# Patient Record
Sex: Male | Born: 1940 | Race: White | Hispanic: No | Smoking: Never smoker
Health system: Southern US, Community
[De-identification: ages and names within clinical notes are randomized; demographics above are authoritative.]

## PROBLEM LIST (undated history)

## (undated) DIAGNOSIS — A419 Sepsis, unspecified organism: Secondary | ICD-10-CM

## (undated) DIAGNOSIS — G309 Alzheimer's disease, unspecified: Secondary | ICD-10-CM

## (undated) DIAGNOSIS — Z86718 Personal history of other venous thrombosis and embolism: Secondary | ICD-10-CM

## (undated) DIAGNOSIS — F0280 Dementia in other diseases classified elsewhere without behavioral disturbance: Secondary | ICD-10-CM

## (undated) DIAGNOSIS — I700 Atherosclerosis of aorta: Secondary | ICD-10-CM

## (undated) DIAGNOSIS — K810 Acute cholecystitis: Secondary | ICD-10-CM

## (undated) DIAGNOSIS — M1990 Unspecified osteoarthritis, unspecified site: Secondary | ICD-10-CM

## (undated) DIAGNOSIS — G9341 Metabolic encephalopathy: Secondary | ICD-10-CM

## (undated) DIAGNOSIS — I10 Essential (primary) hypertension: Secondary | ICD-10-CM

## (undated) DIAGNOSIS — F419 Anxiety disorder, unspecified: Secondary | ICD-10-CM

## (undated) DIAGNOSIS — N189 Chronic kidney disease, unspecified: Secondary | ICD-10-CM

## (undated) HISTORY — DX: Dementia in other diseases classified elsewhere, unspecified severity, without behavioral disturbance, psychotic disturbance, mood disturbance, and anxiety: F02.80

## (undated) HISTORY — PX: FRACTURE SURGERY: SHX138

## (undated) HISTORY — DX: Alzheimer's disease, unspecified: G30.9

---

## 1983-02-01 HISTORY — PX: NEPHRECTOMY: SHX65

## 2003-02-10 ENCOUNTER — Ambulatory Visit (HOSPITAL_COMMUNITY): Admission: RE | Admit: 2003-02-10 | Discharge: 2003-02-10 | Payer: Self-pay | Admitting: Gastroenterology

## 2003-02-10 ENCOUNTER — Encounter (INDEPENDENT_AMBULATORY_CARE_PROVIDER_SITE_OTHER): Payer: Self-pay | Admitting: Specialist

## 2003-11-24 ENCOUNTER — Ambulatory Visit (HOSPITAL_COMMUNITY): Admission: RE | Admit: 2003-11-24 | Discharge: 2003-11-24 | Payer: Self-pay | Admitting: Chiropractic Medicine

## 2005-06-15 ENCOUNTER — Encounter: Admission: RE | Admit: 2005-06-15 | Discharge: 2005-06-15 | Payer: Self-pay | Admitting: Specialist

## 2005-06-29 ENCOUNTER — Encounter: Admission: RE | Admit: 2005-06-29 | Discharge: 2005-06-29 | Payer: Self-pay | Admitting: Specialist

## 2005-07-18 ENCOUNTER — Encounter: Admission: RE | Admit: 2005-07-18 | Discharge: 2005-07-18 | Payer: Self-pay | Admitting: Specialist

## 2005-09-02 ENCOUNTER — Encounter: Admission: RE | Admit: 2005-09-02 | Discharge: 2005-09-02 | Payer: Self-pay | Admitting: Specialist

## 2008-09-01 ENCOUNTER — Encounter: Admission: RE | Admit: 2008-09-01 | Discharge: 2008-09-01 | Payer: Self-pay | Admitting: Chiropractic Medicine

## 2010-06-18 NOTE — Op Note (Signed)
NAME:  Robert Le, Robert Le                       ACCOUNT NO.:  [id number]   MEDICAL RECORD NO.:  [id number]                   PATIENT TYPE:  AMB   LOCATION:  ENDO                                 FACILITY:  [facility]   PHYSICIAN:  Danise Edge, M.D.                DATE OF BIRTH:  07-Sep-1940   DATE OF PROCEDURE:  02/10/2003  DATE OF DISCHARGE:                                 OPERATIVE REPORT   PROCEDURE:  Colonoscopy and polypectomy.   PROCEDURE INDICATION:  [name] is a 70 year old male scheduled  to undergo his first screening colonoscopy with polypectomy to prevent colon  cancer.   ENDOSCOPIST:  Danise Edge, M.D.   PREMEDICATION:  Versed 5 mg, Demerol 50 mg.   DESCRIPTION OF PROCEDURE:  After obtaining informed consent, Mr. Koral was  placed in the left lateral decubitus position.  I administered intravenous  Demerol and intravenous Versed to achieve conscious sedation for the  procedure.  The patient's oxygen saturation and cardiac rhythm were  monitored throughout the procedure and documented in the medical record.   Anal inspection was normal.  Digital rectal exam revealed a non-nodular  prostate.  The Olympus adjustable pediatric colonoscope was introduced into  the rectum and advanced to the cecum.  Colonic preparation for the exam  today was excellent.   Rectum normal.   Sigmoid colon and descending colon:  At approximately 65 cm from the anal  verge a 2 mm sessile polyp was removed with the electrocautery snare and  submitted for pathologic interpretation.   Splenic flexure normal.   Transverse colon normal.   Hepatic flexure normal.   Ascending colon normal.  Cecum and ileocecal valve normal.   Distal ileum normal.   ASSESSMENT:  A small polyp was removed from the left colon at approximately  65 cm from the anal verge; otherwise normal screening proctocolonoscopy to  the cecum.   RECOMMENDATIONS:  Repeat colonoscopy in five years if polyp  returns  neoplastic pathologically.                                               Danise Edge, M.D.    MJ/MEDQ  D:  02/10/2003  T:  02/10/2003  Job:  [id number]

## 2011-06-08 DIAGNOSIS — J309 Allergic rhinitis, unspecified: Secondary | ICD-10-CM | POA: Diagnosis not present

## 2011-07-25 DIAGNOSIS — Z1331 Encounter for screening for depression: Secondary | ICD-10-CM | POA: Diagnosis not present

## 2011-07-25 DIAGNOSIS — I10 Essential (primary) hypertension: Secondary | ICD-10-CM | POA: Diagnosis not present

## 2011-07-25 DIAGNOSIS — Z0000 Encounter for general adult medical examination without abnormal findings: Secondary | ICD-10-CM | POA: Diagnosis not present

## 2011-09-16 DIAGNOSIS — M62838 Other muscle spasm: Secondary | ICD-10-CM | POA: Diagnosis not present

## 2011-09-16 DIAGNOSIS — M999 Biomechanical lesion, unspecified: Secondary | ICD-10-CM | POA: Diagnosis not present

## 2011-09-16 DIAGNOSIS — M461 Sacroiliitis, not elsewhere classified: Secondary | ICD-10-CM | POA: Diagnosis not present

## 2011-09-16 DIAGNOSIS — M5380 Other specified dorsopathies, site unspecified: Secondary | ICD-10-CM | POA: Diagnosis not present

## 2011-09-19 DIAGNOSIS — M5380 Other specified dorsopathies, site unspecified: Secondary | ICD-10-CM | POA: Diagnosis not present

## 2011-09-19 DIAGNOSIS — M461 Sacroiliitis, not elsewhere classified: Secondary | ICD-10-CM | POA: Diagnosis not present

## 2011-09-19 DIAGNOSIS — M62838 Other muscle spasm: Secondary | ICD-10-CM | POA: Diagnosis not present

## 2011-09-19 DIAGNOSIS — M999 Biomechanical lesion, unspecified: Secondary | ICD-10-CM | POA: Diagnosis not present

## 2011-10-24 DIAGNOSIS — Z23 Encounter for immunization: Secondary | ICD-10-CM | POA: Diagnosis not present

## 2011-11-15 DIAGNOSIS — H5200 Hypermetropia, unspecified eye: Secondary | ICD-10-CM | POA: Diagnosis not present

## 2011-11-15 DIAGNOSIS — D3130 Benign neoplasm of unspecified choroid: Secondary | ICD-10-CM | POA: Diagnosis not present

## 2011-11-15 DIAGNOSIS — H524 Presbyopia: Secondary | ICD-10-CM | POA: Diagnosis not present

## 2011-11-15 DIAGNOSIS — H2510 Age-related nuclear cataract, unspecified eye: Secondary | ICD-10-CM | POA: Diagnosis not present

## 2012-01-02 DIAGNOSIS — M25559 Pain in unspecified hip: Secondary | ICD-10-CM | POA: Diagnosis not present

## 2012-01-02 DIAGNOSIS — R011 Cardiac murmur, unspecified: Secondary | ICD-10-CM | POA: Diagnosis not present

## 2012-01-02 DIAGNOSIS — I10 Essential (primary) hypertension: Secondary | ICD-10-CM | POA: Diagnosis not present

## 2012-01-04 DIAGNOSIS — R011 Cardiac murmur, unspecified: Secondary | ICD-10-CM | POA: Diagnosis not present

## 2012-01-04 DIAGNOSIS — I10 Essential (primary) hypertension: Secondary | ICD-10-CM | POA: Diagnosis not present

## 2012-01-04 DIAGNOSIS — M25559 Pain in unspecified hip: Secondary | ICD-10-CM | POA: Diagnosis not present

## 2012-03-26 DIAGNOSIS — H8110 Benign paroxysmal vertigo, unspecified ear: Secondary | ICD-10-CM | POA: Diagnosis not present

## 2012-07-02 DIAGNOSIS — M25559 Pain in unspecified hip: Secondary | ICD-10-CM | POA: Diagnosis not present

## 2012-07-02 DIAGNOSIS — I10 Essential (primary) hypertension: Secondary | ICD-10-CM | POA: Diagnosis not present

## 2012-07-09 DIAGNOSIS — M549 Dorsalgia, unspecified: Secondary | ICD-10-CM | POA: Diagnosis not present

## 2012-07-09 DIAGNOSIS — M169 Osteoarthritis of hip, unspecified: Secondary | ICD-10-CM | POA: Diagnosis not present

## 2012-07-09 DIAGNOSIS — M5137 Other intervertebral disc degeneration, lumbosacral region: Secondary | ICD-10-CM | POA: Diagnosis not present

## 2012-07-17 DIAGNOSIS — M25559 Pain in unspecified hip: Secondary | ICD-10-CM | POA: Diagnosis not present

## 2012-07-17 DIAGNOSIS — M545 Low back pain: Secondary | ICD-10-CM | POA: Diagnosis not present

## 2012-07-19 DIAGNOSIS — H52229 Regular astigmatism, unspecified eye: Secondary | ICD-10-CM | POA: Diagnosis not present

## 2012-07-19 DIAGNOSIS — H2510 Age-related nuclear cataract, unspecified eye: Secondary | ICD-10-CM | POA: Diagnosis not present

## 2012-07-19 DIAGNOSIS — H5200 Hypermetropia, unspecified eye: Secondary | ICD-10-CM | POA: Diagnosis not present

## 2012-07-19 DIAGNOSIS — D3130 Benign neoplasm of unspecified choroid: Secondary | ICD-10-CM | POA: Diagnosis not present

## 2012-07-25 DIAGNOSIS — M545 Low back pain: Secondary | ICD-10-CM | POA: Diagnosis not present

## 2012-07-25 DIAGNOSIS — M25559 Pain in unspecified hip: Secondary | ICD-10-CM | POA: Diagnosis not present

## 2012-08-01 DIAGNOSIS — M25559 Pain in unspecified hip: Secondary | ICD-10-CM | POA: Diagnosis not present

## 2012-08-01 DIAGNOSIS — M545 Low back pain: Secondary | ICD-10-CM | POA: Diagnosis not present

## 2012-08-08 DIAGNOSIS — M545 Low back pain: Secondary | ICD-10-CM | POA: Diagnosis not present

## 2012-08-08 DIAGNOSIS — M549 Dorsalgia, unspecified: Secondary | ICD-10-CM | POA: Diagnosis not present

## 2012-08-21 DIAGNOSIS — M25559 Pain in unspecified hip: Secondary | ICD-10-CM | POA: Diagnosis not present

## 2012-08-21 DIAGNOSIS — M545 Low back pain: Secondary | ICD-10-CM | POA: Diagnosis not present

## 2012-09-24 DIAGNOSIS — Z23 Encounter for immunization: Secondary | ICD-10-CM | POA: Diagnosis not present

## 2012-11-02 DIAGNOSIS — R109 Unspecified abdominal pain: Secondary | ICD-10-CM | POA: Diagnosis not present

## 2012-12-20 DIAGNOSIS — M169 Osteoarthritis of hip, unspecified: Secondary | ICD-10-CM | POA: Diagnosis not present

## 2013-01-03 DIAGNOSIS — I10 Essential (primary) hypertension: Secondary | ICD-10-CM | POA: Diagnosis not present

## 2013-01-03 DIAGNOSIS — M25559 Pain in unspecified hip: Secondary | ICD-10-CM | POA: Diagnosis not present

## 2013-01-03 DIAGNOSIS — Z1331 Encounter for screening for depression: Secondary | ICD-10-CM | POA: Diagnosis not present

## 2013-01-09 DIAGNOSIS — M76899 Other specified enthesopathies of unspecified lower limb, excluding foot: Secondary | ICD-10-CM | POA: Diagnosis not present

## 2013-01-09 DIAGNOSIS — IMO0002 Reserved for concepts with insufficient information to code with codable children: Secondary | ICD-10-CM | POA: Diagnosis not present

## 2013-01-17 DIAGNOSIS — IMO0002 Reserved for concepts with insufficient information to code with codable children: Secondary | ICD-10-CM | POA: Diagnosis not present

## 2013-01-17 DIAGNOSIS — M76899 Other specified enthesopathies of unspecified lower limb, excluding foot: Secondary | ICD-10-CM | POA: Diagnosis not present

## 2013-01-21 DIAGNOSIS — IMO0002 Reserved for concepts with insufficient information to code with codable children: Secondary | ICD-10-CM | POA: Diagnosis not present

## 2013-01-21 DIAGNOSIS — M76899 Other specified enthesopathies of unspecified lower limb, excluding foot: Secondary | ICD-10-CM | POA: Diagnosis not present

## 2013-02-06 DIAGNOSIS — M76899 Other specified enthesopathies of unspecified lower limb, excluding foot: Secondary | ICD-10-CM | POA: Diagnosis not present

## 2013-02-06 DIAGNOSIS — IMO0002 Reserved for concepts with insufficient information to code with codable children: Secondary | ICD-10-CM | POA: Diagnosis not present

## 2013-02-13 DIAGNOSIS — M76899 Other specified enthesopathies of unspecified lower limb, excluding foot: Secondary | ICD-10-CM | POA: Diagnosis not present

## 2013-02-13 DIAGNOSIS — IMO0002 Reserved for concepts with insufficient information to code with codable children: Secondary | ICD-10-CM | POA: Diagnosis not present

## 2013-02-18 DIAGNOSIS — IMO0002 Reserved for concepts with insufficient information to code with codable children: Secondary | ICD-10-CM | POA: Diagnosis not present

## 2013-02-18 DIAGNOSIS — M76899 Other specified enthesopathies of unspecified lower limb, excluding foot: Secondary | ICD-10-CM | POA: Diagnosis not present

## 2013-02-25 DIAGNOSIS — IMO0002 Reserved for concepts with insufficient information to code with codable children: Secondary | ICD-10-CM | POA: Diagnosis not present

## 2013-02-25 DIAGNOSIS — M76899 Other specified enthesopathies of unspecified lower limb, excluding foot: Secondary | ICD-10-CM | POA: Diagnosis not present

## 2013-05-08 DIAGNOSIS — H2510 Age-related nuclear cataract, unspecified eye: Secondary | ICD-10-CM | POA: Diagnosis not present

## 2013-05-08 DIAGNOSIS — H11159 Pinguecula, unspecified eye: Secondary | ICD-10-CM | POA: Diagnosis not present

## 2013-05-08 DIAGNOSIS — H18519 Endothelial corneal dystrophy, unspecified eye: Secondary | ICD-10-CM | POA: Diagnosis not present

## 2013-05-08 DIAGNOSIS — D3130 Benign neoplasm of unspecified choroid: Secondary | ICD-10-CM | POA: Diagnosis not present

## 2013-07-04 DIAGNOSIS — Z125 Encounter for screening for malignant neoplasm of prostate: Secondary | ICD-10-CM | POA: Diagnosis not present

## 2013-07-04 DIAGNOSIS — Z0000 Encounter for general adult medical examination without abnormal findings: Secondary | ICD-10-CM | POA: Diagnosis not present

## 2013-07-04 DIAGNOSIS — Z1331 Encounter for screening for depression: Secondary | ICD-10-CM | POA: Diagnosis not present

## 2013-07-04 DIAGNOSIS — I10 Essential (primary) hypertension: Secondary | ICD-10-CM | POA: Diagnosis not present

## 2013-07-04 DIAGNOSIS — M1610 Unilateral primary osteoarthritis, unspecified hip: Secondary | ICD-10-CM | POA: Diagnosis not present

## 2013-07-04 DIAGNOSIS — M169 Osteoarthritis of hip, unspecified: Secondary | ICD-10-CM | POA: Diagnosis not present

## 2013-07-04 DIAGNOSIS — D126 Benign neoplasm of colon, unspecified: Secondary | ICD-10-CM | POA: Diagnosis not present

## 2013-07-24 DIAGNOSIS — M1610 Unilateral primary osteoarthritis, unspecified hip: Secondary | ICD-10-CM | POA: Diagnosis not present

## 2013-07-24 DIAGNOSIS — M25559 Pain in unspecified hip: Secondary | ICD-10-CM | POA: Diagnosis not present

## 2013-08-05 ENCOUNTER — Encounter (HOSPITAL_COMMUNITY): Payer: Self-pay | Admitting: Pharmacy Technician

## 2013-08-12 ENCOUNTER — Other Ambulatory Visit (HOSPITAL_COMMUNITY): Payer: Self-pay | Admitting: *Deleted

## 2013-08-12 NOTE — Patient Instructions (Addendum)
Robert Le  08/12/2013                           YOUR PROCEDURE IS SCHEDULED ON: 08/20/13 AT 8:40 AM               ENTER [facility] ENTRANCE AND                           FOLLOW  SIGNS TO SHORT STAY CENTER                 ARRIVE AT SHORT STAY AT:  5:45 AM               CALL THIS NUMBER IF ANY PROBLEMS THE DAY OF SURGERY :               [phone]                                REMEMBER:   Do not eat food or drink liquids AFTER MIDNIGHT                 Take these medicines the morning of surgery with               A SIPS OF WATER : AMLODIPINE / CLONAZEPAM        Do not wear jewelry, make-up   Do not wear lotions, powders, or perfumes.   Do not shave legs or underarms 12 hrs. before surgery (men may shave face)  Do not bring valuables to the hospital.  Contacts, dentures or bridgework may not be worn into surgery.  Leave suitcase in the car. After surgery it may be brought to your room.  For patients admitted to the hospital more than one night, checkout time is            11:00 AM                                                       The day of discharge.   Patients discharged the day of surgery will not be allowed to drive home.            If going home same day of surgery, must have someone stay with you              FIRST 24 hrs at home and arrange for some one to drive you              home from hospital.   ________________________________________________________________________                                                     ________________________________________________________________________  Robert Le  Before surgery, you can play an important role.  Because skin is not sterile, your skin needs to be as free of germs as possible.  You can reduce the number of germs on your skin by washing with CHG (chlorahexidine  gluconate) soap before surgery.  CHG is an antiseptic cleaner which kills germs and bonds with the skin to continue killing germs even after washing. Please DO NOT use if you have an allergy to CHG or antibacterial soaps.  If your skin becomes reddened/irritated stop using the CHG and inform your nurse when you arrive at Short Stay. Do not shave (including legs and underarms) for at least 48 hours prior to the first CHG shower.  You may shave your face. Please follow these instructions carefully:   1.  Shower with CHG Soap the night before surgery and the  morning of Surgery.   2.  If you choose to wash your hair, wash your hair first as usual with your  normal  Shampoo.   3.  After you shampoo, rinse your hair and body thoroughly to remove the  shampoo.                                         4.  Use CHG as you would any other liquid soap.  You can apply chg directly  to the skin and wash . Gently wash with scrungie or clean wascloth    5.  Apply the CHG Soap to your body ONLY FROM THE NECK DOWN.   Do not use on open                           Wound or open sores. Avoid contact with eyes, ears mouth and genitals (private parts).                        Genitals (private parts) with your normal soap.              6.  Wash thoroughly, paying special attention to the area where your surgery  will be performed.   7.  Thoroughly rinse your body with warm water from the neck down.   8.  DO NOT shower/wash with your normal soap after using and rinsing off  the CHG Soap .                9.  Pat yourself dry with a clean towel.             10.  Wear clean pajamas.             11.  Place clean sheets on your bed the night of your first shower and do not  sleep with pets.  Day of Surgery : Do not apply any lotions/deodorants the morning of surgery.  Please wear clean clothes to the hospital/surgery center.  FAILURE TO FOLLOW THESE INSTRUCTIONS MAY RESULT IN THE CANCELLATION OF YOUR  SURGERY    PATIENT SIGNATURE_________________________________  ______________________________________________________________________     Robert Le  An incentive spirometer is a tool that can help keep your lungs clear and active. This tool measures how well you are filling your lungs with each breath. Taking long deep breaths may help reverse or decrease the chance of developing breathing (pulmonary) problems (especially infection) following:  A long period of time when you are unable to move or be active. BEFORE THE PROCEDURE   If the spirometer includes an indicator to show your best effort, your nurse or respiratory therapist will set it to a desired goal.  If possible, sit up straight or lean slightly forward. Try not to slouch.  Hold the incentive spirometer in an upright position. INSTRUCTIONS FOR USE  1. Sit on the edge of your bed if possible, or sit up as far as you can in bed or on a chair. 2. Hold the incentive spirometer in an upright position. 3. Breathe out normally. 4. Place the mouthpiece in your mouth and seal your lips tightly around it. 5. Breathe in slowly and as deeply as possible, raising the piston or the ball toward the top of the column. 6. Hold your breath for 3-5 seconds or for as long as possible. Allow the piston or ball to fall to the bottom of the column. 7. Remove the mouthpiece from your mouth and breathe out normally. 8. Rest for a few seconds and repeat Steps 1 through 7 at least 10 times every 1-2 hours when you are awake. Take your time and take a few normal breaths between deep breaths. 9. The spirometer may include an indicator to show your best effort. Use the indicator as a goal to work toward during each repetition. 10. After each set of 10 deep breaths, practice coughing to be sure your lungs are clear. If you have an incision (the cut made at the time of surgery), support your incision when coughing by placing a pillow or rolled  up towels firmly against it. Once you are able to get out of bed, walk around indoors and cough well. You may stop using the incentive spirometer when instructed by your caregiver.  RISKS AND COMPLICATIONS  Take your time so you do not get dizzy or light-headed.  If you are in pain, you may need to take or ask for pain medication before doing incentive spirometry. It is harder to take a deep breath if you are having pain. AFTER USE  Rest and breathe slowly and easily.  It can be helpful to keep track of a log of your progress. Your caregiver can provide you with a simple table to help with this. If you are using the spirometer at home, follow these instructions: [place] IF:   You are having difficultly using the spirometer.  You have trouble using the spirometer as often as instructed.  Your pain medication is not giving enough relief while using the spirometer.  You develop fever of 100.5 F (38.1 C) or higher. SEEK IMMEDIATE MEDICAL CARE IF:   You cough up bloody sputum that had not been present before.  You develop fever of 102 F (38.9 C) or greater.  You develop worsening pain at or near the incision site. MAKE SURE YOU:   Understand these instructions.  Will watch your condition.  Will get help right away if you are not doing well or get worse. Document Released: 05/30/2006 Document Revised: 04/11/2011 Document Reviewed: 07/31/2006 [employer] Patient Information 2014 [employer], [place].   ________________________________________________________________________  WHAT IS A BLOOD TRANSFUSION? Blood Transfusion Information  A transfusion is the replacement of blood or some of its parts. Blood is made up of multiple cells which provide different functions.  Red blood cells carry oxygen and are used for blood loss replacement.  White blood cells fight against infection.  Platelets control bleeding.  Plasma  helps clot blood.  Other blood products are  available for specialized needs, such as hemophilia or other clotting disorders. BEFORE THE TRANSFUSION  Who gives blood for transfusions?   Healthy volunteers who are fully evaluated to make sure their blood is safe. This is blood bank blood. Transfusion therapy is the safest it has ever been in the practice of medicine. Before blood is taken from a donor, a complete history is taken to make sure that person has no history of diseases nor engages in risky social behavior (examples are intravenous drug use or sexual activity with multiple partners). The donor's travel history is screened to minimize risk of transmitting infections, such as malaria. The donated blood is tested for signs of infectious diseases, such as HIV and hepatitis. The blood is then tested to be sure it is compatible with you in order to minimize the chance of a transfusion reaction. If you or a relative donates blood, this is often done in anticipation of surgery and is not appropriate for emergency situations. It takes many days to process the donated blood. RISKS AND COMPLICATIONS Although transfusion therapy is very safe and saves many lives, the main dangers of transfusion include:   Getting an infectious disease.  Developing a transfusion reaction. This is an allergic reaction to something in the blood you were given. Every precaution is taken to prevent this. The decision to have a blood transfusion has been considered carefully by your caregiver before blood is given. Blood is not given unless the benefits outweigh the risks. AFTER THE TRANSFUSION  Right after receiving a blood transfusion, you will usually feel much better and more energetic. This is especially true if your red blood cells have gotten low (anemic). The transfusion raises the level of the red blood cells which carry oxygen, and this usually causes an energy increase.  The nurse administering the transfusion will monitor you carefully for  complications. HOME CARE INSTRUCTIONS  No special instructions are needed after a transfusion. You may find your energy is better. Speak with your caregiver about any limitations on activity for underlying diseases you may have. SEEK MEDICAL CARE IF:   Your condition is not improving after your transfusion.  You develop redness or irritation at the intravenous (IV) site. SEEK IMMEDIATE MEDICAL CARE IF:  Any of the following symptoms occur over the next 12 hours:  Shaking chills.  You have a temperature by mouth above 102 F (38.9 C), not controlled by medicine.  Chest, back, or muscle pain.  People around you feel you are not acting correctly or are confused.  Shortness of breath or difficulty breathing.  Dizziness and fainting.  You get a rash or develop hives.  You have a decrease in urine output.  Your urine turns a dark color or changes to pink, red, or brown. Any of the following symptoms occur over the next 10 days:  You have a temperature by mouth above 102 F (38.9 C), not controlled by medicine.  Shortness of breath.  Weakness after normal activity.  The white part of the eye turns yellow (jaundice).  You have a decrease in the amount of urine or are urinating less often.  Your urine turns a dark color or changes to pink, red, or brown. Document Released: 01/15/2000 Document Revised: 04/11/2011 Document Reviewed: 09/03/2007 [facility] Patient Information 2014 [place], [place].  _______________________________________________________________________

## 2013-08-13 ENCOUNTER — Other Ambulatory Visit: Payer: Self-pay

## 2013-08-13 ENCOUNTER — Ambulatory Visit (HOSPITAL_COMMUNITY)
Admission: RE | Admit: 2013-08-13 | Discharge: 2013-08-13 | Disposition: A | Discharge status: Home / Self Care | Payer: Medicare Other | Source: Ambulatory Visit | Attending: Anesthesiology | Admitting: Anesthesiology

## 2013-08-13 ENCOUNTER — Encounter (HOSPITAL_COMMUNITY)
Admission: RE | Admit: 2013-08-13 | Discharge: 2013-08-13 | Disposition: A | Discharge status: Home / Self Care | Payer: Medicare Other | Source: Ambulatory Visit | Attending: Orthopedic Surgery | Admitting: Orthopedic Surgery

## 2013-08-13 ENCOUNTER — Encounter (HOSPITAL_COMMUNITY): Payer: Self-pay

## 2013-08-13 DIAGNOSIS — J40 Bronchitis, not specified as acute or chronic: Secondary | ICD-10-CM | POA: Diagnosis not present

## 2013-08-13 DIAGNOSIS — Z01812 Encounter for preprocedural laboratory examination: Secondary | ICD-10-CM | POA: Diagnosis not present

## 2013-08-13 DIAGNOSIS — Q676 Pectus excavatum: Secondary | ICD-10-CM | POA: Insufficient documentation

## 2013-08-13 DIAGNOSIS — Z01818 Encounter for other preprocedural examination: Secondary | ICD-10-CM | POA: Diagnosis not present

## 2013-08-13 DIAGNOSIS — I10 Essential (primary) hypertension: Secondary | ICD-10-CM | POA: Insufficient documentation

## 2013-08-13 DIAGNOSIS — Z01810 Encounter for preprocedural cardiovascular examination: Secondary | ICD-10-CM | POA: Insufficient documentation

## 2013-08-13 HISTORY — DX: Unspecified osteoarthritis, unspecified site: M19.90

## 2013-08-13 HISTORY — DX: Essential (primary) hypertension: I10

## 2013-08-13 HISTORY — DX: Anxiety disorder, unspecified: F41.9

## 2013-08-13 HISTORY — DX: Chronic kidney disease, unspecified: N18.9

## 2013-08-13 LAB — BASIC METABOLIC PANEL
Anion gap: 11 (ref 5–15)
BUN: 15 mg/dL (ref 6–23)
CO2: 29 meq/L (ref 19–32)
Calcium: 9.8 mg/dL (ref 8.4–10.5)
Chloride: 99 mEq/L (ref 96–112)
Creatinine, Ser: 1.06 mg/dL (ref 0.50–1.35)
GFR calc Af Amer: 77 mL/min — ABNORMAL LOW (ref >90)
GFR, EST NON AFRICAN AMERICAN: 67 mL/min — AB (ref >90)
GLUCOSE: 93 mg/dL (ref 70–99)
Potassium: 4.9 mEq/L (ref 3.7–5.3)
SODIUM: 139 meq/L (ref 137–147)

## 2013-08-13 LAB — CBC
HEMATOCRIT: 49 % (ref 39.0–52.0)
Hemoglobin: 16.8 g/dL (ref 13.0–17.0)
MCH: 31 pg (ref 26.0–34.0)
MCHC: 34.3 g/dL (ref 30.0–36.0)
MCV: 90.4 fL (ref 78.0–100.0)
Platelets: 239 10*3/uL (ref 150–400)
RBC: 5.42 MIL/uL (ref 4.22–5.81)
RDW: 13.1 % (ref 11.5–15.5)
WBC: 6.6 10*3/uL (ref 4.0–10.5)

## 2013-08-13 LAB — URINALYSIS, ROUTINE W REFLEX MICROSCOPIC
Bilirubin Urine: NEGATIVE
GLUCOSE, UA: NEGATIVE mg/dL
Hgb urine dipstick: NEGATIVE
Ketones, ur: NEGATIVE mg/dL
LEUKOCYTES UA: NEGATIVE
NITRITE: NEGATIVE
PROTEIN: NEGATIVE mg/dL
Specific Gravity, Urine: 1.018 (ref 1.005–1.030)
UROBILINOGEN UA: 1 mg/dL (ref 0.0–1.0)
pH: 6.5 (ref 5.0–8.0)

## 2013-08-13 LAB — SURGICAL PCR SCREEN
MRSA, PCR: NEGATIVE
Staphylococcus aureus: NEGATIVE

## 2013-08-13 LAB — PROTIME-INR
INR: 1.03 (ref 0.00–1.49)
Prothrombin Time: 13.5 seconds (ref 11.6–15.2)

## 2013-08-13 LAB — APTT: aPTT: 32 seconds (ref 24–37)

## 2013-08-14 NOTE — H&P (Signed)
TOTAL HIP ADMISSION H&P  Patient is admitted for left total hip arthroplasty, anterior approach.  Subjective:  Chief Complaint:    Left hip OA / pain  HPI: RASHED EDLER, 73 y.o. male, has a history of pain and functional disability in the left hip(s) due to arthritis and patient has failed non-surgical conservative treatments for greater than 12 weeks to include NSAID's and/or analgesics, corticosteriod injections and activity modification.  Onset of symptoms was gradual starting 1+ years ago with gradually worsening course since that time.The patient noted no past surgery on the left hip(s).  Patient currently rates pain in the left hip at 7 out of 10 with activity. Patient has worsening of pain with activity and weight bearing, trendelenberg gait, pain that interfers with activities of daily living and pain with passive range of motion. Patient has evidence of periarticular osteophytes and joint space narrowing by imaging studies. This condition presents safety issues increasing the risk of falls.  There is no current active infection.  Risks, benefits and expectations were discussed with the patient.  Risks including but not limited to the risk of anesthesia, blood clots, nerve damage, blood vessel damage, failure of the prosthesis, infection and up to and including death.  Patient understand the risks, benefits and expectations and wishes to proceed with surgery.   PCP: No primary provider on file.  D/C Plans:       SNF - [facility]  Post-op Meds:       No Rx given   Tranexamic Acid:      To be given - IV    Decadron:      To be given  FYI:     ASA post-op   Norco post-op    Past Medical History  Diagnosis Date  . Hypertension   . Arthritis   . Chronic kidney disease     L TOTAL NEPHRECTOMY  . Anxiety     Past Surgical History  Procedure Laterality Date  . 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 Fracture surgery      FRACTURED HIP SURGERY 1960     No prescriptions prior to admission   No Known Allergies   History  Substance Use Topics  . Smoking status: Never Smoker   . Smokeless tobacco: Not on file  . Alcohol Use: No       Review of Systems  Constitutional: Negative.   HENT: Negative.   Eyes: Negative.   Respiratory: Negative.   Cardiovascular: Negative.   Gastrointestinal: Negative.   Genitourinary: Negative.   Musculoskeletal: Positive for back pain and joint pain.  Skin: Negative.   Neurological: Negative.   Endo/Heme/Allergies: Negative.   Psychiatric/Behavioral: Positive for memory loss. The patient is nervous/anxious.     Objective:  Physical Exam  Constitutional: He is oriented to person, place, and time. He appears well-developed and well-nourished.  HENT:  Head: Normocephalic and atraumatic.  Mouth/Throat: Oropharynx is clear and moist.  Eyes: Pupils are equal, round, and reactive to light.  Neck: Neck supple. No JVD present. No tracheal deviation present. No thyromegaly present.  Cardiovascular: Normal rate, regular rhythm, normal heart sounds and intact distal pulses.   Respiratory: Effort normal and breath sounds normal. No respiratory distress. He has no wheezes.  GI: Soft. There is no tenderness. There is no guarding.  Musculoskeletal:       Left hip: He exhibits decreased range of motion, decreased strength, tenderness and bony tenderness. He exhibits  no swelling, no deformity and no laceration.  Lymphadenopathy:    He has no cervical adenopathy.  Neurological: He is alert and oriented to person, place, and time.  Skin: Skin is warm and dry.  Psychiatric: He has a normal mood and affect.      Imaging Review Plain radiographs demonstrate severe degenerative joint disease of the left hip(s). The bone quality appears to be good for age and reported activity level.  Assessment/Plan:  End stage arthritis, left hip(s)  The patient history, physical examination, clinical judgement of the  provider and imaging studies are consistent with end stage degenerative joint disease of the left hip(s) and total hip arthroplasty is deemed medically necessary. The treatment options including medical management, injection therapy, arthroscopy and arthroplasty were discussed at length. The risks and benefits of total hip arthroplasty were presented and reviewed. The risks due to aseptic loosening, infection, stiffness, dislocation/subluxation,  thromboembolic complications and other imponderables were discussed.  The patient acknowledged the explanation, agreed to proceed with the plan and consent was signed. Patient is being admitted for inpatient treatment for surgery, pain control, PT, OT, prophylactic antibiotics, VTE prophylaxis, progressive ambulation and ADL's and discharge planning.The patient is planning to be discharged to skilled nursing facility.     [place] P[place] President   PA-C  08/14/2013, 12:49 PM

## 2013-08-20 ENCOUNTER — Inpatient Hospital Stay (HOSPITAL_COMMUNITY)
Admission: RE | Admit: 2013-08-20 | Discharge: 2013-08-22 | DRG: 470 | Disposition: A | Discharge status: Skilled Nursing Facility | Payer: Medicare Other | Source: Ambulatory Visit | Attending: Orthopedic Surgery | Admitting: Orthopedic Surgery

## 2013-08-20 ENCOUNTER — Inpatient Hospital Stay (HOSPITAL_COMMUNITY): Payer: Medicare Other

## 2013-08-20 ENCOUNTER — Encounter (HOSPITAL_COMMUNITY): Payer: Medicare Other | Admitting: *Deleted

## 2013-08-20 ENCOUNTER — Encounter (HOSPITAL_COMMUNITY): Payer: Self-pay | Admitting: *Deleted

## 2013-08-20 ENCOUNTER — Inpatient Hospital Stay (HOSPITAL_COMMUNITY): Payer: Medicare Other | Admitting: *Deleted

## 2013-08-20 ENCOUNTER — Encounter (HOSPITAL_COMMUNITY)
Admission: RE | Disposition: A | Discharge status: Skilled Nursing Facility | Payer: Self-pay | Source: Ambulatory Visit | Attending: Orthopedic Surgery

## 2013-08-20 DIAGNOSIS — M25559 Pain in unspecified hip: Secondary | ICD-10-CM | POA: Diagnosis not present

## 2013-08-20 DIAGNOSIS — M129 Arthropathy, unspecified: Secondary | ICD-10-CM | POA: Diagnosis not present

## 2013-08-20 DIAGNOSIS — M169 Osteoarthritis of hip, unspecified: Secondary | ICD-10-CM | POA: Diagnosis present

## 2013-08-20 DIAGNOSIS — M1610 Unilateral primary osteoarthritis, unspecified hip: Secondary | ICD-10-CM | POA: Diagnosis not present

## 2013-08-20 DIAGNOSIS — I129 Hypertensive chronic kidney disease with stage 1 through stage 4 chronic kidney disease, or unspecified chronic kidney disease: Secondary | ICD-10-CM | POA: Diagnosis present

## 2013-08-20 DIAGNOSIS — Z905 Acquired absence of kidney: Secondary | ICD-10-CM | POA: Diagnosis not present

## 2013-08-20 DIAGNOSIS — D62 Acute posthemorrhagic anemia: Secondary | ICD-10-CM | POA: Diagnosis not present

## 2013-08-20 DIAGNOSIS — M6281 Muscle weakness (generalized): Secondary | ICD-10-CM | POA: Diagnosis not present

## 2013-08-20 DIAGNOSIS — R262 Difficulty in walking, not elsewhere classified: Secondary | ICD-10-CM | POA: Diagnosis not present

## 2013-08-20 DIAGNOSIS — N189 Chronic kidney disease, unspecified: Secondary | ICD-10-CM | POA: Diagnosis present

## 2013-08-20 DIAGNOSIS — F411 Generalized anxiety disorder: Secondary | ICD-10-CM | POA: Diagnosis not present

## 2013-08-20 DIAGNOSIS — Z96642 Presence of left artificial hip joint: Secondary | ICD-10-CM

## 2013-08-20 DIAGNOSIS — Z96649 Presence of unspecified artificial hip joint: Secondary | ICD-10-CM | POA: Diagnosis not present

## 2013-08-20 DIAGNOSIS — M1990 Unspecified osteoarthritis, unspecified site: Secondary | ICD-10-CM | POA: Diagnosis not present

## 2013-08-20 DIAGNOSIS — Z471 Aftercare following joint replacement surgery: Secondary | ICD-10-CM | POA: Diagnosis not present

## 2013-08-20 HISTORY — PX: TOTAL HIP ARTHROPLASTY: SHX124

## 2013-08-20 LAB — TYPE AND SCREEN
ABO/RH(D): O POS
Antibody Screen: NEGATIVE

## 2013-08-20 LAB — ABO/RH: ABO/RH(D): O POS

## 2013-08-20 SURGERY — ARTHROPLASTY, HIP, TOTAL, ANTERIOR APPROACH
Anesthesia: Spinal | Site: Hip | Laterality: Left

## 2013-08-20 MED ORDER — CLONAZEPAM 0.5 MG PO TABS: 0.5000 mg | ORAL_TABLET | Freq: Two times a day (BID) | ORAL | Status: DC | PRN

## 2013-08-20 MED ORDER — MIDAZOLAM HCL 5 MG/5ML IJ SOLN
INTRAMUSCULAR | Status: DC | PRN
  Administered 2013-08-20: 2 mg via INTRAVENOUS

## 2013-08-20 MED ORDER — LIDOCAINE HCL (CARDIAC) 20 MG/ML IV SOLN
INTRAVENOUS | Status: AC
  Filled 2013-08-20: qty 5

## 2013-08-20 MED ORDER — HYDROMORPHONE HCL PF 1 MG/ML IJ SOLN: 0.2500 mg | INTRAMUSCULAR | Status: DC | PRN

## 2013-08-20 MED ORDER — DEXAMETHASONE SODIUM PHOSPHATE 10 MG/ML IJ SOLN
10.0000 mg | Freq: Once | INTRAMUSCULAR | Status: AC
  Administered 2013-08-20: 10 mg via INTRAVENOUS

## 2013-08-20 MED ORDER — DOCUSATE SODIUM 100 MG PO CAPS
100.0000 mg | ORAL_CAPSULE | Freq: Two times a day (BID) | ORAL | Status: DC
  Administered 2013-08-21 – 2013-08-22 (×4): 100 mg via ORAL

## 2013-08-20 MED ORDER — MENTHOL 3 MG MT LOZG
1.0000 | LOZENGE | OROMUCOSAL | Status: DC | PRN
  Filled 2013-08-20: qty 9

## 2013-08-20 MED ORDER — METOCLOPRAMIDE HCL 5 MG/ML IJ SOLN: 5.0000 mg | Freq: Three times a day (TID) | INTRAMUSCULAR | Status: DC | PRN

## 2013-08-20 MED ORDER — MAGNESIUM CITRATE PO SOLN: 1.0000 | Freq: Once | ORAL | Status: AC | PRN

## 2013-08-20 MED ORDER — PROMETHAZINE HCL 25 MG/ML IJ SOLN: 6.2500 mg | INTRAMUSCULAR | Status: DC | PRN

## 2013-08-20 MED ORDER — AMLODIPINE BESYLATE 5 MG PO TABS
5.0000 mg | ORAL_TABLET | Freq: Every morning | ORAL | Status: DC
  Administered 2013-08-21 – 2013-08-22 (×2): 5 mg via ORAL
  Filled 2013-08-20 (×2): qty 1

## 2013-08-20 MED ORDER — PHENOL 1.4 % MT LIQD
1.0000 | OROMUCOSAL | Status: DC | PRN
  Filled 2013-08-20: qty 177

## 2013-08-20 MED ORDER — EPHEDRINE SULFATE 50 MG/ML IJ SOLN
INTRAMUSCULAR | Status: DC | PRN
  Administered 2013-08-20: 10 mg via INTRAVENOUS

## 2013-08-20 MED ORDER — FENTANYL CITRATE 0.05 MG/ML IJ SOLN
INTRAMUSCULAR | Status: AC
  Filled 2013-08-20: qty 2

## 2013-08-20 MED ORDER — HYDROMORPHONE HCL PF 1 MG/ML IJ SOLN
0.5000 mg | INTRAMUSCULAR | Status: DC | PRN
  Administered 2013-08-20 – 2013-08-21 (×2): 1 mg via INTRAVENOUS
  Filled 2013-08-20 (×3): qty 1

## 2013-08-20 MED ORDER — MIDAZOLAM HCL 2 MG/2ML IJ SOLN
INTRAMUSCULAR | Status: AC
  Filled 2013-08-20: qty 2

## 2013-08-20 MED ORDER — METOCLOPRAMIDE HCL 10 MG PO TABS: 5.0000 mg | ORAL_TABLET | Freq: Three times a day (TID) | ORAL | Status: DC | PRN

## 2013-08-20 MED ORDER — PROPOFOL 10 MG/ML IV BOLUS
INTRAVENOUS | Status: AC
  Filled 2013-08-20: qty 20

## 2013-08-20 MED ORDER — CEFAZOLIN SODIUM-DEXTROSE 2-3 GM-% IV SOLR
2.0000 g | Freq: Four times a day (QID) | INTRAVENOUS | Status: AC
  Administered 2013-08-20 (×2): 2 g via INTRAVENOUS
  Filled 2013-08-20 (×2): qty 50

## 2013-08-20 MED ORDER — FERROUS SULFATE 325 (65 FE) MG PO TABS
325.0000 mg | ORAL_TABLET | Freq: Three times a day (TID) | ORAL | Status: DC
  Administered 2013-08-21 – 2013-08-22 (×4): 325 mg via ORAL
  Filled 2013-08-20 (×8): qty 1

## 2013-08-20 MED ORDER — CEFAZOLIN SODIUM-DEXTROSE 2-3 GM-% IV SOLR
2.0000 g | INTRAVENOUS | Status: AC
  Administered 2013-08-20: 2 g via INTRAVENOUS

## 2013-08-20 MED ORDER — ONDANSETRON HCL 4 MG/2ML IJ SOLN: 4.0000 mg | Freq: Four times a day (QID) | INTRAMUSCULAR | Status: DC | PRN

## 2013-08-20 MED ORDER — ACETAMINOPHEN 500 MG PO TABS
1000.0000 mg | ORAL_TABLET | Freq: Four times a day (QID) | ORAL | Status: AC
  Administered 2013-08-20 – 2013-08-21 (×4): 1000 mg via ORAL
  Filled 2013-08-20 (×4): qty 2

## 2013-08-20 MED ORDER — CEFAZOLIN SODIUM-DEXTROSE 2-3 GM-% IV SOLR
INTRAVENOUS | Status: AC
  Filled 2013-08-20: qty 50

## 2013-08-20 MED ORDER — FENTANYL CITRATE 0.05 MG/ML IJ SOLN
INTRAMUSCULAR | Status: DC | PRN
  Administered 2013-08-20: 75 ug via INTRAVENOUS
  Administered 2013-08-20: 25 ug via INTRAVENOUS

## 2013-08-20 MED ORDER — BISACODYL 10 MG RE SUPP: 10.0000 mg | Freq: Every day | RECTAL | Status: DC | PRN

## 2013-08-20 MED ORDER — METHOCARBAMOL 1000 MG/10ML IJ SOLN
500.0000 mg | Freq: Four times a day (QID) | INTRAVENOUS | Status: DC | PRN
  Administered 2013-08-20: 500 mg via INTRAVENOUS
  Filled 2013-08-20: qty 5

## 2013-08-20 MED ORDER — ACETAMINOPHEN 325 MG PO TABS
650.0000 mg | ORAL_TABLET | Freq: Four times a day (QID) | ORAL | Status: DC | PRN
  Administered 2013-08-21 – 2013-08-22 (×3): 650 mg via ORAL
  Filled 2013-08-20 (×2): qty 2

## 2013-08-20 MED ORDER — LIDOCAINE HCL (CARDIAC) 20 MG/ML IV SOLN
INTRAVENOUS | Status: DC | PRN
  Administered 2013-08-20: 50 mg via INTRAVENOUS

## 2013-08-20 MED ORDER — TRANEXAMIC ACID 100 MG/ML IV SOLN
1000.0000 mg | Freq: Once | INTRAVENOUS | Status: AC
  Administered 2013-08-20: 1000 mg via INTRAVENOUS
  Filled 2013-08-20: qty 10

## 2013-08-20 MED ORDER — PHENYLEPHRINE 40 MCG/ML (10ML) SYRINGE FOR IV PUSH (FOR BLOOD PRESSURE SUPPORT)
PREFILLED_SYRINGE | INTRAVENOUS | Status: AC
  Filled 2013-08-20: qty 10

## 2013-08-20 MED ORDER — ACETAMINOPHEN 650 MG RE SUPP: 650.0000 mg | Freq: Four times a day (QID) | RECTAL | Status: DC | PRN

## 2013-08-20 MED ORDER — POLYETHYLENE GLYCOL 3350 17 G PO PACK
17.0000 g | PACK | Freq: Every day | ORAL | Status: DC | PRN
  Administered 2013-08-21: 17 g via ORAL

## 2013-08-20 MED ORDER — SODIUM CHLORIDE 0.9 % IR SOLN
Status: DC | PRN
  Administered 2013-08-20: 1000 mL

## 2013-08-20 MED ORDER — HYDROCODONE-ACETAMINOPHEN 7.5-325 MG PO TABS
1.0000 | ORAL_TABLET | Freq: Four times a day (QID) | ORAL | Status: DC
  Administered 2013-08-20 – 2013-08-21 (×3): 1 via ORAL
  Filled 2013-08-20 (×2): qty 1
  Filled 2013-08-20: qty 2
  Filled 2013-08-20: qty 1

## 2013-08-20 MED ORDER — ONDANSETRON HCL 4 MG PO TABS: 4.0000 mg | ORAL_TABLET | Freq: Four times a day (QID) | ORAL | Status: DC | PRN

## 2013-08-20 MED ORDER — PROPOFOL INFUSION 10 MG/ML OPTIME
INTRAVENOUS | Status: DC | PRN
  Administered 2013-08-20: 75 ug/kg/min via INTRAVENOUS

## 2013-08-20 MED ORDER — LACTATED RINGERS IV SOLN: INTRAVENOUS | Status: DC

## 2013-08-20 MED ORDER — DEXAMETHASONE SODIUM PHOSPHATE 10 MG/ML IJ SOLN
INTRAMUSCULAR | Status: AC
  Filled 2013-08-20: qty 1

## 2013-08-20 MED ORDER — CHLORHEXIDINE GLUCONATE 4 % EX LIQD: 60.0000 mL | Freq: Once | CUTANEOUS | Status: DC

## 2013-08-20 MED ORDER — LACTATED RINGERS IV SOLN
INTRAVENOUS | Status: DC | PRN
  Administered 2013-08-20 (×2): via INTRAVENOUS

## 2013-08-20 MED ORDER — EPHEDRINE SULFATE 50 MG/ML IJ SOLN
INTRAMUSCULAR | Status: AC
  Filled 2013-08-20: qty 1

## 2013-08-20 MED ORDER — ASPIRIN EC 325 MG PO TBEC
325.0000 mg | DELAYED_RELEASE_TABLET | Freq: Every day | ORAL | Status: DC
  Administered 2013-08-21 – 2013-08-22 (×2): 325 mg via ORAL
  Filled 2013-08-20 (×3): qty 1

## 2013-08-20 MED ORDER — ALUM & MAG HYDROXIDE-SIMETH 200-200-20 MG/5ML PO SUSP
30.0000 mL | ORAL | Status: DC | PRN
Start: 2013-08-20 — End: 2013-08-22

## 2013-08-20 MED ORDER — BUPIVACAINE HCL (PF) 0.75 % IJ SOLN
INTRAMUSCULAR | Status: DC | PRN
  Administered 2013-08-20: 15 mg

## 2013-08-20 MED ORDER — DIPHENHYDRAMINE HCL 12.5 MG/5ML PO ELIX: 12.5000 mg | ORAL_SOLUTION | ORAL | Status: DC | PRN

## 2013-08-20 MED ORDER — POTASSIUM CHLORIDE 2 MEQ/ML IV SOLN
INTRAVENOUS | Status: DC
  Administered 2013-08-20 – 2013-08-21 (×2): via INTRAVENOUS
  Filled 2013-08-20 (×8): qty 1000

## 2013-08-20 MED ORDER — DEXAMETHASONE SODIUM PHOSPHATE 10 MG/ML IJ SOLN
10.0000 mg | Freq: Once | INTRAMUSCULAR | Status: DC
  Filled 2013-08-20: qty 1

## 2013-08-20 MED ORDER — DIPHENHYDRAMINE HCL 25 MG PO CAPS: 25.0000 mg | ORAL_CAPSULE | Freq: Four times a day (QID) | ORAL | Status: DC | PRN

## 2013-08-20 MED ORDER — METHOCARBAMOL 500 MG PO TABS: 500.0000 mg | ORAL_TABLET | Freq: Four times a day (QID) | ORAL | Status: DC | PRN

## 2013-08-20 MED ORDER — PHENYLEPHRINE HCL 10 MG/ML IJ SOLN
INTRAMUSCULAR | Status: DC | PRN
  Administered 2013-08-20: 60 ug via INTRAVENOUS
  Administered 2013-08-20 (×3): 40 ug via INTRAVENOUS
  Administered 2013-08-20: 80 ug via INTRAVENOUS
  Administered 2013-08-20: 60 ug via INTRAVENOUS

## 2013-08-20 SURGICAL SUPPLY — 39 items
BAG ZIPLOCK 12X15 (MISCELLANEOUS) IMPLANT
CAPT HIP PF MOP ×3 IMPLANT
COVER PERINEAL POST (MISCELLANEOUS) ×3 IMPLANT
DERMABOND ADVANCED (GAUZE/BANDAGES/DRESSINGS) ×2
DERMABOND ADVANCED .7 DNX12 (GAUZE/BANDAGES/DRESSINGS) ×1 IMPLANT
DRAPE C-ARM 42X120 X-RAY (DRAPES) ×3 IMPLANT
DRAPE STERI IOBAN 125X83 (DRAPES) ×3 IMPLANT
DRAPE U-SHAPE 47X51 STRL (DRAPES) ×9 IMPLANT
DRSG AQUACEL AG ADV 3.5X10 (GAUZE/BANDAGES/DRESSINGS) ×3 IMPLANT
DURAPREP 26ML APPLICATOR (WOUND CARE) ×3 IMPLANT
ELECT BLADE TIP CTD 4 INCH (ELECTRODE) ×3 IMPLANT
ELECT PENCIL ROCKER SW 15FT (MISCELLANEOUS) IMPLANT
ELECT REM PT RETURN 15FT ADLT (MISCELLANEOUS) IMPLANT
ELECT REM PT RETURN 9FT ADLT (ELECTROSURGICAL) ×3
ELECTRODE REM PT RTRN 9FT ADLT (ELECTROSURGICAL) ×1 IMPLANT
FACESHIELD WRAPAROUND (MASK) ×12 IMPLANT
GLOVE BIOGEL M 7.0 STRL (GLOVE) ×3 IMPLANT
GLOVE BIOGEL PI IND STRL 7.5 (GLOVE) ×2 IMPLANT
GLOVE BIOGEL PI IND STRL 8 (GLOVE) ×1 IMPLANT
GLOVE BIOGEL PI INDICATOR 7.5 (GLOVE) ×4
GLOVE BIOGEL PI INDICATOR 8 (GLOVE) ×2
GLOVE ECLIPSE 8.0 STRL XLNG CF (GLOVE) ×3 IMPLANT
GLOVE ORTHO TXT STRL SZ7.5 (GLOVE) ×6 IMPLANT
GLOVE SURG SS PI 6.5 STRL IVOR (GLOVE) ×6 IMPLANT
GOWN SPEC L3 XXLG W/TWL (GOWN DISPOSABLE) ×3 IMPLANT
GOWN STRL REUS W/TWL LRG LVL3 (GOWN DISPOSABLE) ×3 IMPLANT
HOLDER FOLEY CATH W/STRAP (MISCELLANEOUS) ×3 IMPLANT
KIT BASIN OR (CUSTOM PROCEDURE TRAY) ×3 IMPLANT
PACK TOTAL JOINT (CUSTOM PROCEDURE TRAY) ×3 IMPLANT
SAW OSC TIP CART 19.5X105X1.3 (SAW) ×3 IMPLANT
SUT MNCRL AB 4-0 PS2 18 (SUTURE) ×3 IMPLANT
SUT VIC AB 1 CT1 36 (SUTURE) ×9 IMPLANT
SUT VIC AB 2-0 CT1 27 (SUTURE) ×4
SUT VIC AB 2-0 CT1 TAPERPNT 27 (SUTURE) ×2 IMPLANT
SUT VLOC 180 0 24IN GS25 (SUTURE) ×3 IMPLANT
TOWEL OR 17X26 10 PK STRL BLUE (TOWEL DISPOSABLE) ×3 IMPLANT
TOWEL OR NON WOVEN STRL DISP B (DISPOSABLE) IMPLANT
TRAY FOLEY CATH 14FRSI W/METER (CATHETERS) ×3 IMPLANT
WATER STERILE IRR 1500ML POUR (IV SOLUTION) ×3 IMPLANT

## 2013-08-20 NOTE — Op Note (Signed)
NAME:  Robert Le                ACCOUNT NO.: [id number]      MEDICAL RECORD NO.: [id number]      FACILITY:  [facility]      PHYSICIAN:  Paralee Cancel D  DATE OF BIRTH:  04-29-1940     DATE OF PROCEDURE:  08/20/2013                                 OPERATIVE REPORT         PREOPERATIVE DIAGNOSIS: Left  hip osteoarthritis.      POSTOPERATIVE DIAGNOSIS:  Left hip osteoarthritis.      PROCEDURE:  Left total hip replacement through an anterior approach   utilizing DePuy THR system, component size 75mm pinnacle cup, a size 36+4 neutral   Altrex liner, a size 4 Standard Tri Lock stem with a 36+5 Articuleze metal head ball.      SURGEON:  Pietro Cassis. Alvan Dame, M.D.      ASSISTANT:  Nehemiah Massed, PA-C     ANESTHESIA:  Spinal.      SPECIMENS:  None.      COMPLICATIONS:  None.      BLOOD LOSS:  500 cc     DRAINS:  None.      INDICATION OF THE PROCEDURE:  Robert Le is a 73 y.o. male who had   presented to office for evaluation of left hip pain.  Radiographs revealed   progressive degenerative changes with bone-on-bone   articulation to the  hip joint.  The patient had painful limited range of   motion significantly affecting their overall quality of life.  The patient was failing to    respond to conservative measures, and at this point was ready   to proceed with more definitive measures.  The patient has noted progressive   degenerative changes in his hip, progressive problems and dysfunction   with regarding the hip prior to surgery.  Consent was obtained for   benefit of pain relief.  Specific risk of infection, DVT, component   failure, dislocation, need for revision surgery, as well discussion of   the anterior versus posterior approach were reviewed.  Consent was   obtained for benefit of anterior pain relief through an anterior   approach.      PROCEDURE IN DETAIL:  The patient was brought to operative theater.   Once adequate anesthesia,  preoperative antibiotics, 2gm of Ancef administered.   The patient was positioned supine on the OSI Hanna table.  Once adequate   padding of boney process was carried out, we had predraped out the hip, and  used fluoroscopy to confirm orientation of the pelvis and position.      The left hip was then prepped and draped from proximal iliac crest to   mid thigh with shower curtain technique.      Time-out was performed identifying the patient, planned procedure, and   extremity.     An incision was then made 2 cm distal and lateral to the   anterior superior iliac spine extending over the orientation of the   tensor fascia lata muscle and sharp dissection was carried down to the   fascia of the muscle and protractor placed in the soft tissues.      The fascia was then incised.  The muscle belly was identified and swept  laterally and retractor placed along the superior neck.  Following   cauterization of the circumflex vessels and removing some pericapsular   fat, a second cobra retractor was placed on the inferior neck.  A third   retractor was placed on the anterior acetabulum after elevating the   anterior rectus.  A L-capsulotomy was along the line of the   superior neck to the trochanteric fossa, then extended proximally and   distally.  Tag sutures were placed and the retractors were then placed   intracapsular.  We then identified the trochanteric fossa and   orientation of my neck cut, confirmed this radiographically   and then made a neck osteotomy with the femur on traction.  The femoral   head was removed without difficulty or complication.  Traction was let   off and retractors were placed posterior and anterior around the   acetabulum.      The labrum and foveal tissue were debrided.  I began reaming with a 38mm   reamer and reamed up to 55 reamer with good bony bed preparation and a 74mm   cup was chosen.  The final 66mm Pinnacle cup was then impacted under fluoroscopy   to confirm the depth of penetration and orientation with respect to   abduction.  A screw was placed followed by the hole eliminator.  The final   36+4 neutral Altrex liner was impacted with good visualized rim fit.  The cup was positioned anatomically within the acetabular portion of the pelvis.      At this point, the femur was rolled at 80 degrees.  Further capsule was   released off the inferior aspect of the femoral neck.  I then   released the superior capsule proximally.  The hook was placed laterally   along the femur and elevated manually and held in position with the bed   hook.  The leg was then extended and adducted with the leg rolled to 100   degrees of external rotation.  Once the proximal femur was fully   exposed, I used a box osteotome to set orientation.  I then began   broaching with the starting chili pepper broach and passed this by hand and then broached up to 4.  With the 4 broach in place I chose a standard offset neck and did a trial reduction first with the +1.5 then the +5 head ball.  The offset was appropriate, leg lengths   appeared to be equal, confirmed radiographically.   Given these findings, I went ahead and dislocated the hip, repositioned all   retractors and positioned the right hip in the extended and abducted position.  The final 4 Standard Tri Lock stem was   chosen and it was impacted down to the level of neck cut.  Based on this   and the trial reduction, a 36+5 Articuleze metal head ball was chosen and   impacted onto a clean and dry trunnion, and the hip was reduced.  The   hip had been irrigated throughout the case again at this point.  I did   reapproximate the superior capsular leaflet to the anterior leaflet   using #1 Vicryl.  The fascia of the   tensor fascia lata muscle was then reapproximated using #1 Vicryl and #0 V-lock sutures.  The   remaining wound was closed with 2-0 Vicryl and running 4-0 Monocryl.   The hip was cleaned, dried, and  dressed sterilely using Dermabond and   Aquacel dressing.  He was then brought   to recovery room in stable condition tolerating the procedure well.    Nehemiah Massed, PA-C was present for the entirety of the case involved from   preoperative positioning, perioperative retractor management, general   facilitation of the case, as well as primary wound closure as assistant.            Pietro Cassis Alvan Dame, M.D.        08/20/2013 10:14 AM

## 2013-08-20 NOTE — Anesthesia Postprocedure Evaluation (Signed)
  Anesthesia Post-op Note  Patient: Robert Le  Procedure(s) Performed: Procedure(s) (LRB): LEFT TOTAL HIP ARTHROPLASTY ANTERIOR APPROACH (Left)  Patient Location: PACU  Anesthesia Type: Spinal  Level of Consciousness: awake and alert   Airway and Oxygen Therapy: Patient Spontanous Breathing  Post-op Pain: mild  Post-op Assessment: Post-op Vital signs reviewed, Patient's Cardiovascular Status Stable, Respiratory Function Stable, Patent Airway and No signs of Nausea or vomiting  Last Vitals:  Filed Vitals:   08/20/13 1138  BP: 106/61  Pulse: 63  Temp:   Resp: 14    Post-op Vital Signs: stable   Complications: No apparent anesthesia complications

## 2013-08-20 NOTE — Anesthesia Preprocedure Evaluation (Addendum)
Anesthesia Evaluation  Patient identified by MRN, date of birth, ID band Patient awake    Reviewed: Allergy & Precautions, H&P , NPO status , Patient's Chart, lab work & pertinent test results  Airway Mallampati: II TM Distance: >3 FB Neck ROM: Full    Dental no notable dental hx.    Pulmonary neg pulmonary ROS,  breath sounds clear to auscultation  Pulmonary exam normal       Cardiovascular hypertension, Pt. on medications Rhythm:Regular Rate:Normal     Neuro/Psych negative neurological ROS  negative psych ROS   GI/Hepatic negative GI ROS, Neg liver ROS,   Endo/Other  negative endocrine ROS  Renal/GU L TOTAL NEPHRECTOMY  negative genitourinary   Musculoskeletal negative musculoskeletal ROS (+)   Abdominal   Peds negative pediatric ROS (+)  Hematology negative hematology ROS (+)   Anesthesia Other Findings   Reproductive/Obstetrics negative OB ROS                          Anesthesia Physical Anesthesia Plan  ASA: II  Anesthesia Plan: Spinal   Post-op Pain Management:    Induction: Intravenous  Airway Management Planned: Simple Face Mask  Additional Equipment:   Intra-op Plan:   Post-operative Plan:   Informed Consent: I have reviewed the patients History and Physical, chart, labs and discussed the procedure including the risks, benefits and alternatives for the proposed anesthesia with the patient or authorized representative who has indicated his/her understanding and acceptance.   Dental advisory given  Plan Discussed with: CRNA and Surgeon  Anesthesia Plan Comments:         Anesthesia Quick Evaluation

## 2013-08-20 NOTE — Interval H&P Note (Signed)
History and Physical Interval Note:  08/20/2013 6:52 AM  Robert Le  has presented today for surgery, with the diagnosis of left hip osteoarthritis  The various methods of treatment have been discussed with the patient and family. After consideration of risks, benefits and other options for treatment, the patient has consented to  Procedure(s): LEFT TOTAL HIP ARTHROPLASTY ANTERIOR APPROACH (Left) as a surgical intervention .  The patient's history has been reviewed, patient examined, no change in status, stable for surgery.  I have reviewed the patient's chart and labs.  Questions were answered to the patient's satisfaction.     Mauri Pole

## 2013-08-20 NOTE — Evaluation (Signed)
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 [facility] from [facility]  Clinical Impression  On eval, pt required Min assist for mobility-able to ambulate ~100 feet with rolling walker. Pt is from [place] is for ST rehab at [facility].     Follow Up Recommendations SNF    Equipment Recommendations  Rolling walker with 5" wheels    Recommendations for Other Services OT consult     Precautions / Restrictions Precautions Precautions: Fall Restrictions Weight Bearing Restrictions: No LLE Weight Bearing: Weight bearing as tolerated      Mobility  Bed Mobility Overal bed mobility: Needs Assistance Bed Mobility: Supine to Sit     Supine to sit: Min assist     General bed mobility comments: Assist for L LE off bed. VCs safety  Transfers Overall transfer level: Needs assistance Equipment used: Rolling walker (2 wheeled) Transfers: Sit to/from Stand Sit to Stand: Min assist         General transfer comment: assist to rise, stabilize, control descent. VC safety, technique, hand placement  Ambulation/Gait Ambulation/Gait assistance: Min assist Ambulation Distance (Feet): 100 Feet Assistive device: Rolling walker (2 wheeled) Gait Pattern/deviations: Antalgic;Decreased stride length     General Gait Details: assist to stabilize throughout ambulation. VCs safety, sequence. Pt transitioned to step through gait pattern as distance progressed.   Stairs            Wheelchair Mobility    Modified Rankin (Stroke Patients Only)       Balance                                             Pertinent Vitals/Pain 7/10 L hip with activity "burning". Ice applied end of session    Home Living Family/patient expects to be discharged to:: Skilled nursing facility                      Prior Function Level  of Independence: Independent               Hand Dominance        Extremity/Trunk Assessment   Upper Extremity Assessment: Overall WFL for tasks assessed           Lower Extremity Assessment: Generalized weakness;LLE deficits/detail   LLE Deficits / Details: hip flex 2/5, hip abd/add 2/5, moves ankle well  Cervical / Trunk Assessment: Normal  Communication   Communication: No difficulties  Cognition Arousal/Alertness: Awake/alert Behavior During Therapy: WFL for tasks assessed/performed Overall Cognitive Status: Within Functional Limits for tasks assessed                      General Comments      Exercises        Assessment/Plan    PT Assessment Patient needs continued PT services  PT Diagnosis Difficulty walking;Generalized weakness;Acute pain   PT Problem List Decreased strength;Decreased range of motion;Decreased activity tolerance;Decreased balance;Decreased mobility;Pain;Decreased knowledge of use of DME  PT Treatment Interventions DME instruction;Gait training;Functional mobility training;Therapeutic activities;Therapeutic exercise;Patient/family education;Balance training   PT Goals (Current goals can be found in the Care Plan section) Acute Rehab PT Goals Patient Stated Goal: to resume OP PT  PT Goal Formulation: With patient Time For Goal Achievement:  08/27/13 Potential to Achieve Goals: Good    Frequency 7X/week   Barriers to discharge        Co-evaluation               End of Session Equipment Utilized During Treatment: Gait belt Activity Tolerance: Patient tolerated treatment well Patient left: in chair;with call bell/phone within reach           Time: 1533-1550 PT Time Calculation (min): 17 min   Charges:   PT Evaluation $Initial PT Evaluation Tier I: 1 Procedure PT Treatments $Gait Training: 8-22 mins   PT G Codes:           Weston Anna, MPT Pager: [phone]

## 2013-08-20 NOTE — Progress Notes (Signed)
[id number] Rosana Hoes, RN,BSN,CCM: [phone] Chart review for needs and updates.  Bed at [facility]. No discharge needs present at time of review. Next review due [id number].

## 2013-08-20 NOTE — Transfer of Care (Signed)
Immediate Anesthesia Transfer of Care Note  Patient: Robert Le  Procedure(s) Performed: Procedure(s) (LRB): LEFT TOTAL HIP ARTHROPLASTY ANTERIOR APPROACH (Left)  Patient Location: PACU  Anesthesia Type: Spinal  Level of Consciousness: sedated, patient cooperative and responds to stimulation  Airway & Oxygen Therapy: Patient Spontanous Breathing and Patient connected to face mask oxgen  Post-op Assessment: Report given to PACU RN and Post -op Vital signs reviewed and stable  Post vital signs: Reviewed and stable S1 level on release moved lower ext, denied pain on assessment.   Complications: No apparent anesthesia complications

## 2013-08-20 NOTE — Anesthesia Procedure Notes (Signed)
Spinal  Patient location during procedure: OR Start time: 08/20/2013 8:51 AM End time: 08/20/2013 8:57 AM Staffing CRNA/Resident: Sherian Maroon A Performed by: resident/CRNA  Preanesthetic Checklist Completed: patient identified, site marked, surgical consent, pre-op evaluation, timeout performed, IV checked, risks and benefits discussed and monitors and equipment checked Spinal Block Patient position: sitting Prep: Betadine Patient monitoring: heart rate, continuous pulse ox, blood pressure and cardiac monitor Approach: midline Location: L4-5 Injection technique: single-shot Needle Needle type: Sprotte  Needle gauge: 24 G Needle length: 9 cm Assessment Sensory level: T12

## 2013-08-20 NOTE — Progress Notes (Signed)
Clinical Social Work Department CLINICAL SOCIAL WORK PLACEMENT NOTE 08/20/2013  Patient:  Robert Le, Robert Le  Account Number:  [id number] Admit date:  08/20/2013  Clinical Social Worker:  Renold Genta  Date/time:  08/20/2013 04:14 PM  Clinical Social Work is seeking post-discharge placement for this patient at the following level of care:   SKILLED NURSING   (*CSW will update this form in Epic as items are completed)   08/20/2013  Patient/family provided with [facility] Department of Clinical Social Work's list of facilities offering this level of care within the geographic area requested by the patient (or if unable, by the patient's family).  08/20/2013  Patient/family informed of their freedom to choose among providers that offer the needed level of care, that participate in Medicare, Medicaid or managed care program needed by the patient, have an available bed and are willing to accept the patient.  08/20/2013  Patient/family informed of [facility]' ownership interest in [facility], as well as of the fact that they are under no obligation to receive care at this facility.  PASARR submitted to EDS on 08/20/2013 PASARR number received on 08/20/2013  FL2 transmitted to all facilities in geographic area requested by pt/family on  08/20/2013 FL2 transmitted to all facilities within larger geographic area on   Patient informed that his/her managed care company has contracts with or will negotiate with  certain facilities, including the following:     Patient/family informed of bed offers received:   Patient chooses bed at  Physician recommends and patient chooses bed at    Patient to be transferred to  on   Patient to be transferred to facility by  Patient and family notified of transfer on  Name of family member notified:    The following physician request were entered in Epic:   Additional Comments:   Raynaldo Opitz, [place] Social Worker cell #: [phone]

## 2013-08-20 NOTE — Progress Notes (Signed)
Clinical Social Work Department BRIEF PSYCHOSOCIAL ASSESSMENT 08/20/2013  Patient:  Robert Le, Robert Le     Account Number:  [id number]     Admit date:  08/20/2013  Clinical Social Worker:  Renold Genta  Date/Time:  08/20/2013 04:10 PM  Referred by:  Physician  Date Referred:  08/20/2013 Referred for  SNF Placement   Other Referral:   Interview type:  Patient Other interview type:    PSYCHOSOCIAL DATA Living Status:  FACILITY Admitted from facility:  [facility] Level of care:  Independent Living Primary support name:  [name] (EX-wife) ph#: [phone] Primary support relationship to patient:  SPOUSE Degree of support available:   poor - recently divorced, patient is trying to think of another person to be named emergency contact    CURRENT CONCERNS Current Concerns  Post-Acute Placement   Other Concerns:    SOCIAL WORK ASSESSMENT / PLAN CSW received consult for SNF placement.   Assessment/plan status:  Information/Referral to [employer] Other assessment/ plan:   Information/referral to community resources:   CSW completed FL2 and faxed information out to [facility] - provided list of facilities and will follow-up with bed offers when available.    PATIENT'S/FAMILY'S RESPONSE TO PLAN OF CARE: Patient states that he recently moved into the independent living section of [name] about 5 months ago but has never been to SNF before. CSW spoke with patient about going to the SNF/"Healthcare" section at [facility] but patient states that he would like to "explore all options." CSW provided list and faxed information out, contacted Seth Bake (admissions coordinator @ [facility]) to make her aware. CSW will follow-up with bed offers tomorrow.       Raynaldo Opitz, [facility] Clinical Social Worker cell #: [phone]

## 2013-08-21 DIAGNOSIS — D62 Acute posthemorrhagic anemia: Secondary | ICD-10-CM | POA: Diagnosis not present

## 2013-08-21 LAB — BASIC METABOLIC PANEL
ANION GAP: 11 (ref 5–15)
BUN: 17 mg/dL (ref 6–23)
CALCIUM: 8.6 mg/dL (ref 8.4–10.5)
CO2: 23 mEq/L (ref 19–32)
Chloride: 104 mEq/L (ref 96–112)
Creatinine, Ser: 0.88 mg/dL (ref 0.50–1.35)
GFR, EST NON AFRICAN AMERICAN: 82 mL/min — AB (ref >90)
Glucose, Bld: 156 mg/dL — ABNORMAL HIGH (ref 70–99)
Potassium: 4.5 mEq/L (ref 3.7–5.3)
Sodium: 138 mEq/L (ref 137–147)

## 2013-08-21 LAB — CBC
HCT: 35.3 % — ABNORMAL LOW (ref 39.0–52.0)
Hemoglobin: 12.1 g/dL — ABNORMAL LOW (ref 13.0–17.0)
MCH: 30.9 pg (ref 26.0–34.0)
MCHC: 34.3 g/dL (ref 30.0–36.0)
MCV: 90.3 fL (ref 78.0–100.0)
PLATELETS: 168 10*3/uL (ref 150–400)
RBC: 3.91 MIL/uL — ABNORMAL LOW (ref 4.22–5.81)
RDW: 12.9 % (ref 11.5–15.5)
WBC: 13 10*3/uL — ABNORMAL HIGH (ref 4.0–10.5)

## 2013-08-21 NOTE — Progress Notes (Signed)
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 [facility] is from [facility]   OT comments  Pt with increased confusion this afternoon  Follow Up Recommendations  SNF    Equipment Recommendations  None recommended by OT       Precautions / Restrictions Precautions Precautions: Fall Restrictions Weight Bearing Restrictions: No LLE Weight Bearing: Weight bearing as tolerated       Mobility Bed Mobility Overal bed mobility: Needs Assistance Bed Mobility: Sit to Supine     Supine to sit: Min assist     General bed mobility comments: pt in chair  Transfers Overall transfer level: Needs assistance Equipment used: Rolling walker (2 wheeled) Transfers: Sit to/from Stand Sit to Stand: Min assist         General transfer comment: VCs safety, technique, hand placement        ADL Overall ADL's : Needs assistance/impaired     Grooming: Sitting;Set up   Upper Body Bathing: Set up;Sitting   Lower Body Bathing: Moderate assistance;Sit to/from stand   Upper Body Dressing : Set up;Sitting   Lower Body Dressing: Cueing for safety;Sit to/from stand;Moderate assistance   Toilet Transfer: Minimal assistance   Toileting- Clothing Manipulation and Hygiene: Minimal assistance         General ADL Comments: Pt requested OT to see pt again.  Pt shared with OT he felt he was confused.  Friend present seeing pt also stopped OT to share his concern regarding pts confusion.  OT shared with PT, RN and SW.  Pt told his friend his hip replacement was nuxt day and told CNA  he was in his [facility] at [facility].                Cognition   Behavior During Therapy: Anxious (pt admitted he was confused) Overall Cognitive Status: Pt upset regarding he now realizes he is confused.  Reinterated importance of calling for A.  Shared with RN, CNA  and Charge nurse                   Extremity/Trunk Assessment  Upper Extremity Assessment Upper Extremity Assessment: Overall WFL for tasks assessed                  General Comments  Pt with increased confusion this afternoon      Home Living Family/patient expects to be discharged to:: Skilled nursing facility                                        Prior Functioning/Environment       I living       Frequency Min 2X/week     Progress Toward Goals  OT Goals(current goals can now be found in the care plan section)  Progress towards OT goals: Not progressing toward goals - comment (due to confusion this afternooon)  Acute Rehab OT Goals Patient Stated Goal: to go back to [facility] living when ready OT Goal Formulation: With patient Time For Goal Achievement: 09/04/13 Potential to Achieve Goals: Good  Plan Discharge plan remains appropriate       End of Session     Activity Tolerance Patient tolerated treatment well   Patient Left in bed;with family/visitor present;with bed alarm set   Nurse  Communication Mobility status;Other (comment) (confusion)        Time: 1740-8144 OT Time Calculation (min): 24 min  Charges: OT General Charges $OT Visit: 1 Procedure OT Evaluation $Initial OT Evaluation Tier I: 1 Procedure OT Treatments $Self Care/Home Management : 23-37 mins  Mahogani Holohan, Thereasa Parkin 08/21/2013, 2:41 PM

## 2013-08-21 NOTE — Progress Notes (Signed)
   Subjective: 1 Day Post-Op Procedure(s) (LRB): LEFT TOTAL HIP ARTHROPLASTY ANTERIOR APPROACH (Left)   Patient reports pain as mild, pain controlled. No events throughout the night. Ready to work with PT.   Objective:   VITALS:   Filed Vitals:   08/21/13  BP: 112/62  Pulse: 79  Temp: 97.5 F (36.4 C)   Resp: 18    Dorsiflexion/Plantar flexion intact Incision: dressing C/D/I No cellulitis present Compartment soft  LABS  Recent Labs  08/21/13 0511  HGB 12.1*  HCT 35.3*  WBC 13.0*  PLT 168     Recent Labs  08/21/13 0511  NA 138  K 4.5  BUN 17  CREATININE 0.88  GLUCOSE 156*     Assessment/Plan: 1 Day Post-Op Procedure(s) (LRB): LEFT TOTAL HIP ARTHROPLASTY ANTERIOR APPROACH (Left) Foley cath d/c'ed Advance diet Up with therapy D/C IV fluids Discharge to SNF eventually, when ready  Expected ABLA  Treated with iron and will observe        West Pugh. Temari Schooler   PAC  08/21/2013, 9:32 AM

## 2013-08-21 NOTE — Progress Notes (Signed)
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 [facility] from [facility]    PT Comments    Pt reports some increased soreness this session. Slight increase in assistance needed as well. Continue to recommend SNF for ST rehab prior to returning to [facility].   Follow Up Recommendations  SNF     Equipment Recommendations  Rolling walker with 5" wheels    Recommendations for Other Services OT consult     Precautions / Restrictions Precautions Precautions: Fall Restrictions Weight Bearing Restrictions: No LLE Weight Bearing: Weight bearing as tolerated    Mobility  Bed Mobility Overal bed mobility: Needs Assistance Bed Mobility: Supine to Sit;Sit to Supine     Supine to sit: Min assist Sit to supine: Min assist   General bed mobility comments: assist for L LE  Transfers Overall transfer level: Needs assistance Equipment used: Rolling walker (2 wheeled) Transfers: Sit to/from Stand Sit to Stand: Min assist         General transfer comment: VCs safety, technique, hand placement. Assist to rise, stabilize.   Ambulation/Gait Ambulation/Gait assistance: Min guard Ambulation distance: 200 feet   Assistive device: Rolling walker (2 wheeled) Gait Pattern/deviations: Step-through pattern;Decreased stride length;Antalgic     General Gait Details: close guard for safety. VCs safety.    Stairs            Wheelchair Mobility    Modified Rankin (Stroke Patients Only)       Balance                                    Cognition Arousal/Alertness: Awake/alert Behavior During Therapy: WFL for tasks assessed/performed Overall Cognitive Status: Impaired/Different from baseline Area of Impairment: Awareness;Safety/judgement         Safety/Judgement: Decreased awareness of safety          Exercises       General Comments        Pertinent Vitals/Pain 5/10 L hip with activity. Ice applied end of session    Home Living Family/patient expects to be discharged to:: Skilled nursing facility                    Prior Function            PT Goals (current goals can now be found in the care plan section) Acute Rehab PT Goals Patient Stated Goal: to go back to [facility] when ready Progress towards PT goals: Progressing toward goals    Frequency  7X/week    PT Plan Current plan remains appropriate    Co-evaluation             End of Session   Activity Tolerance: Patient tolerated treatment well Patient left: with call bell/phone within reach;with bed alarm set;with family/visitor present     Time: 9326-7124 PT Time Calculation (min): 8 min  Charges:  $Gait Training: 8-22 mins                    G Codes:      Weston Anna, MPT Pager: [phone]

## 2013-08-21 NOTE — Progress Notes (Signed)
Discussed with Rosario Adie, PA, via phone re iv dose decadron for pt.He said to cancel med. Robert Le, [employer]

## 2013-08-21 NOTE — Progress Notes (Signed)
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 [facility]    PT Comments    Progressing well with mobility. Pt reports 4/10 pain L LE with activity. Some safety issues noted during session-pt up mobilizing unassisted in room without use of walker-unsteady-despite cueing from therapist. Safest plan would be for pt to have a short stay in rehab prior to returning home alone to allow pt to regain independence. Will continue to follow  Follow Up Recommendations  SNF (Feel pt would benefit from going to rehab section prior to returning home alone to [facility])     Equipment Recommendations  Rolling walker with 5" wheels    Recommendations for Other Services OT consult     Precautions / Restrictions Precautions Precautions: Fall Restrictions Weight Bearing Restrictions: No LLE Weight Bearing: Weight bearing as tolerated    Mobility  Bed Mobility                  Transfers Overall transfer level: Needs assistance Equipment used: Rolling walker (2 wheeled) Transfers: Sit to/from Stand Sit to Stand: Supervision         General transfer comment: VCs safety, technique, hand placement  Ambulation/Gait Ambulation/Gait assistance: Min guard Ambulation Distance (Feet): 200 Feet Assistive device: Rolling walker (2 wheeled) Gait Pattern/deviations: Decreased stride length;Step-through pattern;Antalgic     General Gait Details: close guard for safety. VCs safety. Mod cues for pt to use walker when ambulating-pt repeatedly attempts to walk without walker.    Stairs            Wheelchair Mobility    Modified Rankin (Stroke Patients Only)       Balance                                    Cognition Arousal/Alertness: Awake/alert Behavior During Therapy: WFL for tasks assessed/performed Overall  Cognitive Status: Within Functional Limits for tasks assessed                      Exercises Total Joint Exercises Hip ABduction/ADduction: AROM;Left;10 reps;Standing Long Arc Quad: AROM;Left;10 reps;Standing Knee Flexion: AROM;Left;10 reps;Standing Marching in Standing: AROM;Both;10 reps;Standing General Exercises - Lower Extremity Heel Raises: AROM;10 reps;Standing;Both    General Comments        Pertinent Vitals/Pain 4/10 L hip area. Ice applied end of session    Home Living                      Prior Function            PT Goals (current goals can now be found in the care plan section) Progress towards PT goals: Progressing toward goals    Frequency  7X/week    PT Plan Current plan remains appropriate    Co-evaluation             End of Session   Activity Tolerance: Patient tolerated treatment well Patient left: in chair;with call bell/phone within reach     Time: 1033-1056 PT Time Calculation (min): 23 min  Charges:  $Gait Training: 8-22 mins $Therapeutic Exercise: 8-22 mins                    G Codes:      Weston Anna,  MPT Pager: [phone]

## 2013-08-22 DIAGNOSIS — M6281 Muscle weakness (generalized): Secondary | ICD-10-CM | POA: Diagnosis not present

## 2013-08-22 DIAGNOSIS — I10 Essential (primary) hypertension: Secondary | ICD-10-CM | POA: Diagnosis not present

## 2013-08-22 DIAGNOSIS — M129 Arthropathy, unspecified: Secondary | ICD-10-CM | POA: Diagnosis not present

## 2013-08-22 DIAGNOSIS — R262 Difficulty in walking, not elsewhere classified: Secondary | ICD-10-CM | POA: Diagnosis not present

## 2013-08-22 DIAGNOSIS — G3184 Mild cognitive impairment, so stated: Secondary | ICD-10-CM | POA: Diagnosis not present

## 2013-08-22 DIAGNOSIS — M1610 Unilateral primary osteoarthritis, unspecified hip: Secondary | ICD-10-CM | POA: Diagnosis not present

## 2013-08-22 DIAGNOSIS — I129 Hypertensive chronic kidney disease with stage 1 through stage 4 chronic kidney disease, or unspecified chronic kidney disease: Secondary | ICD-10-CM | POA: Diagnosis not present

## 2013-08-22 DIAGNOSIS — Z96649 Presence of unspecified artificial hip joint: Secondary | ICD-10-CM | POA: Diagnosis not present

## 2013-08-22 DIAGNOSIS — Z471 Aftercare following joint replacement surgery: Secondary | ICD-10-CM | POA: Diagnosis not present

## 2013-08-22 DIAGNOSIS — F411 Generalized anxiety disorder: Secondary | ICD-10-CM | POA: Diagnosis not present

## 2013-08-22 DIAGNOSIS — N189 Chronic kidney disease, unspecified: Secondary | ICD-10-CM | POA: Diagnosis not present

## 2013-08-22 LAB — CBC
HCT: 36 % — ABNORMAL LOW (ref 39.0–52.0)
Hemoglobin: 12.1 g/dL — ABNORMAL LOW (ref 13.0–17.0)
MCH: 30.6 pg (ref 26.0–34.0)
MCHC: 33.6 g/dL (ref 30.0–36.0)
MCV: 91.1 fL (ref 78.0–100.0)
Platelets: 150 10*3/uL (ref 150–400)
RBC: 3.95 MIL/uL — ABNORMAL LOW (ref 4.22–5.81)
RDW: 13.3 % (ref 11.5–15.5)
WBC: 9.2 10*3/uL (ref 4.0–10.5)

## 2013-08-22 MED ORDER — TRAMADOL HCL 50 MG PO TABS
50.0000 mg | ORAL_TABLET | Freq: Four times a day (QID) | ORAL | Status: DC | PRN
  Administered 2013-08-22: 50 mg via ORAL
  Filled 2013-08-22: qty 1

## 2013-08-22 MED ORDER — TRAMADOL HCL 50 MG PO TABS: 50.0000 mg | ORAL_TABLET | Freq: Four times a day (QID) | ORAL | Status: DC | PRN

## 2013-08-22 MED ORDER — DSS 100 MG PO CAPS: 100.0000 mg | ORAL_CAPSULE | Freq: Two times a day (BID) | ORAL | Status: DC

## 2013-08-22 MED ORDER — TIZANIDINE HCL 4 MG PO TABS: 4.0000 mg | ORAL_TABLET | Freq: Four times a day (QID) | ORAL | Status: DC | PRN

## 2013-08-22 MED ORDER — FERROUS SULFATE 325 (65 FE) MG PO TABS: 325.0000 mg | ORAL_TABLET | Freq: Three times a day (TID) | ORAL | Status: DC

## 2013-08-22 MED ORDER — ASPIRIN 325 MG PO TBEC: 325.0000 mg | DELAYED_RELEASE_TABLET | Freq: Every day | ORAL | Status: AC

## 2013-08-22 MED ORDER — ACETAMINOPHEN 325 MG PO TABS: 650.0000 mg | ORAL_TABLET | Freq: Four times a day (QID) | ORAL | Status: DC | PRN

## 2013-08-22 MED ORDER — POLYETHYLENE GLYCOL 3350 17 G PO PACK: 17.0000 g | PACK | Freq: Every day | ORAL | Status: DC | PRN

## 2013-08-22 NOTE — Discharge Summary (Signed)
Physician Discharge Summary  Patient ID: Robert Le MRN: [id number] DOB/AGE: [birth year] 73 y.o.  Admit date: 08/20/2013 Discharge date:  08/22/2013  Procedures:  Procedure(s) (LRB): LEFT TOTAL HIP ARTHROPLASTY ANTERIOR APPROACH (Left)  Attending Physician:  Dr. Paralee Cancel   Admission Diagnoses:   Left hip OA / pain  Discharge Diagnoses:  Active Problems:   Status post total replacement of left hip   Postoperative anemia due to acute blood loss  Past Medical History  Diagnosis Date  . Hypertension   . Arthritis   . Chronic kidney disease     L TOTAL NEPHRECTOMY  . Anxiety     HPI: Robert Le, 73 y.o. male, has a history of pain and functional disability in the left hip(s) due to arthritis and patient has failed non-surgical conservative treatments for greater than 12 weeks to include NSAID's and/or analgesics, corticosteriod injections and activity modification. Onset of symptoms was gradual starting 1+ years ago with gradually worsening course since that time.The patient noted no past surgery on the left hip(s). Patient currently rates pain in the left hip at 7 out of 10 with activity. Patient has worsening of pain with activity and weight bearing, trendelenberg gait, pain that interfers with activities of daily living and pain with passive range of motion. Patient has evidence of periarticular osteophytes and joint space narrowing by imaging studies. This condition presents safety issues increasing the risk of falls. There is no current active infection. Risks, benefits and expectations were discussed with the patient. Risks including but not limited to the risk of anesthesia, blood clots, nerve damage, blood vessel damage, failure of the prosthesis, infection and up to and including death. Patient understand the risks, benefits and expectations and wishes to proceed with surgery.  PCP: No primary provider on file.   Discharged Condition: good  Hospital Course:   Patient underwent the above stated procedure on 08/20/2013. Patient tolerated the procedure well and brought to the recovery room in good condition and subsequently to the floor.  POD #1 BP: 112/62 ; Pulse: 79 ; Temp: 97.5 F (36.4 C) ; Resp: 18  Patient reports pain as mild, pain controlled. No events throughout the night. Ready to work with PT.  Dorsiflexion/plantar flexion intact, incision: dressing C/D/I, no cellulitis present and compartment soft.  LABS  Basename    HGB  12.1  HCT  35.3   POD #2  BP: 121/84 ; Pulse: 82 ; Temp: 98.2 F (36.8 C) ; Resp: 20  Patient reports pain as mild, pain controlled throughout the night on APAP. Nurse felt the Norco was effecting him mentally. Ready to be discharged to skilled nursing facility.  Dorsiflexion/plantar flexion intact, incision: dressing C/D/I, no cellulitis present and compartment soft.  LABS  Basename    HGB  12.1  HCT  36.0    Discharge Exam: General appearance: alert, cooperative and no distress Extremities: Homans sign is negative, no sign of DVT, no edema, redness or tenderness in the calves or thighs and no ulcers, gangrene or trophic changes  Disposition:     Skilled nursing facility with follow up in 2 weeks   Follow-up Information   Follow up with Mauri Pole, MD. Schedule an appointment as soon as possible for a visit in 2 weeks.   Specialty:  Orthopedic Surgery   Contact information:   [address] [place] [address] [phone]       Discharge Instructions   Call MD / Call 911  Complete by:  As directed   If you experience chest pain or shortness of breath, CALL 911 and be transported to the hospital emergency room.  If you develope a fever above 101 F, pus (white drainage) or increased drainage or redness at the wound, or calf pain, call your surgeon's office.     Change dressing    Complete by:  As directed   Maintain surgical dressing for 10-14 days, or until follow up in the  clinic.     Constipation Prevention    Complete by:  As directed   Drink plenty of fluids.  Prune juice may be helpful.  You may use a stool softener, such as Colace (over the counter) 100 mg twice a day.  Use MiraLax (over the counter) for constipation as needed.     Diet - low sodium heart healthy    Complete by:  As directed      Discharge instructions    Complete by:  As directed   Maintain surgical dressing for 10-14 days, or until follow up in the clinic. Follow up in 2 weeks at [facility]. Call with any questions or concerns.     Increase activity slowly as tolerated    Complete by:  As directed      TED hose    Complete by:  As directed   Use stockings (TED hose) for 2 weeks on both leg(s).  You may remove them at night for sleeping.     Weight bearing as tolerated    Complete by:  As directed   Laterality:  left  Extremity:  Lower             Medication List         acetaminophen 325 MG tablet  Commonly known as:  TYLENOL  Take 2 tablets (650 mg total) by mouth every 6 (six) hours as needed for mild pain (or Fever >/= 101).     amLODipine 5 MG tablet  Commonly known as:  NORVASC  Take 5 mg by mouth every morning.     aspirin 325 MG EC tablet  Take 1 tablet (325 mg total) by mouth daily with breakfast.     cholecalciferol 1000 UNITS tablet  Commonly known as:  VITAMIN D  Take 1,000 Units by mouth daily.     clonazePAM 0.5 MG tablet  Commonly known as:  KLONOPIN  Take 0.5 mg by mouth 2 (two) times daily as needed for anxiety.     DSS 100 MG Caps  Take 100 mg by mouth 2 (two) times daily.     ferrous sulfate 325 (65 FE) MG tablet  Take 1 tablet (325 mg total) by mouth 3 (three) times daily after meals.     multivitamin with minerals Tabs tablet  Take 1 tablet by mouth daily.     polyethylene glycol packet  Commonly known as:  MIRALAX / GLYCOLAX  Take 17 g by mouth daily as needed for mild constipation.     tiZANidine 4 MG tablet  Commonly  known as:  ZANAFLEX  Take 1 tablet (4 mg total) by mouth every 6 (six) hours as needed.     traMADol 50 MG tablet  Commonly known as:  ULTRAM  Take 1-2 tablets (50-100 mg total) by mouth every 6 (six) hours as needed.         Signed: West Pugh. Yeraldy Spike   PA-C  08/22/2013, 7:49 AM

## 2013-08-22 NOTE — Progress Notes (Addendum)
   Subjective: 2 Days Post-Op Procedure(s) (LRB): LEFT TOTAL HIP ARTHROPLASTY ANTERIOR APPROACH (Left)   Seen by Dr. Alvan Dame Patient reports pain as mild, pain controlled throughout the night on APAP.  Nurse felt the Norco was effecting him mentally. Ready to be discharged to skilled nursing facility.  Objective:   VITALS:   Filed Vitals:   08/22/13 0415  BP: 121/84  Pulse: 82  Temp: 98.2 F (36.8 C)  Resp: 20    Dorsiflexion/Plantar flexion intact Incision: dressing C/D/I No cellulitis present Compartment soft  LABS  Recent Labs  08/21/13 0511 08/22/13 0448  HGB 12.1* 12.1*  HCT 35.3* 36.0*  WBC 13.0* 9.2  PLT 168 150     Recent Labs  08/21/13 0511  NA 138  K 4.5  BUN 17  CREATININE 0.88  GLUCOSE 156*     Assessment/Plan: 2 Days Post-Op Procedure(s) (LRB): LEFT TOTAL HIP ARTHROPLASTY ANTERIOR APPROACH (Left) Will change to tramadol and/or APAP for pain management as needed. Up with therapy Discharge to SNF Follow up in 2 weeks at [facility]. Follow up with OLIN,Edris Friedt D in 2 weeks.  Contact information:  [facility] [address], [place] [phone]    Expected ABLA  Treated with iron and will observe       West Pugh. Resha Filippone   PAC  08/22/2013, 7:21 AM

## 2013-08-22 NOTE — Progress Notes (Signed)
Physical Therapy Treatment Patient Details Name: Robert Le MRN: [id number] DOB: [birth year] Today's Date: 08/22/2013    History of Present Illness 73 yo male s/p L THA-DA 08/20/13. [facility]    PT Comments    Progressing with mobility. Still some confusion noted at times. Pt is planning to d/c to SNF later today. Ready to d/c from PT standpoint   Follow Up Recommendations  SNF     Equipment Recommendations  Rolling walker with 5" wheels    Recommendations for Other Services OT consult     Precautions / Restrictions Precautions Precautions: Fall Restrictions Weight Bearing Restrictions: No LLE Weight Bearing: Weight bearing as tolerated    Mobility  Bed Mobility Overal bed mobility: Needs Assistance Bed Mobility: Supine to Sit     Supine to sit: Min assist     General bed mobility comments: assist for L LE  Transfers Overall transfer level: Needs assistance Equipment used: Rolling walker (2 wheeled) Transfers: Sit to/from Stand Sit to Stand: Min guard         General transfer comment: VCs safety, hand placement. close guard for safety  Ambulation/Gait Ambulation/Gait assistance: Min guard Ambulation Distance (Feet): 225 Feet Assistive device: Rolling walker (2 wheeled) Gait Pattern/deviations: Step-through pattern;Decreased stride length;Antalgic     General Gait Details: close guard for safety. VCs safety.    Stairs            Wheelchair Mobility    Modified Rankin (Stroke Patients Only)       Balance                                    Cognition Arousal/Alertness: Awake/alert Behavior During Therapy: WFL for tasks assessed/performed Overall Cognitive Status: Impaired/Different from baseline Area of Impairment: Safety/judgement;Memory     Memory: Decreased short-term memory   Safety/Judgement: Decreased awareness of safety          Exercises Total Joint Exercises Quad Sets: AROM;Both;10  reps;Seated Hip ABduction/ADduction: AROM;Left;10 reps;Standing Long Arc Quad: AROM;Left;10 reps;Seated Knee Flexion: AROM;Left;10 reps;Standing Marching in Standing: AROM;Both;10 reps;Standing General Exercises - Lower Extremity Heel Raises: AROM;10 reps;Standing;Both    General Comments        Pertinent Vitals/Pain 5/10 L hip with activity/exercises. Ice applied end of session    Home Living                      Prior Function            PT Goals (current goals can now be found in the care plan section) Progress towards PT goals: Progressing toward goals    Frequency  7X/week    PT Plan Current plan remains appropriate    Co-evaluation             End of Session Equipment Utilized During Treatment: Gait belt Activity Tolerance: Patient tolerated treatment well Patient left: in chair;with call bell/phone within reach;with chair alarm set     Time: [phone] PT Time Calculation (min): 24 min  Charges:  $Gait Training: 8-22 mins $Therapeutic Exercise: 8-22 mins                    G Codes:      Weston Anna, MPT Pager: [phone]

## 2013-08-22 NOTE — Progress Notes (Deleted)
Physician Discharge Summary  Patient ID: Robert Le MRN: [id number] DOB/AGE: 75-Dec-1942 75 y.o.  Admit date: 08/20/2013 Discharge date:  08/22/2013  Procedures:  Procedure(s) (LRB): LEFT TOTAL HIP ARTHROPLASTY ANTERIOR APPROACH (Left)  Attending Physician:  Dr. Paralee Cancel   Admission Diagnoses:   Left hip OA / pain  Discharge Diagnoses:  Active Problems:   Status post total replacement of left hip   Postoperative anemia due to acute blood loss  Past Medical History  Diagnosis Date  . Hypertension   . Arthritis   . Chronic kidney disease     L TOTAL NEPHRECTOMY  . Anxiety     HPI: Robert Le, 73 y.o. male, has a history of pain and functional disability in the left hip(s) due to arthritis and patient has failed non-surgical conservative treatments for greater than 12 weeks to include NSAID's and/or analgesics, corticosteriod injections and activity modification. Onset of symptoms was gradual starting 1+ years ago with gradually worsening course since that time.The patient noted no past surgery on the left hip(s). Patient currently rates pain in the left hip at 7 out of 10 with activity. Patient has worsening of pain with activity and weight bearing, trendelenberg gait, pain that interfers with activities of daily living and pain with passive range of motion. Patient has evidence of periarticular osteophytes and joint space narrowing by imaging studies. This condition presents safety issues increasing the risk of falls. There is no current active infection. Risks, benefits and expectations were discussed with the patient. Risks including but not limited to the risk of anesthesia, blood clots, nerve damage, blood vessel damage, failure of the prosthesis, infection and up to and including death. Patient understand the risks, benefits and expectations and wishes to proceed with surgery.   PCP: No primary provider on file.   Discharged Condition: good  Hospital Course:   Patient underwent the above stated procedure on 08/20/2013. Patient tolerated the procedure well and brought to the recovery room in good condition and subsequently to the floor.  POD #1 BP: 112/62 ; Pulse: 79 ; Temp: 97.5 F (36.4 C) ; Resp: 18  Patient reports pain as mild, pain controlled. No events throughout the night. Ready to work with PT.  Dorsiflexion/plantar flexion intact, incision: dressing C/D/I, no cellulitis present and compartment soft.   LABS  Basename    HGB  12.1  HCT  35.3   POD #2  BP: 121/84 ; Pulse: 82 ; Temp: 98.2 F (36.8 C) ; Resp: 20  Patient reports pain as mild, pain controlled throughout the night on APAP. Nurse felt the Norco was effecting him mentally. Ready to be discharged to skilled nursing facility. Dorsiflexion/plantar flexion intact, incision: dressing C/D/I, no cellulitis present and compartment soft.   LABS  Basename    HGB  12.1  HCT  36.0    Discharge Exam: General appearance: alert, cooperative and no distress Extremities: Homans sign is negative, no sign of DVT, no edema, redness or tenderness in the calves or thighs and no ulcers, gangrene or trophic changes  Disposition:       Skilled nursing facility with follow up in 2 weeks   Follow-up Information   Follow up with Mauri Pole, MD. Schedule an appointment as soon as possible for a visit in 2 weeks.   Specialty:  Orthopedic Surgery   Contact information:   [address] [place] [address] [phone]       Discharge Instructions   Call MD / Call  911    Complete by:  As directed   If you experience chest pain or shortness of breath, CALL 911 and be transported to the hospital emergency room.  If you develope a fever above 101 F, pus (white drainage) or increased drainage or redness at the wound, or calf pain, call your surgeon's office.     Change dressing    Complete by:  As directed   Maintain surgical dressing for 10-14 days, or until follow up in  the clinic.     Constipation Prevention    Complete by:  As directed   Drink plenty of fluids.  Prune juice may be helpful.  You may use a stool softener, such as Colace (over the counter) 100 mg twice a day.  Use MiraLax (over the counter) for constipation as needed.     Diet - low sodium heart healthy    Complete by:  As directed      Discharge instructions    Complete by:  As directed   Maintain surgical dressing for 10-14 days, or until follow up in the clinic. Follow up in 2 weeks at [facility]. Call with any questions or concerns.     Increase activity slowly as tolerated    Complete by:  As directed      TED hose    Complete by:  As directed   Use stockings (TED hose) for 2 weeks on both leg(s).  You may remove them at night for sleeping.     Weight bearing as tolerated    Complete by:  As directed   Laterality:  left  Extremity:  Lower             Medication List         acetaminophen 325 MG tablet  Commonly known as:  TYLENOL  Take 2 tablets (650 mg total) by mouth every 6 (six) hours as needed for mild pain (or Fever >/= 101).     amLODipine 5 MG tablet  Commonly known as:  NORVASC  Take 5 mg by mouth every morning.     aspirin 325 MG EC tablet  Take 1 tablet (325 mg total) by mouth daily with breakfast.     cholecalciferol 1000 UNITS tablet  Commonly known as:  VITAMIN D  Take 1,000 Units by mouth daily.     clonazePAM 0.5 MG tablet  Commonly known as:  KLONOPIN  Take 0.5 mg by mouth 2 (two) times daily as needed for anxiety.     DSS 100 MG Caps  Take 100 mg by mouth 2 (two) times daily.     ferrous sulfate 325 (65 FE) MG tablet  Take 1 tablet (325 mg total) by mouth 3 (three) times daily after meals.     multivitamin with minerals Tabs tablet  Take 1 tablet by mouth daily.     polyethylene glycol packet  Commonly known as:  MIRALAX / GLYCOLAX  Take 17 g by mouth daily as needed for mild constipation.     tiZANidine 4 MG tablet    Commonly known as:  ZANAFLEX  Take 1 tablet (4 mg total) by mouth every 6 (six) hours as needed.     traMADol 50 MG tablet  Commonly known as:  ULTRAM  Take 1-2 tablets (50-100 mg total) by mouth every 6 (six) hours as needed.         Signed: [name]   PA-C  08/22/2013, 7:45 AM

## 2013-08-22 NOTE — Progress Notes (Signed)
Clinical Social Work Department CLINICAL SOCIAL WORK PLACEMENT NOTE 08/22/2013  Patient:  Robert Le, Robert Le  Account Number:  [id number] Admit date:  08/20/2013  Clinical Social Worker:  Renold Genta  Date/time:  08/20/2013 04:14 PM  Clinical Social Work is seeking post-discharge placement for this patient at the following level of care:   SKILLED NURSING   (*CSW will update this form in Epic as items are completed)   08/20/2013  Patient/family provided with [place] Department of Clinical Social Work's list of facilities offering this level of care within the geographic area requested by the patient (or if unable, by the patient's family).  08/20/2013  Patient/family informed of their freedom to choose among providers that offer the needed level of care, that participate in Medicare, Medicaid or managed care program needed by the patient, have an available bed and are willing to accept the patient.  08/20/2013  Patient/family informed of [facility]' ownership interest in [facility], as well as of the fact that they are under no obligation to receive care at this facility.  PASARR submitted to EDS on 08/20/2013 PASARR number received on 08/20/2013  FL2 transmitted to all facilities in geographic area requested by pt/family on  08/20/2013 FL2 transmitted to all facilities within larger geographic area on   Patient informed that his/her managed care company has contracts with or will negotiate with  certain facilities, including the following:     Patient/family informed of bed offers received:  08/20/2013 Patient chooses bed at [facility] Physician recommends and patient chooses bed at    Patient to be transferred to [facility] on  08/22/2013 Patient to be transferred to facility by Friend Patient and family notified of transfer on 08/22/2013 Name of family member notified:  Pt declined CSW assistance.  The following physician request were  entered in Epic:   Additional Comments: Pt is in agreement with d/c to SNF today. PT agreed with car transport to SNF. NSG reviewed d/c summary, scripts, avs. Scripts are included in d/c packet.  Werner Lean LCSW [phone]

## 2013-08-22 NOTE — Progress Notes (Signed)
Held scheduled Hydrocodone as pt becomes disoriented and attempts O O B without assist. Will continue to monitor.

## 2013-08-27 DIAGNOSIS — F411 Generalized anxiety disorder: Secondary | ICD-10-CM | POA: Diagnosis not present

## 2013-08-27 DIAGNOSIS — G3184 Mild cognitive impairment, so stated: Secondary | ICD-10-CM

## 2013-08-27 DIAGNOSIS — M1610 Unilateral primary osteoarthritis, unspecified hip: Secondary | ICD-10-CM

## 2013-08-27 DIAGNOSIS — I10 Essential (primary) hypertension: Secondary | ICD-10-CM | POA: Diagnosis not present

## 2013-08-31 ENCOUNTER — Emergency Department (HOSPITAL_COMMUNITY): Payer: Medicare Other

## 2013-08-31 ENCOUNTER — Encounter (HOSPITAL_COMMUNITY): Payer: Self-pay | Admitting: Emergency Medicine

## 2013-08-31 ENCOUNTER — Emergency Department (HOSPITAL_COMMUNITY)
Admission: EM | Admit: 2013-08-31 | Discharge: 2013-08-31 | Disposition: A | Discharge status: Home / Self Care | Payer: Medicare Other | Attending: Emergency Medicine | Admitting: Emergency Medicine

## 2013-08-31 DIAGNOSIS — I129 Hypertensive chronic kidney disease with stage 1 through stage 4 chronic kidney disease, or unspecified chronic kidney disease: Secondary | ICD-10-CM | POA: Insufficient documentation

## 2013-08-31 DIAGNOSIS — F411 Generalized anxiety disorder: Secondary | ICD-10-CM | POA: Insufficient documentation

## 2013-08-31 DIAGNOSIS — Z7982 Long term (current) use of aspirin: Secondary | ICD-10-CM | POA: Diagnosis not present

## 2013-08-31 DIAGNOSIS — N189 Chronic kidney disease, unspecified: Secondary | ICD-10-CM | POA: Insufficient documentation

## 2013-08-31 DIAGNOSIS — R404 Transient alteration of awareness: Secondary | ICD-10-CM | POA: Diagnosis not present

## 2013-08-31 DIAGNOSIS — Z79899 Other long term (current) drug therapy: Secondary | ICD-10-CM | POA: Diagnosis not present

## 2013-08-31 DIAGNOSIS — I10 Essential (primary) hypertension: Secondary | ICD-10-CM | POA: Diagnosis not present

## 2013-08-31 DIAGNOSIS — M129 Arthropathy, unspecified: Secondary | ICD-10-CM | POA: Diagnosis not present

## 2013-08-31 DIAGNOSIS — R51 Headache: Secondary | ICD-10-CM | POA: Insufficient documentation

## 2013-08-31 DIAGNOSIS — F419 Anxiety disorder, unspecified: Secondary | ICD-10-CM

## 2013-08-31 DIAGNOSIS — R410 Disorientation, unspecified: Secondary | ICD-10-CM

## 2013-08-31 LAB — CBC
HCT: 39 % (ref 39.0–52.0)
Hemoglobin: 13.3 g/dL (ref 13.0–17.0)
MCH: 31.4 pg (ref 26.0–34.0)
MCHC: 34.1 g/dL (ref 30.0–36.0)
MCV: 92.2 fL (ref 78.0–100.0)
Platelets: 307 10*3/uL (ref 150–400)
RBC: 4.23 MIL/uL (ref 4.22–5.81)
RDW: 13.5 % (ref 11.5–15.5)
WBC: 8.5 10*3/uL (ref 4.0–10.5)

## 2013-08-31 LAB — COMPREHENSIVE METABOLIC PANEL
ALBUMIN: 3.4 g/dL — AB (ref 3.5–5.2)
ALT: 35 U/L (ref 0–53)
AST: 31 U/L (ref 0–37)
Alkaline Phosphatase: 76 U/L (ref 39–117)
Anion gap: 11 (ref 5–15)
BUN: 23 mg/dL (ref 6–23)
CO2: 28 mEq/L (ref 19–32)
CREATININE: 1.01 mg/dL (ref 0.50–1.35)
Calcium: 9.6 mg/dL (ref 8.4–10.5)
Chloride: 103 mEq/L (ref 96–112)
GFR calc Af Amer: 82 mL/min — ABNORMAL LOW (ref >90)
GFR calc non Af Amer: 71 mL/min — ABNORMAL LOW (ref >90)
Glucose, Bld: 122 mg/dL — ABNORMAL HIGH (ref 70–99)
POTASSIUM: 4.6 meq/L (ref 3.7–5.3)
Sodium: 142 mEq/L (ref 137–147)
Total Bilirubin: 0.5 mg/dL (ref 0.3–1.2)
Total Protein: 6.3 g/dL (ref 6.0–8.3)

## 2013-08-31 LAB — URINALYSIS, ROUTINE W REFLEX MICROSCOPIC
Glucose, UA: NEGATIVE mg/dL
Hgb urine dipstick: NEGATIVE
KETONES UR: NEGATIVE mg/dL
LEUKOCYTES UA: NEGATIVE
Nitrite: NEGATIVE
PH: 5 (ref 5.0–8.0)
Protein, ur: NEGATIVE mg/dL
Specific Gravity, Urine: 1.031 — ABNORMAL HIGH (ref 1.005–1.030)
Urobilinogen, UA: 0.2 mg/dL (ref 0.0–1.0)

## 2013-08-31 NOTE — ED Notes (Signed)
Pt presents to ed with c/o increased anxiety, following hip replacement surgery on 7/21. Pt describes what sounds like post-op delirium, periods of increased confusion, inability to express his thoughts, forgetfulness. Pt sts this is unusual for him. Sts he was on anxiety meds prior to surgery. Per family friend pt is also still getting over a divorce after 37 years of marriage. Pt sts he is very anxious because he doesn't know what is wrong with him.

## 2013-08-31 NOTE — ED Notes (Signed)
Pt ambulated to BR with walker w/o assistance to provide urine sample. Pt then requested to walk down the hall before returning to room. Pt ambulates w/o difficulty.

## 2013-08-31 NOTE — Discharge Instructions (Signed)
Return to the ED with any concerns including weakness or arm or leg, changes in vision or speech, fainting, fever/chills, difficulty breathing, decreased level of alertness/lethargy, or any other alarming symptoms

## 2013-08-31 NOTE — ED Notes (Signed)
Pt states that he has hx of anxiety and his PCP "puts me on meds then takes me off." Pt is A&O and in NAD. Pt transported to CT

## 2013-08-31 NOTE — ED Provider Notes (Signed)
CSN: [id number]     Arrival date & time 08/31/13  1528 History   First MD Initiated Contact with Patient 08/31/13 1622     Chief Complaint  Patient presents with  . Anxiety     (Consider location/radiation/quality/duration/timing/severity/associated sxs/prior Treatment) HPI Pt presents with c/o episodes of confusion which have been occurring intermittently after having hip replacement surgery on 7/21. He has hx of anxiety and states he has been off his anxiety meds for 2 weeks.  He was started back on the meds 2 days ago after seeing his PMD.  He discussed these symptoms with PMD as well.  Pt has no changes in vision or speech, no focal weakness. He describes that intermittently he feels more anxious than usual and more forgetful.  He also feels that at times it is hard for him to express himself.  No aphasia but just feels as if his thought process is slower than his usual.  He spoke to his brother's wife today and she is a Marine scientist- she advised him to go the ED for evaluation.  Pt is recovering from hip surgery well, no significant pain is ambulating well.  There are no other associated systemic symptoms, there are no other alleviating or modifying factors.   No fever/chills.    Past Medical History  Diagnosis Date  . Hypertension   . Arthritis   . Chronic kidney disease     L TOTAL NEPHRECTOMY  . Anxiety    Past Surgical History  Procedure Laterality Date  . 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 Fracture surgery      [place]  . 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 No pertinent family history. History  Substance Use Topics  . Smoking status: Never Smoker   . Smokeless tobacco: Not on file  . Alcohol Use: No    Review of Systems ROS reviewed and all otherwise negative  except for mentioned in HPI    Allergies  Sulfites  Home Medications   Prior to Admission medications   Medication Sig Start Date End Date Taking? Authorizing Provider  amLODipine (NORVASC) 5 MG tablet Take 5 mg by mouth every morning.   Yes Historical Provider, MD  aspirin EC 325 MG EC tablet Take 1 tablet (325 mg total) by mouth daily with breakfast. 08/22/13 09/18/13 Yes Lucille Passy Babish, PA-C  cholecalciferol (VITAMIN D) 1000 UNITS tablet Take 1,000 Units by mouth daily.   Yes Historical Provider, MD  clonazePAM (KLONOPIN) 0.5 MG tablet Take 0.5 mg by mouth 2 (two) times daily as needed for anxiety.   Yes Historical Provider, MD  docusate sodium 100 MG CAPS Take 100 mg by mouth 2 (two) times daily. 08/22/13  Yes Lucille Passy Babish, PA-C  ferrous sulfate 325 (65 FE) MG tablet Take 1 tablet (325 mg total) by mouth 3 (three) times daily after meals. 08/22/13  Yes Lucille Passy Babish, PA-C  Multiple Vitamin (MULTIVITAMIN WITH MINERALS) TABS tablet Take 1 tablet by mouth daily.   Yes Historical Provider, MD  polyethylene glycol (MIRALAX / GLYCOLAX) packet Take 17 g by mouth daily as needed for mild constipation. 08/22/13  Yes Matthew Scott Babish, PA-C   BP 145/93  Pulse 94  Temp(Src) 98.4 F (36.9 C) (Oral)  Resp 18  SpO2 97%  Vitals reviewed Physical Exam Physical Examination: General appearance - alert, well appearing, and in no distress Mental status - alert, oriented to person, place, and time Eyes - pupils equal and reactive, extraocular eye movements intact Mouth - mucous membranes moist, pharynx normal without lesions Chest - clear to auscultation, no wheezes, rales or rhonchi, symmetric air entry Heart - normal rate, regular rhythm, normal S1, S2, no murmurs, rubs, clicks or gallops Abdomen - soft, nontender, nondistended, no masses or organomegaly Neurological - alert, oriented x 3, cranial nerves 2-12 tested and intact, strength 5/5 in extremities x 4, sensation  intact Extremities - peripheral pulses normal, no pedal edema, no clubbing or cyanosis Skin - normal coloration and turgor, no rashes  ED Course  Procedures (including critical care time) Labs Review Labs Reviewed  COMPREHENSIVE METABOLIC PANEL - Abnormal; Notable for the following:    Glucose, Bld 122 (*)    Albumin 3.4 (*)    GFR calc non Af Amer 71 (*)    GFR calc Af Amer 82 (*)    All other components within normal limits  URINALYSIS, ROUTINE W REFLEX MICROSCOPIC - Abnormal; Notable for the following:    Color, Urine AMBER (*)    APPearance CLOUDY (*)    Specific Gravity, Urine 1.031 (*)    Bilirubin Urine SMALL (*)    All other components within normal limits  CBC    Imaging Review Ct Head Wo Contrast  08/31/2013   CLINICAL DATA:  Anxiety.  EXAM: CT HEAD WITHOUT CONTRAST  TECHNIQUE: Contiguous axial images were obtained from the base of the skull through the vertex without intravenous contrast.  COMPARISON:  None.  FINDINGS: Diffusely enlarged ventricles and subarachnoid spaces. No intracranial hemorrhage, mass lesion or CT evidence of acute infarction. Unremarkable bones and paranasal sinuses.  IMPRESSION: No acute abnormality.  Mild diffuse cerebral and cerebellar atrophy.   Electronically Signed   By: Enrique Sack M.D.   On: 08/31/2013 17:23     EKG Interpretation None      MDM   Final diagnoses:  Anxiety  Confusion with nonfocal neurological examination    Pt presenting with c/o episodes of confusion and anxiety after hip replacement 7/21.  Labs and head CT are reassuring.  Pt recently started back on his anxiety meds 2 days ago.  This may be the cause of his symptoms as he abruptly discontinued these meds prior to surgery.  Doubt acute stroke, pt has normal neuro exam.  avised him to f/u with PMD and may need MRI as an outpatient if symptoms persist.  Discharged with strict return precautions.  Pt agreeable with plan.    Threasa Beards, MD 09/01/13 [phone]

## 2013-09-02 ENCOUNTER — Telehealth: Payer: Self-pay

## 2013-09-02 DIAGNOSIS — M6281 Muscle weakness (generalized): Secondary | ICD-10-CM | POA: Diagnosis not present

## 2013-09-02 DIAGNOSIS — M25569 Pain in unspecified knee: Secondary | ICD-10-CM | POA: Diagnosis not present

## 2013-09-02 DIAGNOSIS — R269 Unspecified abnormalities of gait and mobility: Secondary | ICD-10-CM | POA: Diagnosis not present

## 2013-09-02 NOTE — Telephone Encounter (Signed)
Not urgent, will leave for Dr. Silvio Pate to review. I am not sure what is needed

## 2013-09-02 NOTE — Telephone Encounter (Signed)
Pt request MRI to be scheduled; pt is presently living at [facility]; pt said recently had lt hip replacement; pt was at health care center for 1 week for rehab; pt said he was advised when he went back to independent living to call Dr Silvio Pate to get MRI scheduled. Pt does not know why or name of person who told him to have MRI or MRI of what. Spoke with Sharee Pimple at [facility] and she advised me to contact PT at [phone]. Pt has f/u appt with ortho on 09/05/13; and f/u appt with Dr Silvio Pate 09/09/13. Spoke with Shawn at [facility] PT and knows nothing about MRI.

## 2013-09-03 NOTE — Telephone Encounter (Signed)
Please let him know that I saw the ER report and that we will discuss the MRI at his upcoming appointment. He had not even told me that he was going to be following up with me so I was a bit surprised

## 2013-09-03 NOTE — Telephone Encounter (Signed)
Spoke with patient and advised results, he states it wasn't a MRI that he needed it was a flu shot and he will wait until they are available

## 2013-09-04 ENCOUNTER — Other Ambulatory Visit: Payer: Self-pay | Admitting: Internal Medicine

## 2013-09-04 DIAGNOSIS — R269 Unspecified abnormalities of gait and mobility: Secondary | ICD-10-CM | POA: Diagnosis not present

## 2013-09-04 DIAGNOSIS — M6281 Muscle weakness (generalized): Secondary | ICD-10-CM | POA: Diagnosis not present

## 2013-09-04 DIAGNOSIS — M25569 Pain in unspecified knee: Secondary | ICD-10-CM | POA: Diagnosis not present

## 2013-09-04 NOTE — Telephone Encounter (Signed)
Never filled her before, pt has appt 09/09/13

## 2013-09-05 NOTE — Telephone Encounter (Signed)
rx called into pharmacy

## 2013-09-05 NOTE — Telephone Encounter (Signed)
I know him and brought records from [facility] to fill #[phone] x 0 pending the Monday appt Change it to bid prn

## 2013-09-06 DIAGNOSIS — M6281 Muscle weakness (generalized): Secondary | ICD-10-CM | POA: Diagnosis not present

## 2013-09-06 DIAGNOSIS — M25569 Pain in unspecified knee: Secondary | ICD-10-CM | POA: Diagnosis not present

## 2013-09-06 DIAGNOSIS — R269 Unspecified abnormalities of gait and mobility: Secondary | ICD-10-CM | POA: Diagnosis not present

## 2013-09-09 ENCOUNTER — Ambulatory Visit (INDEPENDENT_AMBULATORY_CARE_PROVIDER_SITE_OTHER): Payer: Medicare Other | Admitting: Internal Medicine

## 2013-09-09 VITALS — Ht 68.5 in | Wt 154.0 lb

## 2013-09-09 DIAGNOSIS — Z96649 Presence of unspecified artificial hip joint: Secondary | ICD-10-CM

## 2013-09-10 DIAGNOSIS — M6281 Muscle weakness (generalized): Secondary | ICD-10-CM | POA: Diagnosis not present

## 2013-09-10 DIAGNOSIS — R269 Unspecified abnormalities of gait and mobility: Secondary | ICD-10-CM | POA: Diagnosis not present

## 2013-09-10 DIAGNOSIS — M25569 Pain in unspecified knee: Secondary | ICD-10-CM | POA: Diagnosis not present

## 2013-09-10 NOTE — Assessment & Plan Note (Signed)
Doing well post-op 

## 2013-09-10 NOTE — Progress Notes (Signed)
   Subjective:    Patient ID: Robert Le, male    DOB: 08-12-1940, 73 y.o.   MRN: [id number]  HPI  Told to come here by mistake Not changing to my care  Review of Systems     Objective:   Physical Exam        Assessment & Plan:

## 2013-09-11 DIAGNOSIS — M6281 Muscle weakness (generalized): Secondary | ICD-10-CM | POA: Diagnosis not present

## 2013-09-11 DIAGNOSIS — M25569 Pain in unspecified knee: Secondary | ICD-10-CM | POA: Diagnosis not present

## 2013-09-11 DIAGNOSIS — R269 Unspecified abnormalities of gait and mobility: Secondary | ICD-10-CM | POA: Diagnosis not present

## 2013-09-13 DIAGNOSIS — R269 Unspecified abnormalities of gait and mobility: Secondary | ICD-10-CM | POA: Diagnosis not present

## 2013-09-13 DIAGNOSIS — M6281 Muscle weakness (generalized): Secondary | ICD-10-CM | POA: Diagnosis not present

## 2013-09-13 DIAGNOSIS — M25569 Pain in unspecified knee: Secondary | ICD-10-CM | POA: Diagnosis not present

## 2013-09-16 DIAGNOSIS — R413 Other amnesia: Secondary | ICD-10-CM | POA: Diagnosis not present

## 2013-09-16 DIAGNOSIS — M25569 Pain in unspecified knee: Secondary | ICD-10-CM | POA: Diagnosis not present

## 2013-09-16 DIAGNOSIS — R269 Unspecified abnormalities of gait and mobility: Secondary | ICD-10-CM | POA: Diagnosis not present

## 2013-09-16 DIAGNOSIS — M6281 Muscle weakness (generalized): Secondary | ICD-10-CM | POA: Diagnosis not present

## 2013-09-18 DIAGNOSIS — M25569 Pain in unspecified knee: Secondary | ICD-10-CM | POA: Diagnosis not present

## 2013-09-18 DIAGNOSIS — M6281 Muscle weakness (generalized): Secondary | ICD-10-CM | POA: Diagnosis not present

## 2013-09-18 DIAGNOSIS — R269 Unspecified abnormalities of gait and mobility: Secondary | ICD-10-CM | POA: Diagnosis not present

## 2013-09-20 DIAGNOSIS — R269 Unspecified abnormalities of gait and mobility: Secondary | ICD-10-CM | POA: Diagnosis not present

## 2013-09-20 DIAGNOSIS — M25569 Pain in unspecified knee: Secondary | ICD-10-CM | POA: Diagnosis not present

## 2013-09-20 DIAGNOSIS — M6281 Muscle weakness (generalized): Secondary | ICD-10-CM | POA: Diagnosis not present

## 2013-09-23 DIAGNOSIS — M6281 Muscle weakness (generalized): Secondary | ICD-10-CM | POA: Diagnosis not present

## 2013-09-23 DIAGNOSIS — M25569 Pain in unspecified knee: Secondary | ICD-10-CM | POA: Diagnosis not present

## 2013-09-23 DIAGNOSIS — R269 Unspecified abnormalities of gait and mobility: Secondary | ICD-10-CM | POA: Diagnosis not present

## 2013-09-25 DIAGNOSIS — M25569 Pain in unspecified knee: Secondary | ICD-10-CM | POA: Diagnosis not present

## 2013-09-25 DIAGNOSIS — R269 Unspecified abnormalities of gait and mobility: Secondary | ICD-10-CM | POA: Diagnosis not present

## 2013-09-25 DIAGNOSIS — M6281 Muscle weakness (generalized): Secondary | ICD-10-CM | POA: Diagnosis not present

## 2013-09-27 DIAGNOSIS — M6281 Muscle weakness (generalized): Secondary | ICD-10-CM | POA: Diagnosis not present

## 2013-09-27 DIAGNOSIS — R269 Unspecified abnormalities of gait and mobility: Secondary | ICD-10-CM | POA: Diagnosis not present

## 2013-09-27 DIAGNOSIS — M25569 Pain in unspecified knee: Secondary | ICD-10-CM | POA: Diagnosis not present

## 2013-09-30 ENCOUNTER — Telehealth: Payer: Self-pay | Admitting: Internal Medicine

## 2013-09-30 NOTE — Telephone Encounter (Signed)
Thank you Linda!

## 2013-09-30 NOTE — Telephone Encounter (Signed)
Pt called and wanted driving clearance post left hip surgery from Dr. Silvio Pate, spoke with D. Tamala Julian, Dr. Silvio Pate is not PCP.  Informed pt he needed to call his PCP Dr. Lavone Orn in [place].  Updated Epic to reflect correct PCP / lt

## 2013-10-09 DIAGNOSIS — Z96649 Presence of unspecified artificial hip joint: Secondary | ICD-10-CM | POA: Diagnosis not present

## 2013-10-09 DIAGNOSIS — M1610 Unilateral primary osteoarthritis, unspecified hip: Secondary | ICD-10-CM | POA: Diagnosis not present

## 2013-10-09 DIAGNOSIS — M25559 Pain in unspecified hip: Secondary | ICD-10-CM | POA: Diagnosis not present

## 2013-10-15 ENCOUNTER — Other Ambulatory Visit: Payer: Self-pay | Admitting: Internal Medicine

## 2013-10-15 NOTE — Telephone Encounter (Deleted)
09/05/13 

## 2013-10-15 NOTE — Telephone Encounter (Signed)
Not Dr. Alla German patient

## 2013-10-30 DIAGNOSIS — Z23 Encounter for immunization: Secondary | ICD-10-CM | POA: Diagnosis not present

## 2013-11-12 ENCOUNTER — Encounter: Payer: Self-pay | Admitting: *Deleted

## 2014-01-02 DIAGNOSIS — Z23 Encounter for immunization: Secondary | ICD-10-CM | POA: Diagnosis not present

## 2014-01-02 DIAGNOSIS — F419 Anxiety disorder, unspecified: Secondary | ICD-10-CM | POA: Diagnosis not present

## 2014-01-02 DIAGNOSIS — I10 Essential (primary) hypertension: Secondary | ICD-10-CM | POA: Diagnosis not present

## 2014-04-15 DIAGNOSIS — R413 Other amnesia: Secondary | ICD-10-CM | POA: Diagnosis not present

## 2014-04-15 DIAGNOSIS — F419 Anxiety disorder, unspecified: Secondary | ICD-10-CM | POA: Diagnosis not present

## 2014-05-13 DIAGNOSIS — Z96642 Presence of left artificial hip joint: Secondary | ICD-10-CM | POA: Diagnosis not present

## 2014-05-13 DIAGNOSIS — Z471 Aftercare following joint replacement surgery: Secondary | ICD-10-CM | POA: Diagnosis not present

## 2014-05-20 DIAGNOSIS — F419 Anxiety disorder, unspecified: Secondary | ICD-10-CM | POA: Diagnosis not present

## 2014-05-20 DIAGNOSIS — M545 Low back pain: Secondary | ICD-10-CM | POA: Diagnosis not present

## 2014-05-22 DIAGNOSIS — M25552 Pain in left hip: Secondary | ICD-10-CM | POA: Diagnosis not present

## 2014-05-22 DIAGNOSIS — R262 Difficulty in walking, not elsewhere classified: Secondary | ICD-10-CM | POA: Diagnosis not present

## 2014-05-22 DIAGNOSIS — M545 Low back pain: Secondary | ICD-10-CM | POA: Diagnosis not present

## 2014-05-22 DIAGNOSIS — M25551 Pain in right hip: Secondary | ICD-10-CM | POA: Diagnosis not present

## 2014-05-23 DIAGNOSIS — R262 Difficulty in walking, not elsewhere classified: Secondary | ICD-10-CM | POA: Diagnosis not present

## 2014-05-23 DIAGNOSIS — M25551 Pain in right hip: Secondary | ICD-10-CM | POA: Diagnosis not present

## 2014-05-23 DIAGNOSIS — M545 Low back pain: Secondary | ICD-10-CM | POA: Diagnosis not present

## 2014-05-23 DIAGNOSIS — M25552 Pain in left hip: Secondary | ICD-10-CM | POA: Diagnosis not present

## 2014-05-26 DIAGNOSIS — M25552 Pain in left hip: Secondary | ICD-10-CM | POA: Diagnosis not present

## 2014-05-26 DIAGNOSIS — M545 Low back pain: Secondary | ICD-10-CM | POA: Diagnosis not present

## 2014-05-26 DIAGNOSIS — R262 Difficulty in walking, not elsewhere classified: Secondary | ICD-10-CM | POA: Diagnosis not present

## 2014-05-26 DIAGNOSIS — M25551 Pain in right hip: Secondary | ICD-10-CM | POA: Diagnosis not present

## 2014-05-27 DIAGNOSIS — M25551 Pain in right hip: Secondary | ICD-10-CM | POA: Diagnosis not present

## 2014-05-27 DIAGNOSIS — M545 Low back pain: Secondary | ICD-10-CM | POA: Diagnosis not present

## 2014-05-27 DIAGNOSIS — M25552 Pain in left hip: Secondary | ICD-10-CM | POA: Diagnosis not present

## 2014-05-27 DIAGNOSIS — R262 Difficulty in walking, not elsewhere classified: Secondary | ICD-10-CM | POA: Diagnosis not present

## 2014-05-29 DIAGNOSIS — R262 Difficulty in walking, not elsewhere classified: Secondary | ICD-10-CM | POA: Diagnosis not present

## 2014-05-29 DIAGNOSIS — M25552 Pain in left hip: Secondary | ICD-10-CM | POA: Diagnosis not present

## 2014-05-29 DIAGNOSIS — M25551 Pain in right hip: Secondary | ICD-10-CM | POA: Diagnosis not present

## 2014-05-29 DIAGNOSIS — M545 Low back pain: Secondary | ICD-10-CM | POA: Diagnosis not present

## 2014-06-02 DIAGNOSIS — R262 Difficulty in walking, not elsewhere classified: Secondary | ICD-10-CM | POA: Diagnosis not present

## 2014-06-02 DIAGNOSIS — M25552 Pain in left hip: Secondary | ICD-10-CM | POA: Diagnosis not present

## 2014-06-02 DIAGNOSIS — M25551 Pain in right hip: Secondary | ICD-10-CM | POA: Diagnosis not present

## 2014-06-02 DIAGNOSIS — M545 Low back pain: Secondary | ICD-10-CM | POA: Diagnosis not present

## 2014-06-03 DIAGNOSIS — R262 Difficulty in walking, not elsewhere classified: Secondary | ICD-10-CM | POA: Diagnosis not present

## 2014-06-03 DIAGNOSIS — M25552 Pain in left hip: Secondary | ICD-10-CM | POA: Diagnosis not present

## 2014-06-03 DIAGNOSIS — M545 Low back pain: Secondary | ICD-10-CM | POA: Diagnosis not present

## 2014-06-03 DIAGNOSIS — M25551 Pain in right hip: Secondary | ICD-10-CM | POA: Diagnosis not present

## 2014-06-05 DIAGNOSIS — R262 Difficulty in walking, not elsewhere classified: Secondary | ICD-10-CM | POA: Diagnosis not present

## 2014-06-05 DIAGNOSIS — M545 Low back pain: Secondary | ICD-10-CM | POA: Diagnosis not present

## 2014-06-05 DIAGNOSIS — M25551 Pain in right hip: Secondary | ICD-10-CM | POA: Diagnosis not present

## 2014-06-05 DIAGNOSIS — M25552 Pain in left hip: Secondary | ICD-10-CM | POA: Diagnosis not present

## 2014-06-09 DIAGNOSIS — M25551 Pain in right hip: Secondary | ICD-10-CM | POA: Diagnosis not present

## 2014-06-09 DIAGNOSIS — R262 Difficulty in walking, not elsewhere classified: Secondary | ICD-10-CM | POA: Diagnosis not present

## 2014-06-09 DIAGNOSIS — M25552 Pain in left hip: Secondary | ICD-10-CM | POA: Diagnosis not present

## 2014-06-09 DIAGNOSIS — M545 Low back pain: Secondary | ICD-10-CM | POA: Diagnosis not present

## 2014-06-10 DIAGNOSIS — M25551 Pain in right hip: Secondary | ICD-10-CM | POA: Diagnosis not present

## 2014-06-10 DIAGNOSIS — M25552 Pain in left hip: Secondary | ICD-10-CM | POA: Diagnosis not present

## 2014-06-10 DIAGNOSIS — R262 Difficulty in walking, not elsewhere classified: Secondary | ICD-10-CM | POA: Diagnosis not present

## 2014-06-10 DIAGNOSIS — M545 Low back pain: Secondary | ICD-10-CM | POA: Diagnosis not present

## 2014-06-12 DIAGNOSIS — M25552 Pain in left hip: Secondary | ICD-10-CM | POA: Diagnosis not present

## 2014-06-12 DIAGNOSIS — M25551 Pain in right hip: Secondary | ICD-10-CM | POA: Diagnosis not present

## 2014-06-12 DIAGNOSIS — R262 Difficulty in walking, not elsewhere classified: Secondary | ICD-10-CM | POA: Diagnosis not present

## 2014-06-12 DIAGNOSIS — M545 Low back pain: Secondary | ICD-10-CM | POA: Diagnosis not present

## 2014-06-13 DIAGNOSIS — R413 Other amnesia: Secondary | ICD-10-CM | POA: Diagnosis not present

## 2014-06-13 DIAGNOSIS — I10 Essential (primary) hypertension: Secondary | ICD-10-CM | POA: Diagnosis not present

## 2014-06-13 DIAGNOSIS — F419 Anxiety disorder, unspecified: Secondary | ICD-10-CM | POA: Diagnosis not present

## 2014-06-16 DIAGNOSIS — M25552 Pain in left hip: Secondary | ICD-10-CM | POA: Diagnosis not present

## 2014-06-16 DIAGNOSIS — M545 Low back pain: Secondary | ICD-10-CM | POA: Diagnosis not present

## 2014-06-16 DIAGNOSIS — R262 Difficulty in walking, not elsewhere classified: Secondary | ICD-10-CM | POA: Diagnosis not present

## 2014-06-16 DIAGNOSIS — M25551 Pain in right hip: Secondary | ICD-10-CM | POA: Diagnosis not present

## 2014-06-17 DIAGNOSIS — M545 Low back pain: Secondary | ICD-10-CM | POA: Diagnosis not present

## 2014-06-17 DIAGNOSIS — M25551 Pain in right hip: Secondary | ICD-10-CM | POA: Diagnosis not present

## 2014-06-17 DIAGNOSIS — M25552 Pain in left hip: Secondary | ICD-10-CM | POA: Diagnosis not present

## 2014-06-17 DIAGNOSIS — R262 Difficulty in walking, not elsewhere classified: Secondary | ICD-10-CM | POA: Diagnosis not present

## 2014-06-19 DIAGNOSIS — M545 Low back pain: Secondary | ICD-10-CM | POA: Diagnosis not present

## 2014-06-19 DIAGNOSIS — R262 Difficulty in walking, not elsewhere classified: Secondary | ICD-10-CM | POA: Diagnosis not present

## 2014-06-19 DIAGNOSIS — M25551 Pain in right hip: Secondary | ICD-10-CM | POA: Diagnosis not present

## 2014-06-19 DIAGNOSIS — M25552 Pain in left hip: Secondary | ICD-10-CM | POA: Diagnosis not present

## 2014-07-07 DIAGNOSIS — R262 Difficulty in walking, not elsewhere classified: Secondary | ICD-10-CM | POA: Diagnosis not present

## 2014-07-07 DIAGNOSIS — M25551 Pain in right hip: Secondary | ICD-10-CM | POA: Diagnosis not present

## 2014-07-07 DIAGNOSIS — M25552 Pain in left hip: Secondary | ICD-10-CM | POA: Diagnosis not present

## 2014-07-07 DIAGNOSIS — M545 Low back pain: Secondary | ICD-10-CM | POA: Diagnosis not present

## 2014-07-09 DIAGNOSIS — M25551 Pain in right hip: Secondary | ICD-10-CM | POA: Diagnosis not present

## 2014-07-09 DIAGNOSIS — R262 Difficulty in walking, not elsewhere classified: Secondary | ICD-10-CM | POA: Diagnosis not present

## 2014-07-09 DIAGNOSIS — M545 Low back pain: Secondary | ICD-10-CM | POA: Diagnosis not present

## 2014-07-09 DIAGNOSIS — M25552 Pain in left hip: Secondary | ICD-10-CM | POA: Diagnosis not present

## 2014-09-24 DIAGNOSIS — Z23 Encounter for immunization: Secondary | ICD-10-CM | POA: Diagnosis not present

## 2014-11-17 DIAGNOSIS — M25551 Pain in right hip: Secondary | ICD-10-CM | POA: Diagnosis not present

## 2014-11-17 DIAGNOSIS — R262 Difficulty in walking, not elsewhere classified: Secondary | ICD-10-CM | POA: Diagnosis not present

## 2014-11-17 DIAGNOSIS — M6281 Muscle weakness (generalized): Secondary | ICD-10-CM | POA: Diagnosis not present

## 2014-11-18 DIAGNOSIS — R262 Difficulty in walking, not elsewhere classified: Secondary | ICD-10-CM | POA: Diagnosis not present

## 2014-11-18 DIAGNOSIS — M6281 Muscle weakness (generalized): Secondary | ICD-10-CM | POA: Diagnosis not present

## 2014-11-18 DIAGNOSIS — M25551 Pain in right hip: Secondary | ICD-10-CM | POA: Diagnosis not present

## 2014-11-21 DIAGNOSIS — R262 Difficulty in walking, not elsewhere classified: Secondary | ICD-10-CM | POA: Diagnosis not present

## 2014-11-21 DIAGNOSIS — M25551 Pain in right hip: Secondary | ICD-10-CM | POA: Diagnosis not present

## 2014-11-21 DIAGNOSIS — M6281 Muscle weakness (generalized): Secondary | ICD-10-CM | POA: Diagnosis not present

## 2014-11-24 DIAGNOSIS — R262 Difficulty in walking, not elsewhere classified: Secondary | ICD-10-CM | POA: Diagnosis not present

## 2014-11-24 DIAGNOSIS — M6281 Muscle weakness (generalized): Secondary | ICD-10-CM | POA: Diagnosis not present

## 2014-11-24 DIAGNOSIS — M25551 Pain in right hip: Secondary | ICD-10-CM | POA: Diagnosis not present

## 2014-11-25 DIAGNOSIS — M25551 Pain in right hip: Secondary | ICD-10-CM | POA: Diagnosis not present

## 2014-11-25 DIAGNOSIS — M6281 Muscle weakness (generalized): Secondary | ICD-10-CM | POA: Diagnosis not present

## 2014-11-25 DIAGNOSIS — R262 Difficulty in walking, not elsewhere classified: Secondary | ICD-10-CM | POA: Diagnosis not present

## 2014-11-28 DIAGNOSIS — M25551 Pain in right hip: Secondary | ICD-10-CM | POA: Diagnosis not present

## 2014-11-28 DIAGNOSIS — M6281 Muscle weakness (generalized): Secondary | ICD-10-CM | POA: Diagnosis not present

## 2014-11-28 DIAGNOSIS — R262 Difficulty in walking, not elsewhere classified: Secondary | ICD-10-CM | POA: Diagnosis not present

## 2014-12-01 DIAGNOSIS — M25551 Pain in right hip: Secondary | ICD-10-CM | POA: Diagnosis not present

## 2014-12-01 DIAGNOSIS — R262 Difficulty in walking, not elsewhere classified: Secondary | ICD-10-CM | POA: Diagnosis not present

## 2014-12-01 DIAGNOSIS — M6281 Muscle weakness (generalized): Secondary | ICD-10-CM | POA: Diagnosis not present

## 2014-12-02 DIAGNOSIS — M25551 Pain in right hip: Secondary | ICD-10-CM | POA: Diagnosis not present

## 2014-12-02 DIAGNOSIS — M6281 Muscle weakness (generalized): Secondary | ICD-10-CM | POA: Diagnosis not present

## 2014-12-02 DIAGNOSIS — R262 Difficulty in walking, not elsewhere classified: Secondary | ICD-10-CM | POA: Diagnosis not present

## 2014-12-04 DIAGNOSIS — M25551 Pain in right hip: Secondary | ICD-10-CM | POA: Diagnosis not present

## 2014-12-04 DIAGNOSIS — R262 Difficulty in walking, not elsewhere classified: Secondary | ICD-10-CM | POA: Diagnosis not present

## 2014-12-04 DIAGNOSIS — M6281 Muscle weakness (generalized): Secondary | ICD-10-CM | POA: Diagnosis not present

## 2014-12-08 DIAGNOSIS — M25551 Pain in right hip: Secondary | ICD-10-CM | POA: Diagnosis not present

## 2014-12-08 DIAGNOSIS — R262 Difficulty in walking, not elsewhere classified: Secondary | ICD-10-CM | POA: Diagnosis not present

## 2014-12-08 DIAGNOSIS — M6281 Muscle weakness (generalized): Secondary | ICD-10-CM | POA: Diagnosis not present

## 2014-12-11 DIAGNOSIS — M25551 Pain in right hip: Secondary | ICD-10-CM | POA: Diagnosis not present

## 2014-12-11 DIAGNOSIS — M6281 Muscle weakness (generalized): Secondary | ICD-10-CM | POA: Diagnosis not present

## 2014-12-11 DIAGNOSIS — R262 Difficulty in walking, not elsewhere classified: Secondary | ICD-10-CM | POA: Diagnosis not present

## 2014-12-12 DIAGNOSIS — M25551 Pain in right hip: Secondary | ICD-10-CM | POA: Diagnosis not present

## 2014-12-12 DIAGNOSIS — R262 Difficulty in walking, not elsewhere classified: Secondary | ICD-10-CM | POA: Diagnosis not present

## 2014-12-12 DIAGNOSIS — M6281 Muscle weakness (generalized): Secondary | ICD-10-CM | POA: Diagnosis not present

## 2014-12-15 DIAGNOSIS — M25551 Pain in right hip: Secondary | ICD-10-CM | POA: Diagnosis not present

## 2014-12-15 DIAGNOSIS — M6281 Muscle weakness (generalized): Secondary | ICD-10-CM | POA: Diagnosis not present

## 2014-12-15 DIAGNOSIS — R262 Difficulty in walking, not elsewhere classified: Secondary | ICD-10-CM | POA: Diagnosis not present

## 2014-12-16 DIAGNOSIS — R413 Other amnesia: Secondary | ICD-10-CM | POA: Diagnosis not present

## 2014-12-16 DIAGNOSIS — I10 Essential (primary) hypertension: Secondary | ICD-10-CM | POA: Diagnosis not present

## 2014-12-16 DIAGNOSIS — Z1389 Encounter for screening for other disorder: Secondary | ICD-10-CM | POA: Diagnosis not present

## 2014-12-17 DIAGNOSIS — M6281 Muscle weakness (generalized): Secondary | ICD-10-CM | POA: Diagnosis not present

## 2014-12-17 DIAGNOSIS — R262 Difficulty in walking, not elsewhere classified: Secondary | ICD-10-CM | POA: Diagnosis not present

## 2014-12-17 DIAGNOSIS — M25551 Pain in right hip: Secondary | ICD-10-CM | POA: Diagnosis not present

## 2014-12-19 DIAGNOSIS — R262 Difficulty in walking, not elsewhere classified: Secondary | ICD-10-CM | POA: Diagnosis not present

## 2014-12-19 DIAGNOSIS — M25551 Pain in right hip: Secondary | ICD-10-CM | POA: Diagnosis not present

## 2014-12-19 DIAGNOSIS — M6281 Muscle weakness (generalized): Secondary | ICD-10-CM | POA: Diagnosis not present

## 2015-01-08 DIAGNOSIS — M6281 Muscle weakness (generalized): Secondary | ICD-10-CM | POA: Diagnosis not present

## 2015-01-08 DIAGNOSIS — M25551 Pain in right hip: Secondary | ICD-10-CM | POA: Diagnosis not present

## 2015-01-08 DIAGNOSIS — R262 Difficulty in walking, not elsewhere classified: Secondary | ICD-10-CM | POA: Diagnosis not present

## 2015-02-18 DIAGNOSIS — M5442 Lumbago with sciatica, left side: Secondary | ICD-10-CM | POA: Diagnosis not present

## 2015-02-18 DIAGNOSIS — M5441 Lumbago with sciatica, right side: Secondary | ICD-10-CM | POA: Diagnosis not present

## 2015-03-18 DIAGNOSIS — F0390 Unspecified dementia without behavioral disturbance: Secondary | ICD-10-CM | POA: Diagnosis not present

## 2015-04-21 ENCOUNTER — Ambulatory Visit (INDEPENDENT_AMBULATORY_CARE_PROVIDER_SITE_OTHER): Payer: Medicare Other | Admitting: Internal Medicine

## 2015-04-21 ENCOUNTER — Encounter: Payer: Self-pay | Admitting: Internal Medicine

## 2015-04-21 VITALS — BP 116/84 | HR 73 | Temp 98.2°F | Ht 68.0 in | Wt 149.8 lb

## 2015-04-21 DIAGNOSIS — I10 Essential (primary) hypertension: Secondary | ICD-10-CM | POA: Diagnosis not present

## 2015-04-21 DIAGNOSIS — Z7189 Other specified counseling: Secondary | ICD-10-CM | POA: Diagnosis not present

## 2015-04-21 DIAGNOSIS — M1611 Unilateral primary osteoarthritis, right hip: Secondary | ICD-10-CM | POA: Diagnosis not present

## 2015-04-21 DIAGNOSIS — Z8679 Personal history of other diseases of the circulatory system: Secondary | ICD-10-CM | POA: Insufficient documentation

## 2015-04-21 DIAGNOSIS — F39 Unspecified mood [affective] disorder: Secondary | ICD-10-CM

## 2015-04-21 DIAGNOSIS — G4700 Insomnia, unspecified: Secondary | ICD-10-CM | POA: Insufficient documentation

## 2015-04-21 NOTE — Progress Notes (Signed)
Subjective:    Patient ID: Robert Le, male    DOB: 1940/06/30, 75 y.o.   MRN: [id number]  HPI Here to establish  I had seen him at [facility] after left THR Only there a few days Had been seeing Dr Laurann Montana in [place]  HTN goes back some time No chest pain No SOB Keeps up with regular exercise---usually 4 days per week. Some are formal classes  Has had anxiety in the past Stress with fairly recent divorce---but doing okay No depression or anhedonia  Current Outpatient Prescriptions on File Prior to Visit  Medication Sig Dispense Refill  . amLODipine (NORVASC) 5 MG tablet Take 5 mg by mouth every morning.    . cholecalciferol (VITAMIN D) 1000 UNITS tablet Take 1,000 Units by mouth daily.    . Multiple Vitamin (MULTIVITAMIN WITH MINERALS) TABS tablet Take 1 tablet by mouth daily.     No current facility-administered medications on file prior to visit.    Allergies  Allergen Reactions  . Sulfites     Unable to recall reaction or specific sulfa drug    Past Medical History  Diagnosis Date  . Hypertension   . Arthritis   . Chronic kidney disease     L TOTAL NEPHRECTOMY  . Anxiety     Past Surgical History  Procedure Laterality Date  . 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 Fracture surgery      [place]  . 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 Heart disease Father   . Arthritis Brother   . Cancer Neg Hx   . Diabetes Neg Hx   . Arthritis Brother     Social History   Social History  . Marital Status: Divorced    Spouse Name: N/A  . Number of Children: 3  . Years of Education: N/A   Occupational History  . 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 Smoking status: Never Smoker   .  Smokeless tobacco: Never Used  . Alcohol Use: No  . Drug Use: No  . Sexual Activity: Not Currently   Other Topics Concern  . Not on file   Social History Narrative   Not sure about advanced directives (may have lost papers in divorce 2015)   Would want brother [name] to make decisions for now   Would accept resuscitation attempts   Not sure about tube feeds      Review of Systems  Constitutional: Negative for fatigue and unexpected weight change.  HENT: Negative for dental problem and hearing loss.   Eyes: Negative for visual disturbance.  Respiratory: Negative for cough, chest tightness and shortness of breath.   Cardiovascular: Negative for chest pain, palpitations and leg swelling.  Gastrointestinal: Negative for abdominal pain, diarrhea and constipation.  Genitourinary: Negative for urgency and difficulty urinating.  Musculoskeletal: Positive for arthralgias. Negative for back pain.       Now with right hip pain---planning THR on right by Dr Alvan Dame  Skin: Negative for rash.  Neurological: Negative for dizziness, tremors, syncope, weakness, light-headedness and headaches.  Psychiatric/Behavioral: Negative for sleep disturbance and dysphoric mood. The patient is not nervous/anxious.  Did have brief spell awakening confused (like in a dream)       Objective:   Physical Exam  Constitutional: He appears well-developed and well-nourished. No distress.  HENT:  Mouth/Throat: Oropharynx is clear and moist. No oropharyngeal exudate.  Neck: Normal range of motion. Neck supple. No thyromegaly present.  Cardiovascular: Normal rate, regular rhythm, normal heart sounds and intact distal pulses.  Exam reveals no gallop.   No murmur heard. Pulmonary/Chest: Effort normal and breath sounds normal. No respiratory distress. He has no wheezes. He has no rales.  Abdominal: Soft. There is no tenderness.  Musculoskeletal: He exhibits no edema or tenderness.  Lymphadenopathy:    He has no  cervical adenopathy.  Skin: No rash noted.  Psychiatric: He has a normal mood and affect. His behavior is normal.          Assessment & Plan:

## 2015-04-21 NOTE — Assessment & Plan Note (Signed)
Planning for right THR by Dr Alvan Dame soon No meds for this

## 2015-04-21 NOTE — Assessment & Plan Note (Signed)
See social history Blank forms given 

## 2015-04-21 NOTE — Assessment & Plan Note (Signed)
Chronic mild anxiety but doing well now without meds

## 2015-04-21 NOTE — Progress Notes (Signed)
Pre visit review using our clinic review tool, if applicable. No additional management support is needed unless otherwise documented below in the visit note. 

## 2015-04-21 NOTE — Assessment & Plan Note (Signed)
BP Readings from Last 3 Encounters:  04/21/15 116/84  08/31/13 145/93  08/22/13 127/75   Doing fine on the amlodipine

## 2015-05-27 DIAGNOSIS — S76011A Strain of muscle, fascia and tendon of right hip, initial encounter: Secondary | ICD-10-CM | POA: Diagnosis not present

## 2015-06-05 ENCOUNTER — Telehealth: Payer: Self-pay | Admitting: Internal Medicine

## 2015-06-05 NOTE — Telephone Encounter (Signed)
Merry Proud called wanting to talk to  dr Silvio Pate Regarding [facility] overall health

## 2015-06-05 NOTE — Telephone Encounter (Signed)
Called him  Son now has health care POA He is concerned about cognitive decline---in afternoon he is having trouble Worried about possible early Alzheimers  Needs DPR Recommended that he come in for his next visit here--then we can review these concerns

## 2015-06-26 DIAGNOSIS — Z1211 Encounter for screening for malignant neoplasm of colon: Secondary | ICD-10-CM | POA: Diagnosis not present

## 2015-06-26 DIAGNOSIS — Z1389 Encounter for screening for other disorder: Secondary | ICD-10-CM | POA: Diagnosis not present

## 2015-06-26 DIAGNOSIS — Z0000 Encounter for general adult medical examination without abnormal findings: Secondary | ICD-10-CM | POA: Diagnosis not present

## 2015-06-26 DIAGNOSIS — R413 Other amnesia: Secondary | ICD-10-CM | POA: Diagnosis not present

## 2015-06-26 DIAGNOSIS — I10 Essential (primary) hypertension: Secondary | ICD-10-CM | POA: Diagnosis not present

## 2015-07-06 ENCOUNTER — Ambulatory Visit (INDEPENDENT_AMBULATORY_CARE_PROVIDER_SITE_OTHER): Payer: Medicare Other | Admitting: Internal Medicine

## 2015-07-06 ENCOUNTER — Encounter: Payer: Self-pay | Admitting: Internal Medicine

## 2015-07-06 VITALS — BP 112/88 | HR 73 | Temp 97.4°F | Wt 153.0 lb

## 2015-07-06 DIAGNOSIS — M24541 Contracture, right hand: Secondary | ICD-10-CM | POA: Diagnosis not present

## 2015-07-06 NOTE — Assessment & Plan Note (Signed)
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

## 2015-07-06 NOTE — Progress Notes (Signed)
   Subjective:    Patient ID: Robert Le, male    DOB: 1940/10/10, 75 y.o.   MRN: [id number]  HPI Here due to right hand pain  He thinks he may have "dislocated hand" About 4 weeks ago he onticed 3rd finger is "jammed in there" No recent injury or trauma (remembers an old baseball injury) Has had to massage that finger in the past and it would improve--not working this time Not much pain--unless directly hit Still can make a fist--just has trouble opening completely  Current Outpatient Prescriptions on File Prior to Visit  Medication Sig Dispense Refill  . amLODipine (NORVASC) 5 MG tablet Take 5 mg by mouth every morning.    . cholecalciferol (VITAMIN D) 1000 UNITS tablet Take 1,000 Units by mouth daily.    . Multiple Vitamin (MULTIVITAMIN WITH MINERALS) TABS tablet Take 1 tablet by mouth daily.     No current facility-administered medications on file prior to visit.    Allergies  Allergen Reactions  . Sulfites     Unable to recall reaction or specific sulfa drug    Past Medical History  Diagnosis Date  . Hypertension   . Arthritis   . Chronic kidney disease     L TOTAL NEPHRECTOMY  . Anxiety     Past Surgical History  Procedure Laterality Date  . 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 Fracture surgery      [place]  . 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 Heart disease Father   . Arthritis Brother   . Cancer Neg Hx   . Diabetes Neg Hx   . Arthritis Brother     Social History   Social History  . Marital Status: Divorced    Spouse Name: N/A  . Number of Children: 3  . Years of Education: N/A   Occupational History  . 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 Smoking status: Never Smoker    . Smokeless tobacco: Never Used  . Alcohol Use: No  . Drug Use: No  . Sexual Activity: Not Currently   Other Topics Concern  . Not on file   Social History Narrative   Not sure about advanced directives (may have lost papers in divorce 2015)   Would want brother [name] to make decisions for now   Would accept resuscitation attempts   Not sure about tube feeds      Review of Systems Was due for right THR---but it seems that the pain isn't that bad Just has lift in other shoe and holding off on surgery    Objective:   Physical Exam  Musculoskeletal:  No joint swelling in right hand All right fingers appear to be slight flexed at MCP Passive extension is normal in all fingers except 3rd finger Pain and restriction with passive extension of 3rd finger          Assessment & Plan:

## 2015-07-06 NOTE — Progress Notes (Signed)
Pre visit review using our clinic review tool, if applicable. No additional management support is needed unless otherwise documented below in the visit note. 

## 2015-07-07 ENCOUNTER — Inpatient Hospital Stay: Admit: 2015-07-07 | Payer: Self-pay | Admitting: Orthopedic Surgery

## 2015-07-07 SURGERY — ARTHROPLASTY, HIP, TOTAL, ANTERIOR APPROACH
Anesthesia: Spinal | Site: Hip | Laterality: Right

## 2015-07-08 ENCOUNTER — Ambulatory Visit: Payer: PRIVATE HEALTH INSURANCE | Admitting: Family Medicine

## 2015-07-17 DIAGNOSIS — M19041 Primary osteoarthritis, right hand: Secondary | ICD-10-CM | POA: Insufficient documentation

## 2015-07-17 DIAGNOSIS — M79641 Pain in right hand: Secondary | ICD-10-CM | POA: Diagnosis not present

## 2015-07-17 DIAGNOSIS — M1811 Unilateral primary osteoarthritis of first carpometacarpal joint, right hand: Secondary | ICD-10-CM | POA: Diagnosis not present

## 2015-07-24 DIAGNOSIS — M19041 Primary osteoarthritis, right hand: Secondary | ICD-10-CM | POA: Diagnosis not present

## 2015-07-24 DIAGNOSIS — M1811 Unilateral primary osteoarthritis of first carpometacarpal joint, right hand: Secondary | ICD-10-CM | POA: Diagnosis not present

## 2015-08-14 DIAGNOSIS — Z1211 Encounter for screening for malignant neoplasm of colon: Secondary | ICD-10-CM | POA: Diagnosis not present

## 2015-08-14 DIAGNOSIS — Z86018 Personal history of other benign neoplasm: Secondary | ICD-10-CM | POA: Diagnosis not present

## 2015-09-28 DIAGNOSIS — Z23 Encounter for immunization: Secondary | ICD-10-CM | POA: Diagnosis not present

## 2015-10-29 DIAGNOSIS — M545 Low back pain: Secondary | ICD-10-CM | POA: Diagnosis not present

## 2015-11-09 DIAGNOSIS — M545 Low back pain: Secondary | ICD-10-CM | POA: Diagnosis not present

## 2015-11-17 DIAGNOSIS — M545 Low back pain: Secondary | ICD-10-CM | POA: Diagnosis not present

## 2015-12-09 ENCOUNTER — Telehealth: Payer: Self-pay

## 2015-12-09 NOTE — Telephone Encounter (Signed)
Merry Proud pts son (DPR signed) left v/m requesting Dr Silvio Pate cb prior to pt being seen on 12/10/15 at 2 PM. I tried to contact [place] for more info but was unable to reach by phone.

## 2015-12-09 NOTE — Telephone Encounter (Signed)
He and brother have some concerns about his memory They are coming tomorrow with him and he is aware of why they are bringing him in [place] conversations, etc Dr Laurann Montana a year ago felt he needed to have financial support and consider stopping driving

## 2015-12-10 ENCOUNTER — Ambulatory Visit (INDEPENDENT_AMBULATORY_CARE_PROVIDER_SITE_OTHER): Payer: Medicare Other | Admitting: Internal Medicine

## 2015-12-10 ENCOUNTER — Encounter: Payer: Self-pay | Admitting: Internal Medicine

## 2015-12-10 DIAGNOSIS — R413 Other amnesia: Secondary | ICD-10-CM | POA: Diagnosis not present

## 2015-12-10 LAB — CBC WITH DIFFERENTIAL/PLATELET
BASOS ABS: 0 10*3/uL (ref 0.0–0.1)
Basophils Relative: 0.4 % (ref 0.0–3.0)
EOS PCT: 2.2 % (ref 0.0–5.0)
Eosinophils Absolute: 0.1 10*3/uL (ref 0.0–0.7)
HCT: 46.8 % (ref 39.0–52.0)
Hemoglobin: 15.8 g/dL (ref 13.0–17.0)
Lymphocytes Relative: 22.3 % (ref 12.0–46.0)
Lymphs Abs: 1.4 10*3/uL (ref 0.7–4.0)
MCHC: 33.9 g/dL (ref 30.0–36.0)
MCV: 91.8 fl (ref 78.0–100.0)
MONOS PCT: 9.8 % (ref 3.0–12.0)
Monocytes Absolute: 0.6 10*3/uL (ref 0.1–1.0)
NEUTROS ABS: 4.2 10*3/uL (ref 1.4–7.7)
Neutrophils Relative %: 65.3 % (ref 43.0–77.0)
Platelets: 226 10*3/uL (ref 150.0–400.0)
RBC: 5.1 Mil/uL (ref 4.22–5.81)
RDW: 14 % (ref 11.5–15.5)
WBC: 6.4 10*3/uL (ref 4.0–10.5)

## 2015-12-10 LAB — COMPREHENSIVE METABOLIC PANEL
ALT: 19 U/L (ref 0–53)
AST: 22 U/L (ref 0–37)
Albumin: 4.1 g/dL (ref 3.5–5.2)
Alkaline Phosphatase: 68 U/L (ref 39–117)
BILIRUBIN TOTAL: 0.5 mg/dL (ref 0.2–1.2)
BUN: 24 mg/dL — ABNORMAL HIGH (ref 6–23)
CALCIUM: 9.7 mg/dL (ref 8.4–10.5)
CO2: 32 meq/L (ref 19–32)
Chloride: 104 mEq/L (ref 96–112)
Creatinine, Ser: 1.15 mg/dL (ref 0.40–1.50)
GFR: 65.46 mL/min (ref >60.00)
Glucose, Bld: 104 mg/dL — ABNORMAL HIGH (ref 70–99)
POTASSIUM: 5.3 meq/L — AB (ref 3.5–5.1)
Sodium: 139 mEq/L (ref 135–145)
Total Protein: 6.5 g/dL (ref 6.0–8.3)

## 2015-12-10 LAB — T4, FREE: Free T4: 0.92 ng/dL (ref 0.60–1.60)

## 2015-12-10 LAB — VITAMIN B12: VITAMIN B 12: 764 pg/mL (ref 211–911)

## 2015-12-10 MED ORDER — LORAZEPAM 0.5 MG PO TABS: 0.5000 mg | ORAL_TABLET | Freq: Two times a day (BID) | ORAL | 0 refills | Status: DC | PRN

## 2015-12-10 NOTE — Progress Notes (Signed)
Pre visit review using our clinic review tool, if applicable. No additional management support is needed unless otherwise documented below in the visit note. 

## 2015-12-10 NOTE — Progress Notes (Signed)
   Subjective:    Patient ID: Robert Le, male    DOB: 1940-02-23, 75 y.o.   MRN: [id number]  HPI Here with son, Robert Le for concern about memory problems See yesterday's phone note  2 sons are concerned about memory issues Patient's father had some degree of dementia--sundowning Father had trouble "completing things" and he now notes that about himself  Patient does notice that he has more troubles at the end of the day Will feel "most exhaused" if he has been exposed to stressful things Son notices that he repeats things ---asks the same questions at times This same concern occurred a year ago with Dr Robert Le  He has a Engineer, maintenance (IT) that handles his bills Buys prepared food or goes out He does instrumental ADLs himself Hasn't gotten lost  Does have some depression and sad at times---episodic Fairly regular anxiety--but also not every day  Current Outpatient Prescriptions on File Prior to Visit  Medication Sig Dispense Refill  . amLODipine (NORVASC) 5 MG tablet Take 5 mg by mouth every morning.    . cholecalciferol (VITAMIN D) 1000 UNITS tablet Take 1,000 Units by mouth daily.    . Multiple Vitamin (MULTIVITAMIN WITH MINERALS) TABS tablet Take 1 tablet by mouth daily.     No current facility-administered medications on file prior to visit.     Allergies  Allergen Reactions  . Sulfites     Unable to recall reaction or specific sulfa drug    Past Medical History:  Diagnosis Date  . Anxiety   . Arthritis   . Chronic kidney disease    L TOTAL NEPHRECTOMY  . Hypertension     Past Surgical History:  Procedure Laterality Date  . [place]  . 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 TOTAL HIP ARTHROPLASTY Left 08/20/2013   Procedure: LEFT TOTAL HIP ARTHROPLASTY ANTERIOR APPROACH;  Surgeon: Mauri Pole, MD;  Location: [facility];  Service: Orthopedics;  Laterality: Left;    Family History  Problem Relation Age of Onset    . Heart disease Father   . Arthritis Brother   . Cancer Neg Hx   . Diabetes Neg Hx   . Arthritis Brother     Social History   Social History  . Marital status: Divorced    Spouse name: N/A  . Number of children: 3  . Years of education: N/A   Occupational History  . 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 Smoking status: Never Smoker  . Smokeless tobacco: Never Used  . Alcohol use No  . Drug use: No  . Sexual activity: Not Currently   Other Topics Concern  . Not on file   Social History Narrative   Not sure about advanced directives (may have lost papers in divorce 2015)   Would want brother Robert Le to make decisions for now   Would accept resuscitation attempts   Not sure about tube feeds      Review of Systems Appetite is okay Weight is stable Sleeping okay     Objective:   Physical Exam  Constitutional: He appears well-developed and well-nourished. No distress.  Neurological:  Repeats himself some MOCA--- 13  Psychiatric:  Mild anxiety          Assessment & Plan:

## 2015-12-10 NOTE — Assessment & Plan Note (Signed)
He is very vague in history but has no insight into any problems Repeats himself frequently MOCA 13!! Suggests sig cognitive impairment---with no insight Suggests Alzheimers Will check labs/MRI Follow up 2 weeks to decide on next step

## 2015-12-15 ENCOUNTER — Other Ambulatory Visit: Payer: Self-pay | Admitting: Internal Medicine

## 2015-12-16 ENCOUNTER — Other Ambulatory Visit: Payer: Self-pay

## 2015-12-16 NOTE — Telephone Encounter (Signed)
This is for his MRI and not to be used at other times

## 2015-12-16 NOTE — Telephone Encounter (Signed)
Duplicate see [id number] request.

## 2015-12-16 NOTE — Telephone Encounter (Signed)
Pt came by to request refill lorazepam to [facility] [address] Pt is out of med.pt last seen and filled # 6 on 12/10/15. Pt has f/u appt on 12/21/15 at 11:15. Pt request f/u when med refilled.

## 2015-12-16 NOTE — Telephone Encounter (Signed)
Left detailed message on vm per DPR that this was only for the MRI and not meant to be taken at any other time

## 2015-12-16 NOTE — Telephone Encounter (Signed)
Last filled 12-11-15 #6  Was this for the MRI? I am thinking the pharmacy put it on automatic refill for some reason.

## 2015-12-21 ENCOUNTER — Encounter: Payer: Self-pay | Admitting: Internal Medicine

## 2015-12-21 ENCOUNTER — Ambulatory Visit (INDEPENDENT_AMBULATORY_CARE_PROVIDER_SITE_OTHER): Payer: Medicare Other | Admitting: Internal Medicine

## 2015-12-21 VITALS — BP 118/82 | HR 84 | Temp 97.6°F | Wt 153.0 lb

## 2015-12-21 DIAGNOSIS — F0280 Dementia in other diseases classified elsewhere without behavioral disturbance: Secondary | ICD-10-CM | POA: Diagnosis not present

## 2015-12-21 DIAGNOSIS — F03C0 Unspecified dementia, severe, without behavioral disturbance, psychotic disturbance, mood disturbance, and anxiety: Secondary | ICD-10-CM | POA: Insufficient documentation

## 2015-12-21 DIAGNOSIS — G301 Alzheimer's disease with late onset: Secondary | ICD-10-CM | POA: Diagnosis not present

## 2015-12-21 DIAGNOSIS — G309 Alzheimer's disease, unspecified: Secondary | ICD-10-CM

## 2015-12-21 MED ORDER — DONEPEZIL HCL 5 MG PO TABS: 5.0000 mg | ORAL_TABLET | Freq: Every day | ORAL | 11 refills | Status: DC

## 2015-12-21 NOTE — Patient Instructions (Signed)
Please pick up the new medication, donepezil, and take it every day. Let me know if you have any problems with it.

## 2015-12-21 NOTE — Progress Notes (Signed)
Pre visit review using our clinic review tool, if applicable. No additional management support is needed unless otherwise documented below in the visit note. 

## 2015-12-21 NOTE — Assessment & Plan Note (Signed)
MRI pending but I don't expect NPH Even if vascular changes--it is worth a trial with donepezil, so will try

## 2015-12-21 NOTE — Progress Notes (Signed)
   Subjective:    Patient ID: Robert Le, male    DOB: Jun 02, 1940, 75 y.o.   MRN: [id number]  HPI Here for follow up about dementia Reviewed situation with [facility] folks by email Drove himself here today He doesn't remember any review from [facility]  Current Outpatient Prescriptions on File Prior to Visit  Medication Sig Dispense Refill  . amLODipine (NORVASC) 5 MG tablet Take 5 mg by mouth every morning.    . Multiple Vitamin (MULTIVITAMIN WITH MINERALS) TABS tablet Take 1 tablet by mouth daily.    . cholecalciferol (VITAMIN D) 1000 UNITS tablet Take 1,000 Units by mouth daily.    Marland Kitchen LORazepam (ATIVAN) 0.5 MG tablet Take 1 tablet (0.5 mg total) by mouth 2 (two) times daily as needed for anxiety. (Patient not taking: Reported on 12/21/2015) 6 tablet 0   No current facility-administered medications on file prior to visit.     Allergies  Allergen Reactions  . Sulfites     Unable to recall reaction or specific sulfa drug    Past Medical History:  Diagnosis Date  . Anxiety   . Arthritis   . Chronic kidney disease    L TOTAL NEPHRECTOMY  . Hypertension     Past Surgical History:  Procedure Laterality Date  . [place]  . 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 Heart disease Father   . Arthritis Brother   . Cancer Neg Hx   . Diabetes Neg Hx   . Arthritis Brother     Social History   Social History  . Marital status: Divorced    Spouse name: N/A  . Number of children: 3  . Years of education: N/A   Occupational History  . 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 Smoking status: Never Smoker  . Smokeless tobacco: Never Used  . Alcohol  use No  . Drug use: No  . Sexual activity: Not Currently   Other Topics Concern  . Not on file   Social History Narrative   Not sure about advanced directives (may have lost papers in divorce 2015)   Would want brother [name] to make decisions for now   Would accept resuscitation attempts   Not sure about tube feeds      Review of Systems  Appetite is okay Sleeping well Gets prepared meals mostly--and some light cooking     Objective:   Physical Exam  Psychiatric: He has a normal mood and affect. His behavior is normal.          Assessment & Plan:

## 2015-12-22 ENCOUNTER — Ambulatory Visit (HOSPITAL_COMMUNITY)
Admission: RE | Admit: 2015-12-22 | Discharge: 2015-12-22 | Disposition: A | Discharge status: Home / Self Care | Payer: Medicare Other | Source: Ambulatory Visit | Attending: Internal Medicine | Admitting: Internal Medicine

## 2015-12-22 DIAGNOSIS — R413 Other amnesia: Secondary | ICD-10-CM | POA: Insufficient documentation

## 2015-12-23 ENCOUNTER — Ambulatory Visit: Payer: Medicare Other | Admitting: Internal Medicine

## 2016-01-22 ENCOUNTER — Ambulatory Visit: Payer: Medicare Other | Admitting: Internal Medicine

## 2016-02-09 ENCOUNTER — Ambulatory Visit (INDEPENDENT_AMBULATORY_CARE_PROVIDER_SITE_OTHER): Payer: Medicare Other | Admitting: Internal Medicine

## 2016-02-09 ENCOUNTER — Encounter: Payer: Self-pay | Admitting: Internal Medicine

## 2016-02-09 VITALS — BP 122/76 | HR 71 | Temp 98.1°F | Wt 150.0 lb

## 2016-02-09 DIAGNOSIS — G301 Alzheimer's disease with late onset: Secondary | ICD-10-CM | POA: Diagnosis not present

## 2016-02-09 DIAGNOSIS — F39 Unspecified mood [affective] disorder: Secondary | ICD-10-CM

## 2016-02-09 DIAGNOSIS — F0280 Dementia in other diseases classified elsewhere without behavioral disturbance: Secondary | ICD-10-CM | POA: Diagnosis not present

## 2016-02-09 NOTE — Assessment & Plan Note (Signed)
Gets anxious at times Will renew the lorazepam for occasional use

## 2016-02-09 NOTE — Patient Instructions (Signed)
Please set up a follow up in about 2 months. You need to have one of your sons with you----or have them available by phone. Please get their phone numbers for our records.

## 2016-02-09 NOTE — Progress Notes (Signed)
Pre visit review using our clinic review tool, if applicable. No additional management support is needed unless otherwise documented below in the visit note. 

## 2016-02-09 NOTE — Assessment & Plan Note (Signed)
May be some better--but hard to tell Neither son is here--and he doesn't know their phone numbers Still drives locally in the day No action at [facility] had problems there apparently Might benefit from increasing the donepezil--will hold off for now (till I can review with his sons)

## 2016-02-09 NOTE — Progress Notes (Signed)
   Subjective:    Patient ID: Robert Le, male    DOB: October 28, 1940, 76 y.o.   MRN: [id number]  HPI Here for follow up of Alzheimers Sons not here  Taking the donepezil No apparent problems He has limited insight--no clear difference to him  Finds the lorazepam very helpful Reduces his nerves Taking twice a week or so  Current Outpatient Prescriptions on File Prior to Visit  Medication Sig Dispense Refill  . amLODipine (NORVASC) 5 MG tablet Take 5 mg by mouth every morning.    . cholecalciferol (VITAMIN D) 1000 UNITS tablet Take 1,000 Units by mouth daily.    Marland Kitchen donepezil (ARICEPT) 5 MG tablet Take 1 tablet (5 mg total) by mouth at bedtime. 30 tablet 11  . Multiple Vitamin (MULTIVITAMIN WITH MINERALS) TABS tablet Take 1 tablet by mouth daily.     No current facility-administered medications on file prior to visit.     Allergies  Allergen Reactions  . Sulfites     Unable to recall reaction or specific sulfa drug    Past Medical History:  Diagnosis Date  . Anxiety   . Arthritis   . Chronic kidney disease    L TOTAL NEPHRECTOMY  . Hypertension     Past Surgical History:  Procedure Laterality Date  . [place]  . 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 Heart disease Father   . Arthritis Brother   . Cancer Neg Hx   . Diabetes Neg Hx   . Arthritis Brother     Social History   Social History  . Marital status: Divorced    Spouse name: N/A  . Number of children: 3  . Years of education: N/A   Occupational History  . 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 Smoking status: Never Smoker  . Smokeless tobacco: Never Used  . Alcohol use No    . Drug use: No  . Sexual activity: Not Currently   Other Topics Concern  . Not on file   Social History Narrative   Not sure about advanced directives (may have lost papers in divorce 2015)   Would want brother [name] to make decisions for now   Would accept resuscitation attempts   Not sure about tube feeds      Review of Systems Appetite is okay Weight down slightly Sleeps okay    Objective:   Physical Exam  Constitutional: He appears well-developed and well-nourished. No distress.  Neurological:  Jan 9th, 2018 President-- ? [id number]  Psychiatric: He has a normal mood and affect. His behavior is normal.          Assessment & Plan:

## 2016-03-03 ENCOUNTER — Telehealth: Payer: Self-pay | Admitting: Internal Medicine

## 2016-03-03 NOTE — Telephone Encounter (Signed)
Phone call from son Merry Proud Patient still calls him with anxiety---but related to him forgetting things. Wonders about restarting clonazepam that he had before. I mentioned my concerns about him taking medication correctly.  Will hold off on more meds now.  Would have less anxiety in [place]--if he would accept this Should take his keys and cars away He will come to [place] in March (or his brother) Eliott Nine him to contact [place] to see how he is doing from their standpoint.

## 2016-03-09 ENCOUNTER — Telehealth: Payer: Self-pay | Admitting: Internal Medicine

## 2016-03-09 NOTE — Telephone Encounter (Signed)
Patient Name: Robert Le  DOB: 29-Sep-1940    Initial Comment Caller States they are having headaches.   Nurse Assessment  Nurse: Jimmey Ralph, RN, Lissa Date/Time (Eastern Time): 03/09/2016 4:01:26 PM  Confirm and document reason for call. If symptomatic, describe symptoms. ---Caller states: Something hit me on the top of head and I didn't black out. The people I am with made me call. No bleeding. I was in the garage and I was raising up and bumped my head. It stung.  Does the patient have any new or worsening symptoms? ---Yes  Will a triage be completed? ---Yes  Related visit to physician within the last 2 weeks? ---No  Does the PT have any chronic conditions? (i.e. diabetes, asthma, etc.) ---No  Is this a behavioral health or substance abuse call? ---No     Guidelines    Guideline Title Affirmed Question Affirmed Notes  Head Injury [1] Last tetanus shot > 5 years ago AND [2] DIRTY cut or scrape    Final Disposition User   See PCP When Office is Open (within 3 days) Hammonds, RN, Lissa    Comments  Caller will contact office in am to follow up with scheduling a tetanus booster. Caller hit head on dirty garage bench and hit something that caused minor scalp injury with bleeding that stopped within 10 min.   Referrals  REFERRED TO PCP OFFICE   Disagree/Comply: Comply

## 2016-03-09 NOTE — Telephone Encounter (Signed)
[facility]  Patient Name: Robert Le  DOB: 1940/07/03    Initial Comment Caller States they are having headaches.   Nurse Assessment  Nurse: Jimmey Ralph, RN, Lissa Date/Time (Eastern Time): 03/09/2016 4:01:26 PM  Confirm and document reason for call. If symptomatic, describe symptoms. ---Caller states: Something hit me on the top of head and I didn't black out. The people I am with made me call. No bleeding. I was in the garage and I was raising up and bumped my head. It stung.  Does the patient have any new or worsening symptoms? ---Yes  Will a triage be completed? ---Yes  Related visit to physician within the last 2 weeks? ---No  Does the PT have any chronic conditions? (i.e. diabetes, asthma, etc.) ---No  Is this a behavioral health or substance abuse call? ---No     Guidelines    Guideline Title Affirmed Question Affirmed Notes  Head Injury [1] Last tetanus shot > 5 years ago AND [2] DIRTY cut or scrape    Final Disposition User   See PCP When Office is Open (within 3 days) Hammonds, RN, Lissa    Comments  Caller will contact office in am to follow up with scheduling a tetanus booster. Caller hit head on dirty garage bench and hit something that caused minor scalp injury with bleeding that stopped within 10 min.   Referrals  REFERRED TO PCP OFFICE   Disagree/Comply: Comply

## 2016-03-11 NOTE — Telephone Encounter (Signed)
Unable to reach pt by phone.Please advise.

## 2016-03-11 NOTE — Telephone Encounter (Signed)
Pt aware to call for Td, no appt needed.

## 2016-03-13 ENCOUNTER — Other Ambulatory Visit: Payer: Self-pay | Admitting: Internal Medicine

## 2016-03-14 NOTE — Telephone Encounter (Signed)
Left refill on voice mail at pharmacy  

## 2016-03-14 NOTE — Telephone Encounter (Signed)
Last filled 02-15-16 #6 He only takes its about twice a week.  Last OV 02-09-16 Next OV 04-26-16  Forward to Dr Diona Browner in Dr Alla German absence

## 2016-03-21 ENCOUNTER — Telehealth: Payer: Self-pay | Admitting: Internal Medicine

## 2016-03-21 MED ORDER — LORAZEPAM 0.5 MG PO TABS: 0.5000 mg | ORAL_TABLET | Freq: Two times a day (BID) | ORAL | 0 refills | Status: DC | PRN

## 2016-03-21 NOTE — Telephone Encounter (Signed)
Pts son Robert Le called about pts Lorazapam rx.  He knows that Dr. Silvio Pate wrote an rx for it last week but is requesting more.  Pt is feeling some anxiety and would like to take some more.  Can you please contact Robert Le to let him know if this request can be fulfilled.

## 2016-03-21 NOTE — Telephone Encounter (Signed)
Okay to send Rx for #30 x 0 Set up appt soon ---at a time that one of his son's can come along (to review everything and the need for the tranquilizer)

## 2016-03-21 NOTE — Telephone Encounter (Signed)
Spoke to pt's son, Merry Proud. He is coming with his Dad on 04-26-16. Left refill on voice mail at pharmacy

## 2016-04-25 ENCOUNTER — Encounter: Payer: Self-pay | Admitting: Internal Medicine

## 2016-04-25 ENCOUNTER — Encounter: Payer: PRIVATE HEALTH INSURANCE | Admitting: Internal Medicine

## 2016-04-25 ENCOUNTER — Ambulatory Visit (INDEPENDENT_AMBULATORY_CARE_PROVIDER_SITE_OTHER): Payer: Medicare Other | Admitting: Internal Medicine

## 2016-04-25 ENCOUNTER — Encounter (INDEPENDENT_AMBULATORY_CARE_PROVIDER_SITE_OTHER): Payer: Self-pay

## 2016-04-25 VITALS — BP 138/90 | HR 88 | Temp 97.9°F | Ht 67.5 in | Wt 151.0 lb

## 2016-04-25 DIAGNOSIS — F39 Unspecified mood [affective] disorder: Secondary | ICD-10-CM | POA: Diagnosis not present

## 2016-04-25 DIAGNOSIS — F0280 Dementia in other diseases classified elsewhere without behavioral disturbance: Secondary | ICD-10-CM

## 2016-04-25 DIAGNOSIS — G301 Alzheimer's disease with late onset: Secondary | ICD-10-CM

## 2016-04-25 DIAGNOSIS — Z0000 Encounter for general adult medical examination without abnormal findings: Secondary | ICD-10-CM | POA: Diagnosis not present

## 2016-04-25 DIAGNOSIS — I10 Essential (primary) hypertension: Secondary | ICD-10-CM

## 2016-04-25 DIAGNOSIS — Z7189 Other specified counseling: Secondary | ICD-10-CM

## 2016-04-25 DIAGNOSIS — Z515 Encounter for palliative care: Secondary | ICD-10-CM | POA: Insufficient documentation

## 2016-04-25 MED ORDER — LORAZEPAM 0.5 MG PO TABS: 0.5000 mg | ORAL_TABLET | Freq: Two times a day (BID) | ORAL | 0 refills | Status: DC | PRN

## 2016-04-25 NOTE — Assessment & Plan Note (Signed)
Still able to live independently at [facility] locally only Asked son to establish communication with Education officer, museum and nurses at [facility]

## 2016-04-25 NOTE — Assessment & Plan Note (Signed)
BP Readings from Last 3 Encounters:  04/25/16 138/90  02/09/16 122/76  12/21/15 118/82   Fair control Would not increase

## 2016-04-25 NOTE — Progress Notes (Signed)
Subjective:    Patient ID: Robert Le, male    DOB: 12-07-1940, 76 y.o.   MRN: [id number]  HPI Here for Medicare wellness and follow up of chronic health conditions Son Robert Le is with him Reviewed form and advanced directives Reviewed advanced directives No alcohol or tobacco Tries to stay fit and exercise Vision and hearing are okay Had 1 fall this year--no injury Still independent with instrumental ADLs Ongoing dementia  Still living alone Has stopped driving outside of [place]---sons Le with him about this Not practical to have someone with him He has been driving son around--and he is comfortable Microwaves food mostly--- gets food from the Robert Le  Son not clear if he has been taking his medication Discussed setting up pill containers--may need [facility] nurse to do this  Still gets anxious at times---feels on edge all the time and worsens at times Lorazepam really helps No depression or anhedonia  No chest pain No SOB No dizziness or syncope No edema  Current Outpatient Prescriptions on File Prior to Visit  Medication Sig Dispense Refill  . amLODipine (NORVASC) 5 MG tablet Take 5 mg by mouth every morning.    . cholecalciferol (VITAMIN D) 1000 UNITS tablet Take 1,000 Units by mouth daily.    Marland Kitchen donepezil (ARICEPT) 5 MG tablet Take 1 tablet (5 mg total) by mouth at bedtime. 30 tablet 11  . LORazepam (ATIVAN) 0.5 MG tablet Take 1 tablet (0.5 mg total) by mouth 2 (two) times daily as needed. for anxiety 30 tablet 0  . Multiple Vitamin (MULTIVITAMIN WITH MINERALS) TABS tablet Take 1 tablet by mouth daily.     No current facility-administered medications on file prior to visit.     Allergies  Allergen Reactions  . Sulfites     Unable to recall reaction or specific sulfa drug    Past Medical History:  Diagnosis Date  . Anxiety   . Arthritis   . Chronic kidney disease    L TOTAL NEPHRECTOMY  . Hypertension     Past Surgical History:    Procedure Laterality Date  . [place]  . 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 Heart disease Father   . Arthritis Brother   . Arthritis Brother   . Cancer Neg Hx   . Diabetes Neg Hx     Social History   Social History  . Marital status: Divorced    Spouse name: N/A  . Number of children: 3  . Years of education: N/A   Occupational History  . 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 Smoking status: Never Smoker  . Smokeless tobacco: Never Used  . Alcohol use No  . Drug use: No  . Sexual activity: Not Currently   Other Topics Concern  . Not on file   Social History Narrative   Not sure about advanced directives   Would want sons to make decisions   Would accept resuscitation attempts   Not sure about tube feeds      Review of Systems He has been working on eating better Weight is up 2# Sleeping okay Wears  seat belt in car Bowels are fine Voids okay. Nocturia is intermittent. Flow is okay No rash or suspicious skin lesions No heartburn or dysphagia Teeth okay---keeps up with dentist (Dr Kathleen Argue)    Objective:   Physical Exam  Constitutional: He appears well-developed and well-nourished. No distress.  HENT:  Mouth/Throat: Oropharynx is clear and moist. No oropharyngeal exudate.  Neck: No thyromegaly present.  Cardiovascular: Normal rate, regular rhythm, normal heart sounds and intact distal pulses.  Exam reveals no gallop.   No murmur heard. Pulmonary/Chest: Effort normal and breath sounds normal. No respiratory distress. He has no wheezes. He has no rales.  Abdominal: Soft. There is no tenderness.  Musculoskeletal: He exhibits no edema  or tenderness.  Lymphadenopathy:    He has no cervical adenopathy.  Skin: No rash noted. No erythema.  Psychiatric: He has a normal mood and affect. His behavior is normal.          Assessment & Plan:

## 2016-04-25 NOTE — Assessment & Plan Note (Signed)
See social history Blank forms given to son

## 2016-04-25 NOTE — Assessment & Plan Note (Signed)
Mostly anxiety Using the lorazepam with good effect Chronic persistent symptoms but only needs the med occasionally

## 2016-04-25 NOTE — Progress Notes (Signed)
Pre visit review using our clinic review tool, if applicable. No additional management support is needed unless otherwise documented below in the visit note. 

## 2016-04-25 NOTE — Assessment & Plan Note (Signed)
I have personally reviewed the Medicare Annual Wellness questionnaire and have noted 1. The patient's medical and social history 2. Their use of alcohol, tobacco or illicit drugs 3. Their current medications and supplements 4. The patient's functional ability including ADL's, fall risks, home safety risks and hearing or visual             impairment. 5. Diet and physical activities 6. Evidence for depression or mood disorders  The patients weight, height, BMI and visual acuity have been recorded in the chart I have made referrals, counseling and provided education to the patient based review of the above and I have provided the pt with a written personalized care plan for preventive services.  I have provided you with a copy of your personalized plan for preventive services. Please take the time to review along with your updated medication list.  No cancer screening due to age Yearly flu vaccine Exercises regularly

## 2016-04-26 ENCOUNTER — Encounter: Payer: PRIVATE HEALTH INSURANCE | Admitting: Internal Medicine

## 2016-05-05 ENCOUNTER — Other Ambulatory Visit: Payer: Self-pay

## 2016-05-05 MED ORDER — AMLODIPINE BESYLATE 5 MG PO TABS: 5.0000 mg | ORAL_TABLET | Freq: Every morning | ORAL | 11 refills | Status: DC

## 2016-05-05 MED ORDER — ADULT MULTIVITAMIN W/MINERALS CH
1.0000 | ORAL_TABLET | Freq: Every day | ORAL | 11 refills | Status: DC
Start: 2016-05-05 — End: 2021-07-15

## 2016-05-05 MED ORDER — ADULT MULTIVITAMIN W/MINERALS CH: 1.0000 | ORAL_TABLET | Freq: Every day | ORAL | 11 refills | Status: DC

## 2016-05-05 MED ORDER — VITAMIN D 1000 UNITS PO TABS: 1000.0000 [IU] | ORAL_TABLET | Freq: Every day | ORAL | 11 refills | Status: DC

## 2016-05-05 MED ORDER — VITAMIN D 1000 UNITS PO TABS
1000.0000 [IU] | ORAL_TABLET | Freq: Every day | ORAL | 11 refills | Status: DC
Start: 2016-05-05 — End: 2021-04-20

## 2016-05-05 NOTE — Telephone Encounter (Signed)
Robert Le with [facility] called; pt is new client for [facility] and at the end of the month [facility] will prepare pts med which will be packaged and delivered to [facility]. Robert Le will get transfer of donepezil from [facility] but other meds had no available refills. Annual exam 04/25/16. Refilling amlodipine, vit D and multivitamin with minerals per protocol to [facility] and send request for lorazepam to Dr Silvio Pate.

## 2016-05-06 MED ORDER — LORAZEPAM 0.5 MG PO TABS: 0.5000 mg | ORAL_TABLET | Freq: Two times a day (BID) | ORAL | 0 refills | Status: DC | PRN

## 2016-05-06 NOTE — Telephone Encounter (Signed)
Approved: okay others for 1 year Lorazepam #30 x 0

## 2016-05-06 NOTE — Telephone Encounter (Signed)
Spoke to [place] with Lorazepam refill

## 2016-05-20 ENCOUNTER — Other Ambulatory Visit: Payer: Self-pay | Admitting: Internal Medicine

## 2016-05-20 NOTE — Telephone Encounter (Signed)
Approved: 30 x 0 

## 2016-05-20 NOTE — Telephone Encounter (Signed)
Left refill on voice mail at pharmacy  

## 2016-05-20 NOTE — Telephone Encounter (Signed)
Last filled 04-25-16 #30 Last OV 04-25-16 Next OV 10-24-16

## 2016-05-31 ENCOUNTER — Other Ambulatory Visit: Payer: Self-pay | Admitting: Internal Medicine

## 2016-05-31 NOTE — Telephone Encounter (Signed)
Last Rx 05/20/2016. Last OV 03/2016. pls advise

## 2016-05-31 NOTE — Telephone Encounter (Signed)
He has dementia but very little insight This is supposed to be just once in a while See if you can get him to remember why is taking it this much

## 2016-06-02 NOTE — Telephone Encounter (Signed)
Spoke to pt who states he did not make request and is not needing a refill at this time.

## 2016-06-02 NOTE — Telephone Encounter (Signed)
Okay--just deny it

## 2016-06-24 ENCOUNTER — Other Ambulatory Visit: Payer: Self-pay | Admitting: Internal Medicine

## 2016-06-24 NOTE — Telephone Encounter (Signed)
Ok to phone in Ativan 

## 2016-06-24 NOTE — Telephone Encounter (Signed)
Lorazepam called into [place] [address] Phone: [phone]

## 2016-06-24 NOTE — Telephone Encounter (Signed)
Last office visit 04/25/2016.  Last refilled 05/20/2016 for #30 with no refills  Ok to refill?

## 2016-06-30 ENCOUNTER — Encounter: Payer: Self-pay | Admitting: Internal Medicine

## 2016-07-06 ENCOUNTER — Encounter: Payer: Self-pay | Admitting: Internal Medicine

## 2016-07-06 ENCOUNTER — Other Ambulatory Visit: Payer: Self-pay | Admitting: Internal Medicine

## 2016-07-20 ENCOUNTER — Encounter: Payer: Self-pay | Admitting: Internal Medicine

## 2016-07-22 ENCOUNTER — Other Ambulatory Visit: Payer: Self-pay | Admitting: Internal Medicine

## 2016-07-25 NOTE — Telephone Encounter (Signed)
Verbal refill given to [facility].

## 2016-07-25 NOTE — Telephone Encounter (Signed)
Approved: 30 x 0 

## 2016-07-25 NOTE — Telephone Encounter (Signed)
Last filled 06-24-16 #30 Last OV 04-25-16 Next OV 10-31-16

## 2016-08-02 ENCOUNTER — Encounter: Payer: Self-pay | Admitting: Internal Medicine

## 2016-08-15 ENCOUNTER — Other Ambulatory Visit: Payer: Self-pay | Admitting: Internal Medicine

## 2016-08-15 NOTE — Telephone Encounter (Signed)
Last filled 07-28-16 #30 Last OV 04-25-16 Next OV 10-31-16

## 2016-08-15 NOTE — Telephone Encounter (Signed)
Approved: 30 x 0 

## 2016-08-15 NOTE — Telephone Encounter (Signed)
Verbal refill given to Robert Le at the pharmacy

## 2016-09-08 ENCOUNTER — Other Ambulatory Visit: Payer: Self-pay | Admitting: Internal Medicine

## 2016-09-08 NOTE — Telephone Encounter (Signed)
Rx called in to pharmacy. 

## 2016-09-08 NOTE — Telephone Encounter (Signed)
Last filled 08-15-16 #30 Last OV 04-25-16 Next OV 10-31-16

## 2016-09-08 NOTE — Telephone Encounter (Signed)
Approved: 30 x 0 

## 2016-10-05 ENCOUNTER — Other Ambulatory Visit: Payer: Self-pay | Admitting: Internal Medicine

## 2016-10-05 NOTE — Telephone Encounter (Signed)
Last filled 09-12-16 #30 Last OV 04-25-16 Next OV 10-31-16

## 2016-10-05 NOTE — Telephone Encounter (Signed)
Verbal refill given to Debbie at the pharmacy

## 2016-10-05 NOTE — Telephone Encounter (Signed)
Approved: 30 x 0 

## 2016-10-24 ENCOUNTER — Ambulatory Visit: Payer: Medicare Other | Admitting: Internal Medicine

## 2016-10-31 ENCOUNTER — Ambulatory Visit: Payer: Medicare Other | Admitting: Internal Medicine

## 2016-11-07 ENCOUNTER — Ambulatory Visit: Payer: Medicare Other | Admitting: Internal Medicine

## 2016-11-07 ENCOUNTER — Other Ambulatory Visit: Payer: Self-pay | Admitting: Internal Medicine

## 2016-11-07 NOTE — Telephone Encounter (Signed)
Last filled 10-05-16 #30 Last OV 04-25-16 Next OV 11-14-16

## 2016-11-07 NOTE — Telephone Encounter (Signed)
Approved: 30 x 0 

## 2016-11-07 NOTE — Telephone Encounter (Signed)
Verbal refill given to Robert Le at pharmacy

## 2016-11-10 ENCOUNTER — Encounter: Payer: Self-pay | Admitting: Internal Medicine

## 2016-11-14 ENCOUNTER — Ambulatory Visit (INDEPENDENT_AMBULATORY_CARE_PROVIDER_SITE_OTHER): Payer: Medicare Other | Admitting: Internal Medicine

## 2016-11-14 ENCOUNTER — Encounter: Payer: Self-pay | Admitting: Internal Medicine

## 2016-11-14 VITALS — BP 110/80 | HR 80 | Temp 98.0°F | Wt 152.0 lb

## 2016-11-14 DIAGNOSIS — F0280 Dementia in other diseases classified elsewhere without behavioral disturbance: Secondary | ICD-10-CM | POA: Diagnosis not present

## 2016-11-14 DIAGNOSIS — G301 Alzheimer's disease with late onset: Secondary | ICD-10-CM | POA: Diagnosis not present

## 2016-11-14 DIAGNOSIS — Z23 Encounter for immunization: Secondary | ICD-10-CM

## 2016-11-14 NOTE — Progress Notes (Signed)
   Subjective:    Patient ID: Robert Le, male    DOB: December 06, 1940, 76 y.o.   MRN: [id number]  HPI Here with son Merry Proud Other son not allowed--patient has turned on him (for no good reason)  Discussed that he cannot drive Car no longer at his place Still getting medications wrong--despite staff efforts  See all the emails  He "feels great" No pain or SOB or other issues  Current Outpatient Prescriptions on File Prior to Visit  Medication Sig Dispense Refill  . amLODipine (NORVASC) 5 MG tablet Take 1 tablet (5 mg total) by mouth every morning. 30 tablet 11  . cholecalciferol (VITAMIN D) 1000 units tablet Take 1 tablet (1,000 Units total) by mouth daily. 30 tablet 11  . LORazepam (ATIVAN) 0.5 MG tablet TAKE 1 TABLET BY MOUTH TWICE A DAY AS NEEDED FOR ANXIETY 30 tablet 0  . Multiple Vitamin (MULTIVITAMIN WITH MINERALS) TABS tablet Take 1 tablet by mouth daily. 30 tablet 11   No current facility-administered medications on file prior to visit.     Allergies  Allergen Reactions  . Donepezil Other (See Comments)    Increased confusion  . Sulfites     Unable to recall reaction or specific sulfa drug    Past Medical History:  Diagnosis Date  . Anxiety   . Arthritis   . Chronic kidney disease    L TOTAL NEPHRECTOMY  . Hypertension     Past Surgical History:  Procedure Laterality Date  . [place]  . 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 Heart disease Father   . Arthritis Brother   . Arthritis Brother   . Cancer Neg Hx   . Diabetes Neg Hx     Social History   Social History  . Marital status: Divorced    Spouse name: N/A  . Number of children: 3  . Years of  education: N/A   Occupational History  . 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 Smoking status: Never Smoker  . Smokeless tobacco: Never Used  . Alcohol use No  . Drug use: No  . Sexual activity: Not Currently   Other Topics Concern  . Not on file   Social History Narrative   Not sure about advanced directives   Would want sons to make decisions   Would accept resuscitation attempts   Not sure about tube feeds      Review of Systems  Sleeps well Appetite is good Weight is stable Still exercises regularly    Objective:   Physical Exam  Constitutional: He appears well-nourished. No distress.  Looks great as usual  Neurological:  Continues to have remarkable lack of insight          Assessment & Plan:

## 2016-11-14 NOTE — Assessment & Plan Note (Signed)
See all the emails and prior documentation about his decline Discussed that he will have to move (despite him not wanting to) and he wants to maintain control Recommended he take assisted living apt available now Counseled on this and why for all of 25 minute visit

## 2016-11-24 DIAGNOSIS — Z23 Encounter for immunization: Secondary | ICD-10-CM | POA: Diagnosis not present

## 2016-12-15 ENCOUNTER — Encounter: Payer: Self-pay | Admitting: Internal Medicine

## 2016-12-15 ENCOUNTER — Ambulatory Visit: Payer: Medicare Other | Admitting: Internal Medicine

## 2016-12-15 VITALS — BP 136/80 | HR 70 | Temp 96.9°F | Resp 16 | Wt 154.0 lb

## 2016-12-15 DIAGNOSIS — M1611 Unilateral primary osteoarthritis, right hip: Secondary | ICD-10-CM

## 2016-12-15 DIAGNOSIS — F39 Unspecified mood [affective] disorder: Secondary | ICD-10-CM | POA: Diagnosis not present

## 2016-12-15 DIAGNOSIS — G301 Alzheimer's disease with late onset: Secondary | ICD-10-CM

## 2016-12-15 DIAGNOSIS — I10 Essential (primary) hypertension: Secondary | ICD-10-CM | POA: Diagnosis not present

## 2016-12-15 DIAGNOSIS — F0280 Dementia in other diseases classified elsewhere without behavioral disturbance: Secondary | ICD-10-CM

## 2016-12-15 NOTE — Assessment & Plan Note (Signed)
No pain issues now Walking okay No Rx needed

## 2016-12-15 NOTE — Assessment & Plan Note (Signed)
Seems to have acclimated well here Will need to monitor his movements but seems to be safe at this point (certainly better [place] in [place]) No Rx needed

## 2016-12-15 NOTE — Progress Notes (Signed)
Subjective:    Patient ID: Robert Le, male    DOB: 08-03-1940, 76 y.o.   MRN: [id number]  HPI Initial visit since his move to assisted living last week Has adjusted fairly well per Luellen Pucker RN Was found downstairs in lobby at 2:30AM once----went back to his room without incident once he was prompted  He thinks he has been here for 3 weeks or more He voices satisfaction thus far No problem getting downstairs for the meals  No dizziness No chest pain or SOB No edema No palpitations  No depression Anxiety seems better here---hasn't needed the lorazepam over the past 5 days  Current Outpatient Medications on File Prior to Visit  Medication Sig Dispense Refill  . amLODipine (NORVASC) 5 MG tablet Take 1 tablet (5 mg total) by mouth every morning. 30 tablet 11  . cholecalciferol (VITAMIN D) 1000 units tablet Take 1 tablet (1,000 Units total) by mouth daily. 30 tablet 11  . LORazepam (ATIVAN) 0.5 MG tablet TAKE 1 TABLET BY MOUTH TWICE A DAY AS NEEDED FOR ANXIETY 30 tablet 0  . Multiple Vitamin (MULTIVITAMIN WITH MINERALS) TABS tablet Take 1 tablet by mouth daily. 30 tablet 11   No current facility-administered medications on file prior to visit.     Allergies  Allergen Reactions  . Donepezil Other (See Comments)    Increased confusion  . Sulfites     Unable to recall reaction or specific sulfa drug    Past Medical History:  Diagnosis Date  . Anxiety   . Arthritis   . Chronic kidney disease    L TOTAL NEPHRECTOMY  . Hypertension     Past Surgical History:  Procedure Laterality Date  . [place]  . 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 Heart disease Father   .  Arthritis Brother   . Arthritis Brother   . Cancer Neg Hx   . Diabetes Neg Hx     Social History   Socioeconomic History  . Marital status: Divorced    Spouse name: Not on file  . Number of children: 3  . Years of education: Not on file  . Highest education level: Not on file  Social Needs  . Financial resource strain: Not on file  . Food insecurity - worry: Not on file  . Food insecurity - inability: Not on file  . Transportation needs - medical: Not on file  . Transportation needs - non-medical: Not on file  Occupational History  . Occupation: Tax adviser    Comment: Retired  Tobacco Use  . Smoking status: Never Smoker  . Smokeless tobacco: Never Used  Substance and Sexual Activity  . Alcohol use: No  . Drug use: No  . Sexual activity: Not Currently  Other Topics Concern  . Not on file  Social History Narrative   Not sure about advanced directives   Would want sons to make decisions   Would accept resuscitation attempts   Not sure about tube feeds   Review of Systems Appetite is good Weight stable Still getting out for his walks Bowels are fine No pain issues    Objective:   Physical Exam  Constitutional: He appears well-developed. No distress.  Neck: No thyromegaly present.  Cardiovascular: Normal rate, regular rhythm and normal heart sounds. Exam reveals no gallop.  No murmur heard. Pulmonary/Chest: Effort normal and breath sounds normal. No respiratory distress. He has no wheezes. He has no rales.  Abdominal: Soft. There is no tenderness.  Musculoskeletal: He exhibits no edema or tenderness.  Psychiatric: He has a normal mood and affect. His behavior is normal.          Assessment & Plan:

## 2016-12-15 NOTE — Assessment & Plan Note (Signed)
BP Readings from Last 3 Encounters:  12/15/16 136/80  11/14/16 110/80  04/25/16 138/90   Good control No medication changes needed

## 2016-12-15 NOTE — Assessment & Plan Note (Signed)
Hasn't needed the lorazepam in the past few days Anxiety may be better with the support he has here (I believe a lot of his anxiety was subconscious discomfort due to not being able to manage at home)

## 2017-03-03 DIAGNOSIS — M545 Low back pain: Secondary | ICD-10-CM | POA: Diagnosis not present

## 2017-03-24 ENCOUNTER — Ambulatory Visit: Payer: Medicare Other | Admitting: Internal Medicine

## 2017-03-24 DIAGNOSIS — G301 Alzheimer's disease with late onset: Secondary | ICD-10-CM | POA: Diagnosis not present

## 2017-03-24 DIAGNOSIS — F39 Unspecified mood [affective] disorder: Secondary | ICD-10-CM | POA: Diagnosis not present

## 2017-03-24 DIAGNOSIS — I10 Essential (primary) hypertension: Secondary | ICD-10-CM | POA: Diagnosis not present

## 2017-03-24 DIAGNOSIS — F0280 Dementia in other diseases classified elsewhere without behavioral disturbance: Secondary | ICD-10-CM

## 2017-04-07 ENCOUNTER — Encounter: Payer: Self-pay | Admitting: Internal Medicine

## 2017-04-07 NOTE — Assessment & Plan Note (Signed)
Mild No meds Needs ALF

## 2017-04-07 NOTE — Assessment & Plan Note (Signed)
Continue Ativan prn

## 2017-04-07 NOTE — Assessment & Plan Note (Signed)
Controlled on Amlodipine Will monitor 

## 2017-04-07 NOTE — Patient Instructions (Signed)
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
## 2017-05-25 ENCOUNTER — Encounter: Payer: Self-pay | Admitting: Internal Medicine

## 2017-05-25 ENCOUNTER — Ambulatory Visit: Payer: Medicare Other | Admitting: Internal Medicine

## 2017-05-25 VITALS — BP 135/86 | HR 72 | Temp 97.8°F | Resp 18 | Wt 152.6 lb

## 2017-05-25 DIAGNOSIS — G301 Alzheimer's disease with late onset: Secondary | ICD-10-CM

## 2017-05-25 DIAGNOSIS — F0280 Dementia in other diseases classified elsewhere without behavioral disturbance: Secondary | ICD-10-CM

## 2017-05-25 DIAGNOSIS — F39 Unspecified mood [affective] disorder: Secondary | ICD-10-CM | POA: Diagnosis not present

## 2017-05-25 DIAGNOSIS — M159 Polyosteoarthritis, unspecified: Secondary | ICD-10-CM | POA: Diagnosis not present

## 2017-05-25 DIAGNOSIS — I10 Essential (primary) hypertension: Secondary | ICD-10-CM

## 2017-05-25 NOTE — Assessment & Plan Note (Signed)
Variable complaints depending on the day (he doesn't remember) Hip, near shoulder, low back Doing well on bid tramadol---not complaining like he was

## 2017-05-25 NOTE — Assessment & Plan Note (Signed)
Still with anxiety, psychomotor restlessness, etc Recently mostly at night but better with regular nighttime lorazepam

## 2017-05-25 NOTE — Assessment & Plan Note (Signed)
Still no insight and limited judgement but doing okay in the mostly supervised environment

## 2017-05-25 NOTE — Assessment & Plan Note (Signed)
BP Readings from Last 3 Encounters:  05/25/17 135/86  04/07/17 118/79  12/15/16 136/80   BP good on the amlodipine

## 2017-05-25 NOTE — Progress Notes (Signed)
Subjective:    Patient ID: Robert Le, male    DOB: 09-Sep-1940, 77 y.o.   MRN: [id number]  HPI Visit in AL room for follow up of chronic health conditions Reviewed status with Luellen Pucker RN  Has tried to stay active Does activities and exercise here  Has had some increased arthritic pain Talks about upper right back---also low back Hip is better Doing better with the bid tramadol  Some trouble noted at night by staff Leaving room, wandering downstairs, etc Seems to be doing better with nightly lorazepam Has this for prn ---but I don't think he needs it much  No apparent functional decline He is totally unaware of his memory issues---but maintaining independence for ADLs  Mood generally okay No depression  Current Outpatient Medications on File Prior to Visit  Medication Sig Dispense Refill  . amLODipine (NORVASC) 5 MG tablet Take 1 tablet (5 mg total) by mouth every morning. 30 tablet 11  . cholecalciferol (VITAMIN D) 1000 units tablet Take 1 tablet (1,000 Units total) by mouth daily. 30 tablet 11  . LORazepam (ATIVAN) 0.5 MG tablet TAKE 1 TABLET BY MOUTH TWICE A DAY AS NEEDED FOR ANXIETY 30 tablet 0  . LORazepam (ATIVAN) 0.5 MG tablet Take 0.5 mg by mouth at bedtime.    . Multiple Vitamin (MULTIVITAMIN WITH MINERALS) TABS tablet Take 1 tablet by mouth daily. 30 tablet 11  . traMADol (ULTRAM) 50 MG tablet Take 50 mg by mouth 2 (two) times daily.     No current facility-administered medications on file prior to visit.     Allergies  Allergen Reactions  . Donepezil Other (See Comments)    Increased confusion  . Sulfites     Unable to recall reaction or specific sulfa drug    Past Medical History:  Diagnosis Date  . Anxiety   . Arthritis   . Chronic kidney disease    L TOTAL NEPHRECTOMY  . Hypertension     Past Surgical History:  Procedure Laterality Date  . [place]  . 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 Heart disease Father   . Arthritis Brother   . Arthritis Brother   . Cancer Neg Hx   . Diabetes Neg Hx     Social History   Socioeconomic History  . Marital status: Divorced    Spouse name: Not on file  . Number of children: 3  . Years of education: Not on file  . Highest education level: Not on file  Occupational History  . 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 Financial resource strain: Not on file  . Food insecurity:    Worry: Not on file    Inability: Not on file  . Transportation needs:    Medical: Not on file    Non-medical: Not on file  Tobacco Use  . Smoking status: Never Smoker  . Smokeless tobacco: Never Used  Substance and Sexual Activity  . Alcohol use: No  . Drug use: No  . Sexual activity: Not Currently  Lifestyle  . Physical activity:    Days per week: Not on file  Minutes per session: Not on file  . Stress: Not on file  Relationships  . Social connections:    Talks on phone: Not on file    Gets together: Not on file    Attends religious service: Not on file    Active member of club or organization: Not on file    Attends meetings of clubs or organizations: Not on file    Relationship status: Not on file  . Intimate partner violence:    Fear of current or ex partner: Not on file    Emotionally abused: Not on file    Physically abused: Not on file    Forced sexual activity: Not on file  Other Topics Concern  . Not on file  Social History Narrative   Not sure about advanced directives   Would want sons to make decisions   Would accept resuscitation attempts   Not sure about tube feeds   Review of Systems  Appetite is good Weight stable Bowels are fine No  chest pain or SOB No dizziness Voids okay--- good stream. Occasional nocturia only (though his memory is poor)     Objective:   Physical Exam  Constitutional: He appears well-developed. No distress.  Neck: No thyromegaly present.  Cardiovascular: Normal rate, regular rhythm and normal heart sounds. Exam reveals no gallop.  No murmur heard. Pulmonary/Chest: Effort normal and breath sounds normal. No respiratory distress. He has no wheezes. He has no rales.  Abdominal: Soft. There is no tenderness.  Musculoskeletal: He exhibits no edema or tenderness.  Lymphadenopathy:    He has no cervical adenopathy.  Psychiatric: He has a normal mood and affect. His behavior is normal.          Assessment & Plan:

## 2017-06-07 ENCOUNTER — Ambulatory Visit: Payer: Medicare Other | Admitting: Internal Medicine

## 2017-06-07 ENCOUNTER — Encounter: Payer: Self-pay | Admitting: Internal Medicine

## 2017-06-07 VITALS — BP 130/82 | HR 70 | Temp 97.8°F | Resp 18 | Wt 152.0 lb

## 2017-06-07 DIAGNOSIS — G4700 Insomnia, unspecified: Secondary | ICD-10-CM | POA: Diagnosis not present

## 2017-06-07 DIAGNOSIS — R1909 Other intra-abdominal and pelvic swelling, mass and lump: Secondary | ICD-10-CM

## 2017-06-07 NOTE — Patient Instructions (Signed)
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
## 2017-06-08 DIAGNOSIS — R222 Localized swelling, mass and lump, trunk: Secondary | ICD-10-CM | POA: Diagnosis not present

## 2017-08-09 ENCOUNTER — Ambulatory Visit: Payer: Medicare Other | Admitting: Internal Medicine

## 2017-08-09 ENCOUNTER — Encounter: Payer: Self-pay | Admitting: Internal Medicine

## 2017-08-09 VITALS — BP 147/85 | HR 80 | Temp 98.0°F | Resp 22 | Wt 149.8 lb

## 2017-08-09 DIAGNOSIS — R4689 Other symptoms and signs involving appearance and behavior: Secondary | ICD-10-CM | POA: Diagnosis not present

## 2017-08-09 DIAGNOSIS — G4709 Other insomnia: Secondary | ICD-10-CM

## 2017-08-09 DIAGNOSIS — F0390 Unspecified dementia without behavioral disturbance: Secondary | ICD-10-CM | POA: Diagnosis not present

## 2017-08-09 MED ORDER — MEMANTINE HCL 5 MG PO TABS: 5.0000 mg | ORAL_TABLET | Freq: Two times a day (BID) | ORAL | 2 refills | Status: DC

## 2017-08-09 MED ORDER — TRAZODONE HCL 50 MG PO TABS: 100.0000 mg | ORAL_TABLET | Freq: Every evening | ORAL | 2 refills | Status: DC | PRN

## 2017-08-09 NOTE — Patient Instructions (Signed)

## 2017-08-09 NOTE — Progress Notes (Signed)
Subjective:    Patient ID: Robert Le, male    DOB: Sep 01, 1940, 77 y.o.   MRN: [id number]  HPI  Asked to see resident in [address]. Persistent confusion, delusional and wandering late at night.  Talks about leaving ALF to go on trips, golfing, looking for daughter. Wandering into other residents rooms, getting mail out of their boxes Up and down all night, in and out of room, up and down the elevator. Getting Trazadone at bedtime, not very effective. He gets Ativan prn but this does not keep him asleep for very long  Review of Systems      Past Medical History:  Diagnosis Date  . Anxiety   . Arthritis   . Chronic kidney disease    L TOTAL NEPHRECTOMY  . Hypertension     Current Outpatient Medications  Medication Sig Dispense Refill  . amLODipine (NORVASC) 5 MG tablet Take 1 tablet (5 mg total) by mouth every morning. 30 tablet 11  . cholecalciferol (VITAMIN D) 1000 units tablet Take 1 tablet (1,000 Units total) by mouth daily. 30 tablet 11  . LORazepam (ATIVAN) 0.5 MG tablet TAKE 1 TABLET BY MOUTH TWICE A DAY AS NEEDED FOR ANXIETY 30 tablet 0  . LORazepam (ATIVAN) 0.5 MG tablet Take 0.5 mg by mouth at bedtime.    . Multiple Vitamin (MULTIVITAMIN WITH MINERALS) TABS tablet Take 1 tablet by mouth daily. 30 tablet 11  . traMADol (ULTRAM) 50 MG tablet Take 50 mg by mouth 2 (two) times daily.     No current facility-administered medications for this visit.     Allergies  Allergen Reactions  . Donepezil Other (See Comments)    Increased confusion  . Sulfites     Unable to recall reaction or specific sulfa drug    Family History  Problem Relation Age of Onset  . Heart disease Father   . Arthritis Brother   . Arthritis Brother   . Cancer Neg Hx   . Diabetes Neg Hx     Social History   Socioeconomic History  . Marital status: Divorced    Spouse name: Not on file  . Number of children: 3  . Years of education: Not on file  . Highest education level: Not on  file  Occupational History  . 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 Financial resource strain: Not on file  . Food insecurity:    Worry: Not on file    Inability: Not on file  . Transportation needs:    Medical: Not on file    Non-medical: Not on file  Tobacco Use  . Smoking status: Never Smoker  . Smokeless tobacco: Never Used  Substance and Sexual Activity  . Alcohol use: No  . Drug use: No  . Sexual activity: Not Currently  Lifestyle  . Physical activity:    Days per week: Not on file    Minutes per session: Not on file  . Stress: Not on file  Relationships  . Social connections:    Talks on phone: Not on file    Gets together: Not on file    Attends religious service: Not on file    Active member of club or organization: Not on file    Attends meetings of clubs or organizations: Not on file    Relationship status: Not on file  . Intimate partner violence:    Fear of current or ex partner: Not on file  Emotionally abused: Not on file    Physically abused: Not on file    Forced sexual activity: Not on file  Other Topics Concern  . Not on file  Social History Narrative   Not sure about advanced directives   Would want sons to make decisions   Would accept resuscitation attempts   Not sure about tube feeds     Constitutional: Denies fever, malaise, fatigue, headache or abrupt weight changes.  Respiratory: Denies difficulty breathing, shortness of breath, cough or sputum production.   Cardiovascular: Denies chest pain, chest tightness, difficulty with speech or problems with balance and coordination.  Psych: RN reports delusions, anxiety. Denies  depression, SI/HI.  No other specific complaints in a complete review of systems (except as listed in HPI above).  Objective:   Physical Exam   BP (!) 147/85   Pulse 80   Temp 98 F (36.7 C)   Resp (!) 22   Wt 149 lb 12.8 oz (67.9 kg)   BMI 23.12 kg/m  Wt Readings from Last 3  Encounters:  08/09/17 149 lb 12.8 oz (67.9 kg)  06/07/17 152 lb (68.9 kg)  05/25/17 152 lb 9.6 oz (69.2 kg)    General: Appears his stated age, in NAD. Neurological: Alert and oriented to person, place. Mildly confused.  Psychiatric: Mood and affect normal. Behavior is normal. Judgment and thought content normal.    BMET    Component Value Date/Time   NA 139 12/10/2015 1504   K 5.3 (H) 12/10/2015 1504   CL 104 12/10/2015 1504   CO2 32 12/10/2015 1504   GLUCOSE 104 (H) 12/10/2015 1504   BUN 24 (H) 12/10/2015 1504   CREATININE 1.15 12/10/2015 1504   CALCIUM 9.7 12/10/2015 1504   GFRNONAA 71 (L) 08/31/2013 1656   GFRAA 82 (L) 08/31/2013 1656    Lipid Panel  No results found for: CHOL, TRIG, HDL, CHOLHDL, VLDL, LDLCALC  CBC    Component Value Date/Time   WBC 6.4 12/10/2015 1504   RBC 5.10 12/10/2015 1504   HGB 15.8 12/10/2015 1504   HCT 46.8 12/10/2015 1504   PLT 226.0 12/10/2015 1504   MCV 91.8 12/10/2015 1504   MCH 31.4 08/31/2013 1656   MCHC 33.9 12/10/2015 1504   RDW 14.0 12/10/2015 1504   LYMPHSABS 1.4 12/10/2015 1504   MONOABS 0.6 12/10/2015 1504   EOSABS 0.1 12/10/2015 1504   BASOSABS 0.0 12/10/2015 1504    Hgb A1C No results found for: HGBA1C         Assessment & Plan:   Insomnia, Wandering:  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

## 2017-08-30 ENCOUNTER — Ambulatory Visit: Payer: Medicare Other | Admitting: Internal Medicine

## 2017-08-30 DIAGNOSIS — I10 Essential (primary) hypertension: Secondary | ICD-10-CM | POA: Diagnosis not present

## 2017-08-30 DIAGNOSIS — G301 Alzheimer's disease with late onset: Secondary | ICD-10-CM

## 2017-08-30 DIAGNOSIS — F0280 Dementia in other diseases classified elsewhere without behavioral disturbance: Secondary | ICD-10-CM | POA: Diagnosis not present

## 2017-08-30 DIAGNOSIS — M159 Polyosteoarthritis, unspecified: Secondary | ICD-10-CM | POA: Diagnosis not present

## 2017-08-30 DIAGNOSIS — G4700 Insomnia, unspecified: Secondary | ICD-10-CM

## 2017-09-13 NOTE — Progress Notes (Signed)
Subjective:    Patient ID: Robert Le, male    DOB: 03-Oct-1940, 77 y.o.   MRN: [id number]  HPI  Reviewed with RN Persistent wandering and confusion He reports he is not confused and sleeps well at night Independent with ADL's Appetite good, weight stable. No incontinence, bowels okay Denies pain, reflux or SOB at this time  HTN: His BP today is 144/81. He is taking Amlodipine as prescribed. ECG from 07/2013 reviewed.  OA: Controlled with Tylenol and Tramadol.  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 He is taking Namenda as prescribed.  Insomnia: He is up all hours of the night. Continue Trazadone and Ativan.  Review of Systems      Past Medical History:  Diagnosis Date  . Anxiety   . Arthritis   . Chronic kidney disease    L TOTAL NEPHRECTOMY  . Hypertension     Current Outpatient Medications  Medication Sig Dispense Refill  . amLODipine (NORVASC) 5 MG tablet Take 1 tablet (5 mg total) by mouth every morning. 30 tablet 11  . cholecalciferol (VITAMIN D) 1000 units tablet Take 1 tablet (1,000 Units total) by mouth daily. 30 tablet 11  . LORazepam (ATIVAN) 0.5 MG tablet TAKE 1 TABLET BY MOUTH TWICE A DAY AS NEEDED FOR ANXIETY 30 tablet 0  . LORazepam (ATIVAN) 0.5 MG tablet Take 0.5 mg by mouth at bedtime.    . memantine (NAMENDA) 5 MG tablet Take 1 tablet (5 mg total) by mouth 2 (two) times daily. 60 tablet 2  . Multiple Vitamin (MULTIVITAMIN WITH MINERALS) TABS tablet Take 1 tablet by mouth daily. 30 tablet 11  . traMADol (ULTRAM) 50 MG tablet Take 50 mg by mouth 2 (two) times daily.    . traZODone (DESYREL) 50 MG tablet Take 2 tablets (100 mg total) by mouth at bedtime as needed for sleep. 60 tablet 2   No current facility-administered medications for this visit.     Allergies  Allergen Reactions  . Donepezil Other (See Comments)    Increased confusion  . Sulfites     Unable to recall reaction or specific sulfa drug    Family History  Problem Relation Age  of Onset  . Heart disease Father   . Arthritis Brother   . Arthritis Brother   . Cancer Neg Hx   . Diabetes Neg Hx     Social History   Socioeconomic History  . Marital status: Divorced    Spouse name: Not on file  . Number of children: 3  . Years of education: Not on file  . Highest education level: Not on file  Occupational History  . 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 Financial resource strain: Not on file  . Food insecurity:    Worry: Not on file    Inability: Not on file  . Transportation needs:    Medical: Not on file    Non-medical: Not on file  Tobacco Use  . Smoking status: Never Smoker  . Smokeless tobacco: Never Used  Substance and Sexual Activity  . Alcohol use: No  . Drug use: No  . Sexual activity: Not Currently  Lifestyle  . Physical activity:    Days per week: Not on file    Minutes per session: Not on file  . Stress: Not on file  Relationships  . Social connections:    Talks on phone: Not on file    Gets together: Not on file  Attends religious service: Not on file    Active member of club or organization: Not on file    Attends meetings of clubs or organizations: Not on file    Relationship status: Not on file  . Intimate partner violence:    Fear of current or ex partner: Not on file    Emotionally abused: Not on file    Physically abused: Not on file    Forced sexual activity: Not on file  Other Topics Concern  . Not on file  Social History Narrative   Not sure about advanced directives   Would want sons to make decisions   Would accept resuscitation attempts   Not sure about tube feeds     Constitutional: Denies fever, malaise, fatigue, headache or abrupt weight changes.  HEENT: Denies eye pain, eye redness, ear pain, ringing in the ears, wax buildup, runny nose, nasal congestion, bloody nose, or sore throat. Respiratory: Denies difficulty breathing, shortness of breath, cough or sputum production.    Cardiovascular: Denies chest pain, chest tightness, palpitations or swelling in the hands or feet.  Gastrointestinal: Denies abdominal pain, bloating, constipation, diarrhea or blood in the stool.  GU: Denies urgency, frequency, pain with urination, burning sensation, blood in urine, odor or discharge. Musculoskeletal: Pt reports intermittent joint pain. Denies decrease in range of motion, difficulty with gait, muscle pain or joint swelling.  Skin: Denies redness, rashes, lesions or ulcercations.  Neurological: Pt reports difficulty with memory. RN reports confusion. Denies dizziness, difficulty with memory, or problems with balance and coordination.  Psych: Denies anxiety, depression, SI/HI.  No other specific complaints in a complete review of systems (except as listed in HPI above).  Objective:   Physical Exam        BP (!) 144/81   Pulse 82   Temp 98 F (36.7 C)   Resp (!) 22   Wt 150 lb 3.2 oz (68.1 kg)   BMI 23.18 kg/m  Wt Readings from Last 3 Encounters:  09/13/17 150 lb 3.2 oz (68.1 kg)  08/09/17 149 lb 12.8 oz (67.9 kg)  06/07/17 152 lb (68.9 kg)    General: Appears his stated age, well developed, well nourished in NAD. Skin: Warm, dry and intact. No lcerations noted. Cardiovascular: Normal rate and rhythm. S1,S2 noted.  No murmur, rubs or gallops noted.  Pulmonary/Chest: Normal effort and positive vesicular breath sounds. No respiratory distress. No wheezes, rales or ronchi noted.  Abdomen: Soft and nontender.  Musculoskeletal: Gait slow and steady without device. Neurological: Alert and oriented to person and place. Psychiatric: Mood and affect normal. Behavior is normal. Judgment and thought content normal.    BMET    Component Value Date/Time   NA 139 12/10/2015 1504   K 5.3 (H) 12/10/2015 1504   CL 104 12/10/2015 1504   CO2 32 12/10/2015 1504   GLUCOSE 104 (H) 12/10/2015 1504   BUN 24 (H) 12/10/2015 1504   CREATININE 1.15 12/10/2015 1504   CALCIUM  9.7 12/10/2015 1504   GFRNONAA 71 (L) 08/31/2013 1656   GFRAA 82 (L) 08/31/2013 1656    Lipid Panel  No results found for: CHOL, TRIG, HDL, CHOLHDL, VLDL, LDLCALC  CBC    Component Value Date/Time   WBC 6.4 12/10/2015 1504   RBC 5.10 12/10/2015 1504   HGB 15.8 12/10/2015 1504   HCT 46.8 12/10/2015 1504   PLT 226.0 12/10/2015 1504   MCV 91.8 12/10/2015 1504   MCH 31.4 08/31/2013 1656   MCHC 33.9 12/10/2015 1504  RDW 14.0 12/10/2015 1504   LYMPHSABS 1.4 12/10/2015 1504   MONOABS 0.6 12/10/2015 1504   EOSABS 0.1 12/10/2015 1504   BASOSABS 0.0 12/10/2015 1504    Hgb A1C No results found for: HGBA1C     Assessment & Plan:

## 2017-09-14 ENCOUNTER — Encounter: Payer: Self-pay | Admitting: Internal Medicine

## 2017-09-14 NOTE — Assessment & Plan Note (Signed)
Continue Trazadone and Ativan prn

## 2017-09-14 NOTE — Assessment & Plan Note (Signed)
Reasonable control on Amlodipine Will monitror

## 2017-09-14 NOTE — Assessment & Plan Note (Signed)
Controlled with Tylenol and Tramadol Will monitor

## 2017-09-14 NOTE — Assessment & Plan Note (Signed)
Continue Namenda and Ativan as prescribed.

## 2017-09-14 NOTE — Patient Instructions (Signed)

## 2017-11-17 DIAGNOSIS — N183 Chronic kidney disease, stage 3 (moderate): Secondary | ICD-10-CM | POA: Diagnosis not present

## 2017-11-17 DIAGNOSIS — I10 Essential (primary) hypertension: Secondary | ICD-10-CM | POA: Diagnosis not present

## 2017-11-17 DIAGNOSIS — F419 Anxiety disorder, unspecified: Secondary | ICD-10-CM

## 2017-11-17 DIAGNOSIS — G309 Alzheimer's disease, unspecified: Secondary | ICD-10-CM | POA: Diagnosis not present

## 2017-11-17 DIAGNOSIS — M1990 Unspecified osteoarthritis, unspecified site: Secondary | ICD-10-CM | POA: Diagnosis not present

## 2018-01-17 DIAGNOSIS — N183 Chronic kidney disease, stage 3 (moderate): Secondary | ICD-10-CM | POA: Diagnosis not present

## 2018-01-17 DIAGNOSIS — M1990 Unspecified osteoarthritis, unspecified site: Secondary | ICD-10-CM | POA: Diagnosis not present

## 2018-01-17 DIAGNOSIS — G309 Alzheimer's disease, unspecified: Secondary | ICD-10-CM | POA: Diagnosis not present

## 2018-01-17 DIAGNOSIS — F419 Anxiety disorder, unspecified: Secondary | ICD-10-CM | POA: Diagnosis not present

## 2018-01-17 DIAGNOSIS — I10 Essential (primary) hypertension: Secondary | ICD-10-CM | POA: Diagnosis not present

## 2018-03-14 DIAGNOSIS — G309 Alzheimer's disease, unspecified: Secondary | ICD-10-CM | POA: Diagnosis not present

## 2018-03-14 DIAGNOSIS — F419 Anxiety disorder, unspecified: Secondary | ICD-10-CM | POA: Diagnosis not present

## 2018-03-14 DIAGNOSIS — G479 Sleep disorder, unspecified: Secondary | ICD-10-CM | POA: Diagnosis not present

## 2018-03-14 DIAGNOSIS — N183 Chronic kidney disease, stage 3 (moderate): Secondary | ICD-10-CM | POA: Diagnosis not present

## 2018-03-14 DIAGNOSIS — M1990 Unspecified osteoarthritis, unspecified site: Secondary | ICD-10-CM | POA: Diagnosis not present

## 2018-03-14 DIAGNOSIS — I10 Essential (primary) hypertension: Secondary | ICD-10-CM | POA: Diagnosis not present

## 2018-03-15 ENCOUNTER — Encounter: Payer: Medicare Other | Admitting: Psychology

## 2018-05-04 DIAGNOSIS — G479 Sleep disorder, unspecified: Secondary | ICD-10-CM

## 2018-05-04 DIAGNOSIS — M1990 Unspecified osteoarthritis, unspecified site: Secondary | ICD-10-CM

## 2018-05-04 DIAGNOSIS — I10 Essential (primary) hypertension: Secondary | ICD-10-CM

## 2018-05-04 DIAGNOSIS — G309 Alzheimer's disease, unspecified: Secondary | ICD-10-CM

## 2018-05-04 DIAGNOSIS — F419 Anxiety disorder, unspecified: Secondary | ICD-10-CM

## 2018-05-04 DIAGNOSIS — N183 Chronic kidney disease, stage 3 (moderate): Secondary | ICD-10-CM

## 2018-07-13 DIAGNOSIS — N183 Chronic kidney disease, stage 3 (moderate): Secondary | ICD-10-CM | POA: Diagnosis not present

## 2018-07-13 DIAGNOSIS — G309 Alzheimer's disease, unspecified: Secondary | ICD-10-CM | POA: Diagnosis not present

## 2018-07-13 DIAGNOSIS — E43 Unspecified severe protein-calorie malnutrition: Secondary | ICD-10-CM | POA: Diagnosis not present

## 2018-07-13 DIAGNOSIS — I10 Essential (primary) hypertension: Secondary | ICD-10-CM | POA: Diagnosis not present

## 2018-07-13 DIAGNOSIS — M1990 Unspecified osteoarthritis, unspecified site: Secondary | ICD-10-CM | POA: Diagnosis not present

## 2018-07-13 DIAGNOSIS — F419 Anxiety disorder, unspecified: Secondary | ICD-10-CM | POA: Diagnosis not present

## 2018-07-23 DIAGNOSIS — M79645 Pain in left finger(s): Secondary | ICD-10-CM | POA: Diagnosis not present

## 2018-07-23 DIAGNOSIS — R2231 Localized swelling, mass and lump, right upper limb: Secondary | ICD-10-CM | POA: Diagnosis not present

## 2018-08-28 DIAGNOSIS — B342 Coronavirus infection, unspecified: Secondary | ICD-10-CM | POA: Diagnosis not present

## 2018-09-13 DIAGNOSIS — M25552 Pain in left hip: Secondary | ICD-10-CM | POA: Diagnosis not present

## 2018-09-13 DIAGNOSIS — R102 Pelvic and perineal pain: Secondary | ICD-10-CM | POA: Diagnosis not present

## 2018-09-14 DIAGNOSIS — G479 Sleep disorder, unspecified: Secondary | ICD-10-CM | POA: Diagnosis not present

## 2018-09-14 DIAGNOSIS — M1990 Unspecified osteoarthritis, unspecified site: Secondary | ICD-10-CM | POA: Diagnosis not present

## 2018-09-14 DIAGNOSIS — G309 Alzheimer's disease, unspecified: Secondary | ICD-10-CM | POA: Diagnosis not present

## 2018-09-14 DIAGNOSIS — N183 Chronic kidney disease, stage 3 (moderate): Secondary | ICD-10-CM | POA: Diagnosis not present

## 2018-09-14 DIAGNOSIS — F419 Anxiety disorder, unspecified: Secondary | ICD-10-CM | POA: Diagnosis not present

## 2018-09-14 DIAGNOSIS — I10 Essential (primary) hypertension: Secondary | ICD-10-CM | POA: Diagnosis not present

## 2018-10-18 DIAGNOSIS — M5416 Radiculopathy, lumbar region: Secondary | ICD-10-CM | POA: Diagnosis not present

## 2018-11-02 DIAGNOSIS — G479 Sleep disorder, unspecified: Secondary | ICD-10-CM

## 2018-11-02 DIAGNOSIS — I10 Essential (primary) hypertension: Secondary | ICD-10-CM | POA: Diagnosis not present

## 2018-11-02 DIAGNOSIS — M1990 Unspecified osteoarthritis, unspecified site: Secondary | ICD-10-CM | POA: Diagnosis not present

## 2018-11-02 DIAGNOSIS — G309 Alzheimer's disease, unspecified: Secondary | ICD-10-CM | POA: Diagnosis not present

## 2018-11-02 DIAGNOSIS — N183 Chronic kidney disease, stage 3 unspecified: Secondary | ICD-10-CM | POA: Diagnosis not present

## 2018-11-02 DIAGNOSIS — F419 Anxiety disorder, unspecified: Secondary | ICD-10-CM

## 2018-12-06 DIAGNOSIS — E119 Type 2 diabetes mellitus without complications: Secondary | ICD-10-CM | POA: Diagnosis not present

## 2018-12-29 DIAGNOSIS — M1990 Unspecified osteoarthritis, unspecified site: Secondary | ICD-10-CM | POA: Insufficient documentation

## 2018-12-29 DIAGNOSIS — N189 Chronic kidney disease, unspecified: Secondary | ICD-10-CM | POA: Insufficient documentation

## 2018-12-29 DIAGNOSIS — I10 Essential (primary) hypertension: Secondary | ICD-10-CM | POA: Insufficient documentation

## 2018-12-29 DIAGNOSIS — F419 Anxiety disorder, unspecified: Secondary | ICD-10-CM | POA: Insufficient documentation

## 2019-01-07 DIAGNOSIS — K0889 Other specified disorders of teeth and supporting structures: Secondary | ICD-10-CM | POA: Diagnosis not present

## 2019-01-11 DIAGNOSIS — G309 Alzheimer's disease, unspecified: Secondary | ICD-10-CM | POA: Diagnosis not present

## 2019-01-11 DIAGNOSIS — G479 Sleep disorder, unspecified: Secondary | ICD-10-CM

## 2019-01-11 DIAGNOSIS — N183 Chronic kidney disease, stage 3 unspecified: Secondary | ICD-10-CM

## 2019-01-11 DIAGNOSIS — I10 Essential (primary) hypertension: Secondary | ICD-10-CM | POA: Diagnosis not present

## 2019-01-11 DIAGNOSIS — F323 Major depressive disorder, single episode, severe with psychotic features: Secondary | ICD-10-CM

## 2019-01-11 DIAGNOSIS — A09 Infectious gastroenteritis and colitis, unspecified: Secondary | ICD-10-CM

## 2019-01-11 DIAGNOSIS — M1990 Unspecified osteoarthritis, unspecified site: Secondary | ICD-10-CM

## 2019-01-11 DIAGNOSIS — K047 Periapical abscess without sinus: Secondary | ICD-10-CM

## 2019-02-08 DIAGNOSIS — R1030 Lower abdominal pain, unspecified: Secondary | ICD-10-CM | POA: Diagnosis not present

## 2019-03-06 DIAGNOSIS — I10 Essential (primary) hypertension: Secondary | ICD-10-CM | POA: Diagnosis not present

## 2019-03-06 DIAGNOSIS — G479 Sleep disorder, unspecified: Secondary | ICD-10-CM

## 2019-03-06 DIAGNOSIS — M1990 Unspecified osteoarthritis, unspecified site: Secondary | ICD-10-CM | POA: Diagnosis not present

## 2019-03-06 DIAGNOSIS — N183 Chronic kidney disease, stage 3 unspecified: Secondary | ICD-10-CM | POA: Diagnosis not present

## 2019-03-06 DIAGNOSIS — G309 Alzheimer's disease, unspecified: Secondary | ICD-10-CM | POA: Diagnosis not present

## 2019-03-12 DIAGNOSIS — R011 Cardiac murmur, unspecified: Secondary | ICD-10-CM | POA: Diagnosis not present

## 2019-04-18 DIAGNOSIS — F39 Unspecified mood [affective] disorder: Secondary | ICD-10-CM | POA: Diagnosis not present

## 2019-05-24 DIAGNOSIS — I10 Essential (primary) hypertension: Secondary | ICD-10-CM

## 2019-05-24 DIAGNOSIS — M5416 Radiculopathy, lumbar region: Secondary | ICD-10-CM

## 2019-05-24 DIAGNOSIS — M1990 Unspecified osteoarthritis, unspecified site: Secondary | ICD-10-CM

## 2019-05-24 DIAGNOSIS — F419 Anxiety disorder, unspecified: Secondary | ICD-10-CM

## 2019-05-24 DIAGNOSIS — N183 Chronic kidney disease, stage 3 unspecified: Secondary | ICD-10-CM | POA: Diagnosis not present

## 2019-05-24 DIAGNOSIS — G309 Alzheimer's disease, unspecified: Secondary | ICD-10-CM

## 2019-05-24 DIAGNOSIS — G479 Sleep disorder, unspecified: Secondary | ICD-10-CM

## 2019-07-17 DIAGNOSIS — M545 Low back pain: Secondary | ICD-10-CM

## 2019-07-17 DIAGNOSIS — F323 Major depressive disorder, single episode, severe with psychotic features: Secondary | ICD-10-CM

## 2019-07-17 DIAGNOSIS — G309 Alzheimer's disease, unspecified: Secondary | ICD-10-CM

## 2019-07-17 DIAGNOSIS — F39 Unspecified mood [affective] disorder: Secondary | ICD-10-CM

## 2019-07-17 DIAGNOSIS — G479 Sleep disorder, unspecified: Secondary | ICD-10-CM

## 2019-07-17 DIAGNOSIS — I10 Essential (primary) hypertension: Secondary | ICD-10-CM

## 2019-07-17 DIAGNOSIS — N183 Chronic kidney disease, stage 3 unspecified: Secondary | ICD-10-CM

## 2019-07-17 DIAGNOSIS — M1990 Unspecified osteoarthritis, unspecified site: Secondary | ICD-10-CM

## 2019-08-28 DIAGNOSIS — R102 Pelvic and perineal pain: Secondary | ICD-10-CM | POA: Diagnosis not present

## 2019-08-28 DIAGNOSIS — R609 Edema, unspecified: Secondary | ICD-10-CM | POA: Diagnosis not present

## 2019-08-28 DIAGNOSIS — M25552 Pain in left hip: Secondary | ICD-10-CM

## 2019-08-29 DIAGNOSIS — M7662 Achilles tendinitis, left leg: Secondary | ICD-10-CM

## 2019-09-04 DIAGNOSIS — I872 Venous insufficiency (chronic) (peripheral): Secondary | ICD-10-CM | POA: Diagnosis not present

## 2019-09-12 DIAGNOSIS — M5416 Radiculopathy, lumbar region: Secondary | ICD-10-CM

## 2019-09-12 DIAGNOSIS — M159 Polyosteoarthritis, unspecified: Secondary | ICD-10-CM | POA: Diagnosis not present

## 2019-09-12 DIAGNOSIS — G301 Alzheimer's disease with late onset: Secondary | ICD-10-CM | POA: Diagnosis not present

## 2019-09-12 DIAGNOSIS — N1831 Chronic kidney disease, stage 3a: Secondary | ICD-10-CM

## 2019-09-12 DIAGNOSIS — F39 Unspecified mood [affective] disorder: Secondary | ICD-10-CM

## 2019-11-06 DIAGNOSIS — M1990 Unspecified osteoarthritis, unspecified site: Secondary | ICD-10-CM

## 2019-11-06 DIAGNOSIS — M5416 Radiculopathy, lumbar region: Secondary | ICD-10-CM

## 2019-11-06 DIAGNOSIS — G479 Sleep disorder, unspecified: Secondary | ICD-10-CM

## 2019-11-06 DIAGNOSIS — F419 Anxiety disorder, unspecified: Secondary | ICD-10-CM

## 2019-11-06 DIAGNOSIS — I872 Venous insufficiency (chronic) (peripheral): Secondary | ICD-10-CM

## 2019-11-06 DIAGNOSIS — F39 Unspecified mood [affective] disorder: Secondary | ICD-10-CM

## 2019-11-06 DIAGNOSIS — G309 Alzheimer's disease, unspecified: Secondary | ICD-10-CM | POA: Diagnosis not present

## 2019-11-06 DIAGNOSIS — N183 Chronic kidney disease, stage 3 unspecified: Secondary | ICD-10-CM

## 2020-01-15 DIAGNOSIS — M159 Polyosteoarthritis, unspecified: Secondary | ICD-10-CM

## 2020-01-15 DIAGNOSIS — N1831 Chronic kidney disease, stage 3a: Secondary | ICD-10-CM

## 2020-01-15 DIAGNOSIS — M5416 Radiculopathy, lumbar region: Secondary | ICD-10-CM

## 2020-01-15 DIAGNOSIS — G301 Alzheimer's disease with late onset: Secondary | ICD-10-CM

## 2020-01-15 DIAGNOSIS — F39 Unspecified mood [affective] disorder: Secondary | ICD-10-CM | POA: Diagnosis not present

## 2020-02-12 DIAGNOSIS — M25559 Pain in unspecified hip: Secondary | ICD-10-CM | POA: Diagnosis not present

## 2020-03-04 DIAGNOSIS — I10 Essential (primary) hypertension: Secondary | ICD-10-CM | POA: Diagnosis not present

## 2020-03-04 DIAGNOSIS — N183 Chronic kidney disease, stage 3 unspecified: Secondary | ICD-10-CM | POA: Diagnosis not present

## 2020-03-04 DIAGNOSIS — G479 Sleep disorder, unspecified: Secondary | ICD-10-CM

## 2020-03-04 DIAGNOSIS — F39 Unspecified mood [affective] disorder: Secondary | ICD-10-CM

## 2020-03-04 DIAGNOSIS — M1990 Unspecified osteoarthritis, unspecified site: Secondary | ICD-10-CM | POA: Diagnosis not present

## 2020-03-04 DIAGNOSIS — G309 Alzheimer's disease, unspecified: Secondary | ICD-10-CM | POA: Diagnosis not present

## 2020-03-04 DIAGNOSIS — F411 Generalized anxiety disorder: Secondary | ICD-10-CM

## 2020-03-04 DIAGNOSIS — M545 Low back pain, unspecified: Secondary | ICD-10-CM

## 2020-05-11 DIAGNOSIS — G301 Alzheimer's disease with late onset: Secondary | ICD-10-CM | POA: Diagnosis not present

## 2020-05-11 DIAGNOSIS — N1831 Chronic kidney disease, stage 3a: Secondary | ICD-10-CM

## 2020-05-11 DIAGNOSIS — M5416 Radiculopathy, lumbar region: Secondary | ICD-10-CM

## 2020-05-11 DIAGNOSIS — M159 Polyosteoarthritis, unspecified: Secondary | ICD-10-CM

## 2020-05-11 DIAGNOSIS — F39 Unspecified mood [affective] disorder: Secondary | ICD-10-CM

## 2020-07-17 DIAGNOSIS — F323 Major depressive disorder, single episode, severe with psychotic features: Secondary | ICD-10-CM

## 2020-07-17 DIAGNOSIS — M1990 Unspecified osteoarthritis, unspecified site: Secondary | ICD-10-CM

## 2020-07-17 DIAGNOSIS — N183 Chronic kidney disease, stage 3 unspecified: Secondary | ICD-10-CM

## 2020-07-17 DIAGNOSIS — G309 Alzheimer's disease, unspecified: Secondary | ICD-10-CM

## 2020-07-17 DIAGNOSIS — G479 Sleep disorder, unspecified: Secondary | ICD-10-CM

## 2020-07-17 DIAGNOSIS — I499 Cardiac arrhythmia, unspecified: Secondary | ICD-10-CM

## 2020-07-17 DIAGNOSIS — M5416 Radiculopathy, lumbar region: Secondary | ICD-10-CM

## 2020-07-17 DIAGNOSIS — I10 Essential (primary) hypertension: Secondary | ICD-10-CM

## 2020-07-17 DIAGNOSIS — F39 Unspecified mood [affective] disorder: Secondary | ICD-10-CM

## 2020-08-12 DIAGNOSIS — F0391 Unspecified dementia with behavioral disturbance: Secondary | ICD-10-CM

## 2020-09-10 DIAGNOSIS — M159 Polyosteoarthritis, unspecified: Secondary | ICD-10-CM | POA: Diagnosis not present

## 2020-09-10 DIAGNOSIS — G301 Alzheimer's disease with late onset: Secondary | ICD-10-CM | POA: Diagnosis not present

## 2020-09-10 DIAGNOSIS — F39 Unspecified mood [affective] disorder: Secondary | ICD-10-CM | POA: Diagnosis not present

## 2020-09-10 DIAGNOSIS — N1831 Chronic kidney disease, stage 3a: Secondary | ICD-10-CM | POA: Diagnosis not present

## 2020-09-10 DIAGNOSIS — M5416 Radiculopathy, lumbar region: Secondary | ICD-10-CM

## 2020-09-14 DIAGNOSIS — R451 Restlessness and agitation: Secondary | ICD-10-CM | POA: Diagnosis not present

## 2020-11-11 DIAGNOSIS — M1990 Unspecified osteoarthritis, unspecified site: Secondary | ICD-10-CM | POA: Diagnosis not present

## 2020-11-11 DIAGNOSIS — G309 Alzheimer's disease, unspecified: Secondary | ICD-10-CM | POA: Diagnosis not present

## 2020-11-11 DIAGNOSIS — F39 Unspecified mood [affective] disorder: Secondary | ICD-10-CM | POA: Diagnosis not present

## 2020-11-11 DIAGNOSIS — G479 Sleep disorder, unspecified: Secondary | ICD-10-CM

## 2020-11-11 DIAGNOSIS — M5416 Radiculopathy, lumbar region: Secondary | ICD-10-CM

## 2020-11-11 DIAGNOSIS — N183 Chronic kidney disease, stage 3 unspecified: Secondary | ICD-10-CM | POA: Diagnosis not present

## 2021-01-07 DIAGNOSIS — G301 Alzheimer's disease with late onset: Secondary | ICD-10-CM

## 2021-01-07 DIAGNOSIS — M5416 Radiculopathy, lumbar region: Secondary | ICD-10-CM

## 2021-01-07 DIAGNOSIS — M159 Polyosteoarthritis, unspecified: Secondary | ICD-10-CM

## 2021-01-07 DIAGNOSIS — F39 Unspecified mood [affective] disorder: Secondary | ICD-10-CM

## 2021-01-29 DIAGNOSIS — F39 Unspecified mood [affective] disorder: Secondary | ICD-10-CM

## 2021-01-29 DIAGNOSIS — F03911 Unspecified dementia, unspecified severity, with agitation: Secondary | ICD-10-CM | POA: Diagnosis not present

## 2021-03-17 DIAGNOSIS — F39 Unspecified mood [affective] disorder: Secondary | ICD-10-CM

## 2021-03-17 DIAGNOSIS — N183 Chronic kidney disease, stage 3 unspecified: Secondary | ICD-10-CM

## 2021-03-17 DIAGNOSIS — M5416 Radiculopathy, lumbar region: Secondary | ICD-10-CM

## 2021-03-17 DIAGNOSIS — G479 Sleep disorder, unspecified: Secondary | ICD-10-CM

## 2021-03-17 DIAGNOSIS — G309 Alzheimer's disease, unspecified: Secondary | ICD-10-CM

## 2021-03-17 DIAGNOSIS — M1990 Unspecified osteoarthritis, unspecified site: Secondary | ICD-10-CM

## 2021-03-17 DIAGNOSIS — I10 Essential (primary) hypertension: Secondary | ICD-10-CM

## 2021-03-24 DIAGNOSIS — M5416 Radiculopathy, lumbar region: Secondary | ICD-10-CM

## 2021-03-24 DIAGNOSIS — R2689 Other abnormalities of gait and mobility: Secondary | ICD-10-CM

## 2021-03-25 DIAGNOSIS — M4726 Other spondylosis with radiculopathy, lumbar region: Secondary | ICD-10-CM

## 2021-03-26 ENCOUNTER — Encounter: Payer: Medicare Other | Admitting: Internal Medicine

## 2021-03-30 DIAGNOSIS — R2689 Other abnormalities of gait and mobility: Secondary | ICD-10-CM | POA: Insufficient documentation

## 2021-03-30 DIAGNOSIS — R2681 Unsteadiness on feet: Secondary | ICD-10-CM | POA: Insufficient documentation

## 2021-03-30 DIAGNOSIS — R278 Other lack of coordination: Secondary | ICD-10-CM | POA: Insufficient documentation

## 2021-04-16 ENCOUNTER — Emergency Department: Payer: Medicare Other

## 2021-04-16 ENCOUNTER — Inpatient Hospital Stay
Admission: EM | Admit: 2021-04-16 | Discharge: 2021-04-20 | DRG: 871 | Disposition: A | Discharge status: Skilled Nursing Facility | Payer: Medicare Other | Source: Skilled Nursing Facility | Attending: Hospitalist | Admitting: Hospitalist

## 2021-04-16 DIAGNOSIS — R54 Age-related physical debility: Secondary | ICD-10-CM | POA: Diagnosis present

## 2021-04-16 DIAGNOSIS — K810 Acute cholecystitis: Secondary | ICD-10-CM | POA: Diagnosis present

## 2021-04-16 DIAGNOSIS — N390 Urinary tract infection, site not specified: Secondary | ICD-10-CM | POA: Diagnosis present

## 2021-04-16 DIAGNOSIS — F419 Anxiety disorder, unspecified: Secondary | ICD-10-CM | POA: Diagnosis present

## 2021-04-16 DIAGNOSIS — I2721 Secondary pulmonary arterial hypertension: Secondary | ICD-10-CM | POA: Diagnosis present

## 2021-04-16 DIAGNOSIS — F03C0 Unspecified dementia, severe, without behavioral disturbance, psychotic disturbance, mood disturbance, and anxiety: Secondary | ICD-10-CM | POA: Diagnosis present

## 2021-04-16 DIAGNOSIS — R000 Tachycardia, unspecified: Secondary | ICD-10-CM | POA: Diagnosis not present

## 2021-04-16 DIAGNOSIS — Z8679 Personal history of other diseases of the circulatory system: Secondary | ICD-10-CM

## 2021-04-16 DIAGNOSIS — R4182 Altered mental status, unspecified: Secondary | ICD-10-CM | POA: Diagnosis not present

## 2021-04-16 DIAGNOSIS — R253 Fasciculation: Secondary | ICD-10-CM | POA: Diagnosis not present

## 2021-04-16 DIAGNOSIS — Z888 Allergy status to other drugs, medicaments and biological substances status: Secondary | ICD-10-CM

## 2021-04-16 DIAGNOSIS — I82412 Acute embolism and thrombosis of left femoral vein: Secondary | ICD-10-CM | POA: Diagnosis not present

## 2021-04-16 DIAGNOSIS — G9341 Metabolic encephalopathy: Secondary | ICD-10-CM

## 2021-04-16 DIAGNOSIS — I10 Essential (primary) hypertension: Secondary | ICD-10-CM | POA: Diagnosis present

## 2021-04-16 DIAGNOSIS — A419 Sepsis, unspecified organism: Secondary | ICD-10-CM | POA: Diagnosis not present

## 2021-04-16 DIAGNOSIS — Z905 Acquired absence of kidney: Secondary | ICD-10-CM

## 2021-04-16 DIAGNOSIS — M1990 Unspecified osteoarthritis, unspecified site: Secondary | ICD-10-CM | POA: Diagnosis present

## 2021-04-16 DIAGNOSIS — Z20822 Contact with and (suspected) exposure to covid-19: Secondary | ICD-10-CM | POA: Diagnosis present

## 2021-04-16 DIAGNOSIS — G8192 Hemiplegia, unspecified affecting left dominant side: Secondary | ICD-10-CM | POA: Diagnosis not present

## 2021-04-16 DIAGNOSIS — J90 Pleural effusion, not elsewhere classified: Secondary | ICD-10-CM | POA: Diagnosis present

## 2021-04-16 DIAGNOSIS — Z882 Allergy status to sulfonamides status: Secondary | ICD-10-CM

## 2021-04-16 DIAGNOSIS — Z79891 Long term (current) use of opiate analgesic: Secondary | ICD-10-CM

## 2021-04-16 DIAGNOSIS — G309 Alzheimer's disease, unspecified: Secondary | ICD-10-CM | POA: Diagnosis present

## 2021-04-16 DIAGNOSIS — Z96642 Presence of left artificial hip joint: Secondary | ICD-10-CM | POA: Diagnosis present

## 2021-04-16 DIAGNOSIS — F0280 Dementia in other diseases classified elsewhere without behavioral disturbance: Secondary | ICD-10-CM | POA: Diagnosis present

## 2021-04-16 DIAGNOSIS — R778 Other specified abnormalities of plasma proteins: Secondary | ICD-10-CM | POA: Diagnosis present

## 2021-04-16 DIAGNOSIS — Z79899 Other long term (current) drug therapy: Secondary | ICD-10-CM

## 2021-04-16 LAB — CBC WITH DIFFERENTIAL/PLATELET
Abs Immature Granulocytes: 0.17 10*3/uL — ABNORMAL HIGH (ref 0.00–0.07)
Basophils Absolute: 0 10*3/uL (ref 0.0–0.1)
Basophils Relative: 0 %
Eosinophils Absolute: 0 10*3/uL (ref 0.0–0.5)
Eosinophils Relative: 0 %
HCT: 46.9 % (ref 39.0–52.0)
Hemoglobin: 15.5 g/dL (ref 13.0–17.0)
Immature Granulocytes: 1 %
Lymphocytes Relative: 5 %
Lymphs Abs: 1 10*3/uL (ref 0.7–4.0)
MCH: 29.9 pg (ref 26.0–34.0)
MCHC: 33 g/dL (ref 30.0–36.0)
MCV: 90.5 fL (ref 80.0–100.0)
Monocytes Absolute: 1.8 10*3/uL — ABNORMAL HIGH (ref 0.1–1.0)
Monocytes Relative: 9 %
Neutro Abs: 17.7 10*3/uL — ABNORMAL HIGH (ref 1.7–7.7)
Neutrophils Relative %: 85 %
Platelets: 232 10*3/uL (ref 150–400)
RBC: 5.18 MIL/uL (ref 4.22–5.81)
RDW: 13.9 % (ref 11.5–15.5)
WBC: 20.6 10*3/uL — ABNORMAL HIGH (ref 4.0–10.5)
nRBC: 0 % (ref 0.0–0.2)

## 2021-04-16 LAB — COMPREHENSIVE METABOLIC PANEL
ALT: 44 U/L (ref 0–44)
AST: 27 U/L (ref 15–41)
Albumin: 3 g/dL — ABNORMAL LOW (ref 3.5–5.0)
Alkaline Phosphatase: 70 U/L (ref 38–126)
Anion gap: 11 (ref 5–15)
BUN: 18 mg/dL (ref 8–23)
CO2: 23 mmol/L (ref 22–32)
Calcium: 8.9 mg/dL (ref 8.9–10.3)
Chloride: 106 mmol/L (ref 98–111)
Creatinine, Ser: 1.15 mg/dL (ref 0.61–1.24)
GFR, Estimated: >60 mL/min (ref >60)
Glucose, Bld: 132 mg/dL — ABNORMAL HIGH (ref 70–99)
Potassium: 4.1 mmol/L (ref 3.5–5.1)
Sodium: 140 mmol/L (ref 135–145)
Total Bilirubin: 0.9 mg/dL (ref 0.3–1.2)
Total Protein: 6.3 g/dL — ABNORMAL LOW (ref 6.5–8.1)

## 2021-04-16 LAB — TROPONIN I (HIGH SENSITIVITY): Troponin I (High Sensitivity): 27 ng/L — ABNORMAL HIGH (ref <18)

## 2021-04-16 LAB — URINALYSIS, ROUTINE W REFLEX MICROSCOPIC
Bacteria, UA: NONE SEEN
Bilirubin Urine: NEGATIVE
Glucose, UA: NEGATIVE mg/dL
Hgb urine dipstick: NEGATIVE
Ketones, ur: 5 mg/dL — AB
Nitrite: NEGATIVE
Protein, ur: 100 mg/dL — AB
Specific Gravity, Urine: 1.029 (ref 1.005–1.030)
Squamous Epithelial / HPF: NONE SEEN (ref 0–5)
pH: 5 (ref 5.0–8.0)

## 2021-04-16 LAB — LACTIC ACID, PLASMA: Lactic Acid, Venous: 1 mmol/L (ref 0.5–1.9)

## 2021-04-16 LAB — RESP PANEL BY RT-PCR (FLU A&B, COVID) ARPGX2
Influenza A by PCR: NEGATIVE
Influenza B by PCR: NEGATIVE
SARS Coronavirus 2 by RT PCR: NEGATIVE

## 2021-04-16 MED ORDER — ACETAMINOPHEN 325 MG PO TABS: 650.0000 mg | ORAL_TABLET | Freq: Once | ORAL | Status: DC

## 2021-04-16 MED ORDER — ACETAMINOPHEN 325 MG RE SUPP
650.0000 mg | Freq: Once | RECTAL | Status: AC
  Administered 2021-04-16: 650 mg via RECTAL
  Filled 2021-04-16: qty 2

## 2021-04-16 NOTE — ED Triage Notes (Signed)
81 y/o male arrived to the [facility] by EMS coming from [facility] memory care with a CC of a suspected UTI and alerted mental status. Pt had lab work done and elevated WBC was found. Staff and EMS states pt has a malodorous urine. Pt has a history of Dementia but is more confused than normal. And not ambulating as he usually does. [id number] ?

## 2021-04-17 ENCOUNTER — Other Ambulatory Visit: Payer: Self-pay

## 2021-04-17 ENCOUNTER — Emergency Department: Payer: Medicare Other

## 2021-04-17 ENCOUNTER — Encounter: Payer: Self-pay | Admitting: Radiology

## 2021-04-17 ENCOUNTER — Other Ambulatory Visit: Payer: Self-pay | Admitting: Internal Medicine

## 2021-04-17 ENCOUNTER — Inpatient Hospital Stay: Payer: Medicare Other

## 2021-04-17 DIAGNOSIS — I2721 Secondary pulmonary arterial hypertension: Secondary | ICD-10-CM | POA: Diagnosis present

## 2021-04-17 DIAGNOSIS — M1990 Unspecified osteoarthritis, unspecified site: Secondary | ICD-10-CM | POA: Diagnosis present

## 2021-04-17 DIAGNOSIS — I82412 Acute embolism and thrombosis of left femoral vein: Secondary | ICD-10-CM | POA: Diagnosis not present

## 2021-04-17 DIAGNOSIS — Z905 Acquired absence of kidney: Secondary | ICD-10-CM | POA: Diagnosis not present

## 2021-04-17 DIAGNOSIS — A419 Sepsis, unspecified organism: Secondary | ICD-10-CM | POA: Diagnosis present

## 2021-04-17 DIAGNOSIS — G9341 Metabolic encephalopathy: Secondary | ICD-10-CM

## 2021-04-17 DIAGNOSIS — R4182 Altered mental status, unspecified: Secondary | ICD-10-CM | POA: Diagnosis not present

## 2021-04-17 DIAGNOSIS — K810 Acute cholecystitis: Secondary | ICD-10-CM | POA: Diagnosis present

## 2021-04-17 DIAGNOSIS — F419 Anxiety disorder, unspecified: Secondary | ICD-10-CM | POA: Diagnosis present

## 2021-04-17 DIAGNOSIS — Z79891 Long term (current) use of opiate analgesic: Secondary | ICD-10-CM | POA: Diagnosis not present

## 2021-04-17 DIAGNOSIS — N390 Urinary tract infection, site not specified: Secondary | ICD-10-CM | POA: Diagnosis present

## 2021-04-17 DIAGNOSIS — Z888 Allergy status to other drugs, medicaments and biological substances status: Secondary | ICD-10-CM | POA: Diagnosis not present

## 2021-04-17 DIAGNOSIS — Z79899 Other long term (current) drug therapy: Secondary | ICD-10-CM | POA: Diagnosis not present

## 2021-04-17 DIAGNOSIS — R54 Age-related physical debility: Secondary | ICD-10-CM | POA: Diagnosis present

## 2021-04-17 DIAGNOSIS — Z96642 Presence of left artificial hip joint: Secondary | ICD-10-CM | POA: Diagnosis present

## 2021-04-17 DIAGNOSIS — Z882 Allergy status to sulfonamides status: Secondary | ICD-10-CM | POA: Diagnosis not present

## 2021-04-17 DIAGNOSIS — J90 Pleural effusion, not elsewhere classified: Secondary | ICD-10-CM | POA: Diagnosis present

## 2021-04-17 DIAGNOSIS — G309 Alzheimer's disease, unspecified: Secondary | ICD-10-CM | POA: Diagnosis present

## 2021-04-17 DIAGNOSIS — F0280 Dementia in other diseases classified elsewhere without behavioral disturbance: Secondary | ICD-10-CM | POA: Diagnosis present

## 2021-04-17 DIAGNOSIS — Z20822 Contact with and (suspected) exposure to covid-19: Secondary | ICD-10-CM | POA: Diagnosis present

## 2021-04-17 DIAGNOSIS — R778 Other specified abnormalities of plasma proteins: Secondary | ICD-10-CM | POA: Diagnosis present

## 2021-04-17 HISTORY — PX: IR PERC CHOLECYSTOSTOMY: IMG2326

## 2021-04-17 LAB — COMPREHENSIVE METABOLIC PANEL
ALT: 35 U/L (ref 0–44)
AST: 30 U/L (ref 15–41)
Albumin: 2.7 g/dL — ABNORMAL LOW (ref 3.5–5.0)
Alkaline Phosphatase: 61 U/L (ref 38–126)
Anion gap: 9 (ref 5–15)
BUN: 16 mg/dL (ref 8–23)
CO2: 25 mmol/L (ref 22–32)
Calcium: 8.5 mg/dL — ABNORMAL LOW (ref 8.9–10.3)
Chloride: 104 mmol/L (ref 98–111)
Creatinine, Ser: 1.13 mg/dL (ref 0.61–1.24)
GFR, Estimated: >60 mL/min (ref >60)
Glucose, Bld: 111 mg/dL — ABNORMAL HIGH (ref 70–99)
Potassium: 4.6 mmol/L (ref 3.5–5.1)
Sodium: 138 mmol/L (ref 135–145)
Total Bilirubin: 1.2 mg/dL (ref 0.3–1.2)
Total Protein: 5.5 g/dL — ABNORMAL LOW (ref 6.5–8.1)

## 2021-04-17 LAB — CBC
HCT: 54.2 % — ABNORMAL HIGH (ref 39.0–52.0)
Hemoglobin: 17.5 g/dL — ABNORMAL HIGH (ref 13.0–17.0)
MCH: 30 pg (ref 26.0–34.0)
MCHC: 32.3 g/dL (ref 30.0–36.0)
MCV: 92.8 fL (ref 80.0–100.0)
Platelets: 138 10*3/uL — ABNORMAL LOW (ref 150–400)
RBC: 5.84 MIL/uL — ABNORMAL HIGH (ref 4.22–5.81)
RDW: 14.2 % (ref 11.5–15.5)
WBC: 14.8 10*3/uL — ABNORMAL HIGH (ref 4.0–10.5)
nRBC: 0 % (ref 0.0–0.2)

## 2021-04-17 LAB — MRSA NEXT GEN BY PCR, NASAL: MRSA by PCR Next Gen: NOT DETECTED

## 2021-04-17 LAB — LACTIC ACID, PLASMA: Lactic Acid, Venous: 1.8 mmol/L (ref 0.5–1.9)

## 2021-04-17 LAB — CBG MONITORING, ED: Glucose-Capillary: 114 mg/dL — ABNORMAL HIGH (ref 70–99)

## 2021-04-17 LAB — TROPONIN I (HIGH SENSITIVITY): Troponin I (High Sensitivity): 27 ng/L — ABNORMAL HIGH (ref <18)

## 2021-04-17 MED ORDER — VANCOMYCIN HCL IN DEXTROSE 1-5 GM/200ML-% IV SOLN
1000.0000 mg | Freq: Once | INTRAVENOUS | Status: DC
  Filled 2021-04-17: qty 200

## 2021-04-17 MED ORDER — ACETAMINOPHEN 500 MG PO TABS
1000.0000 mg | ORAL_TABLET | Freq: Three times a day (TID) | ORAL | Status: DC | PRN
Start: 2021-04-17 — End: 2021-04-20
  Administered 2021-04-17: 1000 mg via ORAL
  Filled 2021-04-17: qty 2

## 2021-04-17 MED ORDER — MIDAZOLAM HCL 5 MG/5ML IJ SOLN
INTRAMUSCULAR | Status: AC
Start: 2021-04-17 — End: 2021-04-18
  Filled 2021-04-17: qty 5

## 2021-04-17 MED ORDER — SODIUM CHLORIDE 0.9 % IV SOLN
2.0000 g | Freq: Once | INTRAVENOUS | Status: AC
  Administered 2021-04-17: 2 g via INTRAVENOUS
  Filled 2021-04-17: qty 2

## 2021-04-17 MED ORDER — ONDANSETRON HCL 4 MG/2ML IJ SOLN: 4.0000 mg | Freq: Four times a day (QID) | INTRAMUSCULAR | Status: DC | PRN

## 2021-04-17 MED ORDER — MORPHINE SULFATE (PF) 4 MG/ML IV SOLN
4.0000 mg | INTRAVENOUS | Status: DC | PRN
Start: 2021-04-17 — End: 2021-04-19

## 2021-04-17 MED ORDER — LORAZEPAM 2 MG/ML IJ SOLN: 0.5000 mg | Freq: Two times a day (BID) | INTRAMUSCULAR | Status: DC | PRN

## 2021-04-17 MED ORDER — IODIXANOL 320 MG/ML IV SOLN
50.0000 mL | Freq: Once | INTRAVENOUS | Status: AC | PRN
  Administered 2021-04-17: 10 mL

## 2021-04-17 MED ORDER — FENTANYL CITRATE PF 50 MCG/ML IJ SOSY
PREFILLED_SYRINGE | INTRAMUSCULAR | Status: AC
  Filled 2021-04-17: qty 1

## 2021-04-17 MED ORDER — MORPHINE SULFATE (PF) 2 MG/ML IV SOLN
2.0000 mg | INTRAVENOUS | Status: DC | PRN
Start: 2021-04-17 — End: 2021-04-19

## 2021-04-17 MED ORDER — LACTATED RINGERS IV BOLUS (SEPSIS)
1000.0000 mL | Freq: Once | INTRAVENOUS | Status: AC
  Administered 2021-04-17: 1000 mL via INTRAVENOUS

## 2021-04-17 MED ORDER — SODIUM CHLORIDE 0.9% FLUSH
5.0000 mL | Freq: Three times a day (TID) | INTRAVENOUS | Status: DC
  Administered 2021-04-18 – 2021-04-20 (×4): 5 mL

## 2021-04-17 MED ORDER — VANCOMYCIN HCL 2000 MG/400ML IV SOLN
2000.0000 mg | Freq: Once | INTRAVENOUS | Status: AC
  Administered 2021-04-17: 2000 mg via INTRAVENOUS
  Filled 2021-04-17: qty 400

## 2021-04-17 MED ORDER — ACETAMINOPHEN 10 MG/ML IV SOLN
1000.0000 mg | Freq: Four times a day (QID) | INTRAVENOUS | Status: DC | PRN
  Filled 2021-04-17: qty 100

## 2021-04-17 MED ORDER — IOHEXOL 300 MG/ML  SOLN
100.0000 mL | Freq: Once | INTRAMUSCULAR | Status: AC | PRN
  Administered 2021-04-17: 100 mL via INTRAVENOUS

## 2021-04-17 MED ORDER — LACTATED RINGERS IV SOLN: INTRAVENOUS | Status: AC

## 2021-04-17 MED ORDER — PIPERACILLIN-TAZOBACTAM 3.375 G IVPB
3.3750 g | Freq: Three times a day (TID) | INTRAVENOUS | Status: DC
  Administered 2021-04-17 – 2021-04-20 (×9): 3.375 g via INTRAVENOUS
  Filled 2021-04-17 (×9): qty 50

## 2021-04-17 MED ORDER — ACETAMINOPHEN 10 MG/ML IV SOLN
1000.0000 mg | Freq: Four times a day (QID) | INTRAVENOUS | Status: DC
Start: 2021-04-17 — End: 2021-04-17
  Filled 2021-04-17 (×2): qty 100

## 2021-04-17 NOTE — Subjective & Objective (Signed)
CC: altered mental status ?HPI: ?81 year old male with a history of dementia, lives at [facility] assisted living in the memory care unit, brought to the ER due to altered mental status and suspected UTI.  Patient unable to give history. ? ?On arrival, patient febrile to 102.2, heart rate 127, blood pressure 136/92 satting 96% on 2 L. ? ?Laboratory evaluation: ? ?UA showed trace leukocyte esterase. ? ?COVID and flu negative. ? ?CMP largely unremarkable. ? ?White count 20.6, hemoglobin 15.5, platelets 232 ? ?CT chest abdomen pelvis showed distended gallbladder with extensive periapical and cystic inflammatory stranding is mild pericholecystic fluid consistent with acute cholecystitis. ? ?CT head was negative for acute intracranial abnormality. ? ?EDP discussed the case with general surgery.  Dr. Hampton Abbot request the patient be admitted by the hospitalist. ? ?

## 2021-04-17 NOTE — Progress Notes (Signed)
PHARMACY -  BRIEF ANTIBIOTIC NOTE  ? ?Pharmacy has received consult(s) for Cefepime, Vancomycin from an ED provider.  The patient's profile has been reviewed for ht/wt/allergies/indication/available labs.   ? ?One time order(s) placed for Vancomycin 2 gm IV X 1 and Cefepime 2 gm IV X 1  ? ?Further antibiotics/pharmacy consults should be ordered by admitting physician if indicated.       ?                ?Thank you, ?Valetta Mulroy D ?04/17/2021  12:51 AM ? ?

## 2021-04-17 NOTE — H&P (Signed)
?History and Physical  ? ? ?Robert Le GHW:[id number] DOB: 07-Feb-1940 DOA: 04/16/2021 ? ?DOS: the patient was seen and examined on 04/16/2021 ? ?PCP: Venia Carbon, MD  ? ?Patient coming from: [place] ? ?I have personally briefly reviewed patient's old medical records in [place] ? ?CC: altered mental status ?HPI: ?81 year old male with a history of dementia, lives at [facility] assisted living in the memory care unit, brought to the ER due to altered mental status and suspected UTI.  Patient unable to give history due to altered mental status ? ?On arrival, patient febrile to 102.2, heart rate 127, blood pressure 136/92 satting 96% on 2 L. ? ?Laboratory evaluation: ? ?UA showed trace leukocyte esterase. ? ?COVID and flu negative. ? ?CMP largely unremarkable. ? ?White count 20.6, hemoglobin 15.5, platelets 232 ? ?CT chest abdomen pelvis showed distended gallbladder with extensive periapical and cystic inflammatory stranding is mild pericholecystic fluid consistent with acute cholecystitis. ? ?CT head was negative for acute intracranial abnormality. ? ?EDP discussed the case with general surgery.  Dr. Hampton Abbot request the patient be admitted by the hospitalist.  Recommended IR consult for cholecystostomy ?  ? ?ED Course: CT shows acute cholecystitis.  Given IV cefepime and vancomycin. ? ?Review of Systems:  ?Review of Systems  ?Unable to perform ROS: Mental acuity  ? ?Past Medical History:  ?Diagnosis Date  ? Anxiety   ? Arthritis   ? Chronic kidney disease   ? L TOTAL NEPHRECTOMY  ? Hypertension   ? ? ?Past Surgical History:  ?Procedure Laterality Date  ? FRACTURE SURGERY    ? [place]  ? 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 TOTAL HIP ARTHROPLASTY Left 08/20/2013  ? Procedure: LEFT TOTAL HIP ARTHROPLASTY ANTERIOR APPROACH;  Surgeon: Mauri Pole, MD;  Location: [facility] ORS;  Service: Orthopedics;  Laterality: Left;  ? ? ? reports that he has never smoked. He  has never used smokeless tobacco. He reports that he does not drink alcohol and does not use drugs. ? ?Allergies  ?Allergen Reactions  ? Donepezil Other (See Comments)  ?  Increased confusion  ? Sulfites   ?  Unable to recall reaction or specific sulfa drug  ? ? ?Family History  ?Problem Relation Age of Onset  ? Heart disease Father   ? Arthritis Brother   ? Arthritis Brother   ? Cancer Neg Hx   ? Diabetes Neg Hx   ? ? ?Prior to Admission medications   ?Medication Sig Start Date End Date Taking? Authorizing Provider  ?amLODipine (NORVASC) 5 MG tablet Take 1 tablet (5 mg total) by mouth every morning. 05/05/16   Venia Carbon, MD  ?cholecalciferol (VITAMIN D) 1000 units tablet Take 1 tablet (1,000 Units total) by mouth daily. 05/05/16   Venia Carbon, MD  ?LORazepam (ATIVAN) 0.5 MG tablet TAKE 1 TABLET BY MOUTH TWICE A DAY AS NEEDED FOR ANXIETY 11/07/16   Venia Carbon, MD  ?LORazepam (ATIVAN) 0.5 MG tablet Take 0.5 mg by mouth at bedtime.    [provider]  ?memantine (NAMENDA) 5 MG tablet Take 1 tablet (5 mg total) by mouth 2 (two) times daily. 08/09/17   Jearld Fenton, NP  ?Multiple Vitamin (MULTIVITAMIN WITH MINERALS) TABS tablet Take 1 tablet by mouth daily. 05/05/16   Venia Carbon, MD  ?traMADol (ULTRAM) 50 MG tablet Take 50 mg by mouth 2 (two) times daily.  [provider]  ?traZODone (DESYREL) 50 MG tablet Take 2 tablets (100 mg total) by mouth at bedtime as needed for sleep. 08/09/17   Jearld Fenton, NP  ? ? ?Physical Exam: ?Vitals:  ? 04/16/21 2202 04/16/21 2352 04/17/21 0046 04/17/21 0306  ?BP: (!) 136/92 (!) 125/95  (!) 131/102  ?Pulse: (!) 127 (!) 115  (!) 113  ?Resp: '20 20  20  '$ ?Temp: (!) 101.2 ?F (38.4 ?C) 98.8 ?F (37.1 ?C)  98.8 ?F (37.1 ?C)  ?TempSrc: Oral Oral  Oral  ?SpO2: 96% 96%  95%  ?Weight:   83.2 kg   ? ? ?Physical Exam ?Vitals and nursing note reviewed.  ?  ?General:          Somnolent but arousable  ?CV:                   Tachycardic, mild systolic murmur  stent noted ?Resp:               Mild tachypnea.  No rales rhonchi.   ?Abd:                 No distention.  Abdomen soft nontender nondistended through all quadrants. ? ?Labs on Admission: I have personally reviewed following labs and imaging studies ? ?CBC: ?Recent Labs  ?Lab 04/16/21 ?2210  ?WBC 20.6*  ?NEUTROABS 17.7*  ?HGB 15.5  ?HCT 46.9  ?MCV 90.5  ?PLT 232  ? ?Basic Metabolic Panel: ?Recent Labs  ?Lab 04/16/21 ?2210  ?NA 140  ?K 4.1  ?CL 106  ?CO2 23  ?GLUCOSE 132*  ?BUN 18  ?CREATININE 1.15  ?CALCIUM 8.9  ? ?GFR: ?CrCl cannot be calculated (Unknown ideal weight.). ?Liver Function Tests: ?Recent Labs  ?Lab 04/16/21 ?2210  ?AST 27  ?ALT 44  ?ALKPHOS 70  ?BILITOT 0.9  ?PROT 6.3*  ?ALBUMIN 3.0*  ? ?No results for input(s): LIPASE, AMYLASE in the last 168 hours. ?No results for input(s): AMMONIA in the last 168 hours. ?Coagulation Profile: ?No results for input(s): INR, PROTIME in the last 168 hours. ?Cardiac Enzymes: ?Recent Labs  ?Lab 04/16/21 ?2210 04/16/21 ?2351  ?TROPONINIHS 27* 27*  ? ?BNP (last 3 results) ?No results for input(s): PROBNP in the last 8760 hours. ?HbA1C: ?No results for input(s): HGBA1C in the last 72 hours. ?CBG: ?Recent Labs  ?Lab 04/17/21 ?0404  ?GLUCAP 114*  ? ?Lipid Profile: ?No results for input(s): CHOL, HDL, LDLCALC, TRIG, CHOLHDL, LDLDIRECT in the last 72 hours. ?Thyroid Function Tests: ?No results for input(s): TSH, T4TOTAL, FREET4, T3FREE, THYROIDAB in the last 72 hours. ?Anemia Panel: ?No results for input(s): VITAMINB12, FOLATE, FERRITIN, TIBC, IRON, RETICCTPCT in the last 72 hours. ?Urine analysis: ?   ?Component Value Date/Time  ? COLORURINE AMBER (A) 04/16/2021 2211  ? APPEARANCEUR HAZY (A) 04/16/2021 2211  ? LABSPEC 1.029 04/16/2021 2211  ? PHURINE 5.0 04/16/2021 2211  ? GLUCOSEU NEGATIVE 04/16/2021 2211  ? [place] NEGATIVE 04/16/2021 2211  ? [place] NEGATIVE 04/16/2021 2211  ? KETONESUR 5 (A) 04/16/2021 2211  ? PROTEINUR 100 (A) 04/16/2021 2211  ? UROBILINOGEN 0.2  08/31/2013 1723  ? NITRITE NEGATIVE 04/16/2021 2211  ? LEUKOCYTESUR TRACE (A) 04/16/2021 2211  ? ? ?Radiological Exams on Admission: I have personally reviewed images ?CT Head Wo Contrast ? ?Result Date: 04/17/2021 ?CLINICAL DATA:  Delirium EXAM: CT HEAD WITHOUT CONTRAST TECHNIQUE: Contiguous axial images were obtained from the base of the skull through the vertex without intravenous contrast. RADIATION DOSE REDUCTION: This exam was performed according  to the departmental dose-optimization program which includes automated exposure control, adjustment of the mA and/or kV according to patient size and/or use of iterative reconstruction technique. COMPARISON:  CT head 08/31/2013 BRAIN: BRAIN Cerebral ventricle sizes are concordant with the degree of cerebral volume loss. Patchy and confluent areas of decreased attenuation are noted throughout the deep and periventricular white matter of the cerebral hemispheres bilaterally, compatible with chronic microvascular ischemic disease. No evidence of large-territorial acute infarction. No parenchymal hemorrhage. No mass lesion. No extra-axial collection. No mass effect or midline shift. No hydrocephalus. Basilar cisterns are patent. Vascular: No hyperdense vessel. Skull: No acute fracture or focal lesion. Sinuses/Orbits: Paranasal sinuses and mastoid air cells are clear. The orbits are unremarkable. Other: None. IMPRESSION: No acute intracranial abnormality. Electronically Signed   By: Iven Finn M.D.   On: 04/17/2021 00:37  ? ?CT CHEST ABDOMEN PELVIS W CONTRAST ? ?Result Date: 04/17/2021 ?CLINICAL DATA:  Fever unknown origin, neutropenia, sepsis EXAM: CT CHEST, ABDOMEN, AND PELVIS WITH CONTRAST TECHNIQUE: Multidetector CT imaging of the chest, abdomen and pelvis was performed following the standard protocol during bolus administration of intravenous contrast. RADIATION DOSE REDUCTION: This exam was performed according to the departmental dose-optimization program which  includes automated exposure control, adjustment of the mA and/or kV according to patient size and/or use of iterative reconstruction technique. CONTRAST:  117m OMNIPAQUE IOHEXOL 300 MG/ML  SOLN COMPARISON:  None.

## 2021-04-17 NOTE — ED Provider Notes (Signed)
? ?[facility] ?Provider Note ? ? ? Event Date/Time  ? First MD Initiated Contact with Patient 04/16/21 2355   ?  (approximate) ? ? ?History  ? ?UTI and Altered Mental Status ? ?EM caveat: Confusion ?HPI ? ?Robert Le is a 81 y.o. male who according to documentation and note reviewed from his nursing facility staff from today's date has a history of dementia cardiac murmur anxiety disorder and chronic kidney disease as well as an allergy to sulfites ? ?The patient was noted to have a fever elevated white blood cell count and was referred to the hospital for concerns of possible infection ? ?EMS reports the patient was suspected to possibly have a urinary tract infection.  Staff had noticed malodorous urine.  He is tachycardic. ? ?Patient himself reports he does not feel well, he cannot describe it well.  Reports he is tired.  He denies headache denies chest pain denies difficulty breathing and denies abdominal pain.  He has no specific complaint other than he just does not feel well.  He does recognize he is at a hospital, but does not recognize the date or recall the situation as to why he is here ? ?  ? ? ?Physical Exam  ? ?Triage Vital Signs: ?ED Triage Vitals [04/16/21 2202]  ?Enc Vitals Group  ?   BP (!) 136/92  ?   Pulse Rate (!) 127  ?   Resp 20  ?   Temp (!) 101.2 ?F (38.4 ?C)  ?   Temp Source Oral  ?   SpO2 96 %  ?   Weight   ?   Height   ?   Head Circumference   ?   Peak Flow   ?   Pain Score   ?   Pain Loc   ?   Pain Edu?   ?   Excl. in [place]?   ? ? ?Most recent vital signs: ?Vitals:  ? 04/16/21 2352 04/17/21 0306  ?BP: (!) 125/95 (!) 131/102  ?Pulse: (!) 115 (!) 113  ?Resp: 20 20  ?Temp: 98.8 ?F (37.1 ?C) 98.8 ?F (37.1 ?C)  ?SpO2: 96% 95%  ? ? ? ?General: Awake, no distress.  He does appear however moderately ill, generally fatigued, and generally weak.  No obvious focal deficit.  He does not appear to be in any painful distress ?CV:  Somewhat slow capillary refill noted in his  feet with palpable dorsalis pedis pulses.  Tachycardic, mild systolic murmur stent noted ?Resp:  Mild tachypnea.  No rales rhonchi.  Speaks in 1-2 word sentences and appears fatigued overall but does not appear to have acute respiratory distress ?Abd:  No distention.  Abdomen soft nontender nondistended through all quadrants. ?Other:  No lesions or sores to noted.  No cellulitic changes noted both feet examined both hands examined ? ? ?ED Results / Procedures / Treatments  ? ?Labs ?(all labs ordered are listed, but only abnormal results are displayed) ?Labs Reviewed  ?COMPREHENSIVE METABOLIC PANEL - Abnormal; Notable for the following components:  ?    Result Value  ? Glucose, Bld 132 (*)   ? Total Protein 6.3 (*)   ? Albumin 3.0 (*)   ? All other components within normal limits  ?CBC WITH DIFFERENTIAL/PLATELET - Abnormal; Notable for the following components:  ? WBC 20.6 (*)   ? Neutro Abs 17.7 (*)   ? Monocytes Absolute 1.8 (*)   ? Abs Immature Granulocytes 0.17 (*)   ?  All other components within normal limits  ?URINALYSIS, ROUTINE W REFLEX MICROSCOPIC - Abnormal; Notable for the following components:  ? Color, Urine AMBER (*)   ? APPearance HAZY (*)   ? Ketones, ur 5 (*)   ? Protein, ur 100 (*)   ? Leukocytes,Ua TRACE (*)   ? All other components within normal limits  ?TROPONIN I (HIGH SENSITIVITY) - Abnormal; Notable for the following components:  ? Troponin I (High Sensitivity) 27 (*)   ? All other components within normal limits  ?TROPONIN I (HIGH SENSITIVITY) - Abnormal; Notable for the following components:  ? Troponin I (High Sensitivity) 27 (*)   ? All other components within normal limits  ?RESP PANEL BY RT-PCR (FLU A&B, COVID) ARPGX2  ?CULTURE, BLOOD (ROUTINE X 2)  ?CULTURE, BLOOD (ROUTINE X 2)  ?LACTIC ACID, PLASMA  ?LACTIC ACID, PLASMA  ?CBG MONITORING, ED  ? ? ? ?EKG ? ?Is reviewed and interpreted by me at 2220 ?Heart rate 130 ?PR interval normal QRS complex normal, sinus tachycardia no evidence of  acute ischemia ? ? ?RADIOLOGY ? ?Chest x-ray is personally viewed interpreted by me, low lung volumes.  Difficult to exclude retrocardiac consolidation. ? ? ? ?CT Head Wo Contrast ? ?Result Date: 04/17/2021 ?CLINICAL DATA:  Delirium EXAM: CT HEAD WITHOUT CONTRAST TECHNIQUE: Contiguous axial images were obtained from the base of the skull through the vertex without intravenous contrast. RADIATION DOSE REDUCTION: This exam was performed according to the departmental dose-optimization program which includes automated exposure control, adjustment of the mA and/or kV according to patient size and/or use of iterative reconstruction technique. COMPARISON:  CT head 08/31/2013 BRAIN: BRAIN Cerebral ventricle sizes are concordant with the degree of cerebral volume loss. Patchy and confluent areas of decreased attenuation are noted throughout the deep and periventricular white matter of the cerebral hemispheres bilaterally, compatible with chronic microvascular ischemic disease. No evidence of large-territorial acute infarction. No parenchymal hemorrhage. No mass lesion. No extra-axial collection. No mass effect or midline shift. No hydrocephalus. Basilar cisterns are patent. Vascular: No hyperdense vessel. Skull: No acute fracture or focal lesion. Sinuses/Orbits: Paranasal sinuses and mastoid air cells are clear. The orbits are unremarkable. Other: None. IMPRESSION: No acute intracranial abnormality. Electronically Signed   By: Iven Finn M.D.   On: 04/17/2021 00:37  ? ?CT CHEST ABDOMEN PELVIS W CONTRAST ? ?Result Date: 04/17/2021 ?CLINICAL DATA:  Fever unknown origin, neutropenia, sepsis EXAM: CT CHEST, ABDOMEN, AND PELVIS WITH CONTRAST TECHNIQUE: Multidetector CT imaging of the chest, abdomen and pelvis was performed following the standard protocol during bolus administration of intravenous contrast. RADIATION DOSE REDUCTION: This exam was performed according to the departmental dose-optimization program which includes  automated exposure control, adjustment of the mA and/or kV according to patient size and/or use of iterative reconstruction technique. CONTRAST:  148m OMNIPAQUE IOHEXOL 300 MG/ML  SOLN COMPARISON:  None. FINDINGS: CT CHEST FINDINGS Cardiovascular: Cardiac size is mildly enlarged. No pericardial effusion. Central pulmonary arteries are enlarged in keeping with changes of pulmonary arterial hypertension. The thoracic aorta is unremarkable. Mediastinum/Nodes: No enlarged mediastinal, hilar, or axillary lymph nodes. Thyroid gland, trachea, and esophagus demonstrate no significant findings. Lungs/Pleura: Small right pleural effusion. Mild bibasilar atelectasis. No pneumothorax. No central obstructing lesion. Musculoskeletal: Pectus excavatum deformity. Osseous structures are otherwise age-appropriate. No acute bone abnormality. CT ABDOMEN PELVIS FINDINGS Hepatobiliary: The gallbladder is distended there is extensive periapical a cystic inflammatory stranding as well as mild pericholecystic fluid in keeping with changes of acute cholecystitis. There is mild perihepatic  ascites present. There is reactive hyperemia involving the liver adjacent to the gallbladder fossa. The liver is otherwise unremarkable. No intra or extrahepatic biliary ductal dilation. Pancreas: Unremarkable Spleen: Unremarkable Adrenals/Urinary Tract: The adrenal glands are unremarkable. Left nephrectomy has been performed. The residual right kidney is unremarkable. The bladder is partially obscured by streak artifact from right hip prosthesis, however, the visualized segment is unremarkable. Stomach/Bowel: Moderate sigmoid diverticulosis. There is circumferential thickening of the hepatic flexure of the colon likely related to the adjacent inflammatory process involving the gallbladder. No evidence of obstruction or perforation. The stomach, small bowel, and large bowel are otherwise unremarkable. Appendix absent. Vascular/Lymphatic: No significant  vascular findings are present. No enlarged abdominal or pelvic lymph nodes. Reproductive: Moderate prostatic enlargement. The prostate gland is partially obscured by streak artifact. Other: Small bilateral

## 2021-04-17 NOTE — H&P (Signed)
Chief Complaint: Patient was seen in consultation today for percutaneous cholecystostomy catheter drain placement  at the request of Lindajo Royal, MD  Referring Physician(s): Lindajo Royal, MD  Supervising Physician: Marliss Coots  Patient Status: [facility] - In-pt  History of Present Illness: Robert Le is a 81 y.o. male w/ PMH dementia, murmur, anxiety and CKD. Pt presented via EMS from his ALF c/o AMS and malodorous urine. Pt was admitted for acute cholecystitis. General surgery found pt not to be a candidate for surgery and referred pt to IR for percutaneous cholecystostomy catheter drain placement. Dr. Elby Showers approved procedure.   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 Authorizing Provider  acetaminophen (TYLENOL) 500 MG tablet Take 500 mg by mouth 3 (three) times daily as needed for mild pain.   Yes [provider]  buPROPion ER (WELLBUTRIN SR) 100 MG 12 hr tablet Take 100 mg by mouth 2 (two) times daily. 02/23/21  Yes [provider]  donepezil (ARICEPT) 5 MG tablet Take 5 mg by mouth daily. 02/23/21  Yes [provider]  DULoxetine (CYMBALTA) 30 MG capsule Take 30-60 mg by mouth 2 (two) times daily. 60 mg every morning and 30 mg daily at 4 pm   Yes [provider]  gabapentin (NEURONTIN) 100 MG capsule Take 200 mg by mouth 2 (two) times daily. 02/23/21  Yes [provider]  gabapentin (NEURONTIN) 600 MG tablet Take 600 mg by mouth daily. 02/23/21  Yes [provider]  memantine (NAMENDA) 10 MG tablet Take 10 mg by mouth 2 (two) times daily. 02/23/21  Yes [provider]  Multiple Vitamin (MULTIVITAMIN WITH MINERALS) TABS tablet Take 1 tablet by mouth daily. 05/05/16  Yes Karie Schwalbe, MD  polyethylene glycol (MIRALAX / GLYCOLAX) 17 g packet Take 17 g by mouth every 3 (three) days.   Yes [provider]  amLODipine (NORVASC) 5 MG tablet Take 1 tablet (5 mg total) by mouth every morning. Patient not taking: Reported on 04/17/2021 05/05/16   Karie Schwalbe, MD  cholecalciferol (VITAMIN D) 1000 units tablet Take 1 tablet (1,000 Units total) by mouth daily. Patient not taking: Reported on 04/17/2021 05/05/16   Karie Schwalbe, MD  HYDROcodone-acetaminophen (NORCO/VICODIN) 5-325 MG tablet Take 1 tablet by mouth 3 (three) times daily as needed. 03/25/21   [provider]  hydrOXYzine (ATARAX) 10 MG tablet Take 10 mg by mouth 2 (two) times daily. Patient not taking: Reported on 04/17/2021 12/22/20   [provider]  LORazepam (ATIVAN) 0.5 MG tablet TAKE 1 TABLET BY MOUTH TWICE A DAY AS NEEDED FOR ANXIETY Patient not taking: Reported on 04/17/2021 11/07/16   Karie Schwalbe, MD  LORazepam (ATIVAN) 0.5 MG tablet Take 0.5 mg by mouth at bedtime. Patient not taking: Reported on 04/17/2021    [provider]  memantine [facility])  5 MG tablet Take 1 tablet (5 mg total) by mouth 2 (two) times daily. Patient not taking: Reported on 04/17/2021 08/09/17   Lorre Munroe, NP  traMADol (ULTRAM) 50 MG tablet Take 50 mg by mouth 2 (two) times daily. Patient not taking: Reported on 04/17/2021    [provider]  traZODone (DESYREL) 50 MG tablet Take 2 tablets (100 mg total) by mouth at bedtime as needed for sleep. Patient not taking: Reported on 04/17/2021 08/09/17   Lorre Munroe, NP     Family History  Problem  Relation Age of Onset   Heart disease Father    Arthritis Brother    Arthritis Brother    Cancer Neg Hx    Diabetes Neg Hx     Social History   Socioeconomic History   Marital status: Divorced    Spouse name: Not on file   Number of children: 3   Years of education: Not on file   Highest education level: Not on file  Occupational History   Occupation: Horticulturist, commercial    Comment: Retired  Tobacco Use   Smoking status: Never   Smokeless tobacco: Never  Substance and Sexual Activity   Alcohol use: No   Drug use: No   Sexual activity: Not Currently  Other Topics Concern   Not on file  Social History Narrative   Not sure about advanced directives   Would want sons to make decisions   Would accept resuscitation attempts   Not sure about tube feeds   Social Determinants of Health   Financial Resource Strain: Not on file  Food Insecurity: Not on file  Transportation Needs: Not on file  Physical Activity: Not on file  Stress: Not on file  Social Connections: Not on file     Review of Systems: A 12 point ROS discussed and pertinent positives are indicated in the HPI above.  All other systems are negative.  Review of Systems  Unable to perform ROS: Acuity of condition   Vital Signs: BP 126/80   Pulse (!) 104   Temp 98.2 F (36.8 C) (Oral)   Resp (!) 21   Ht 5\' 7"  (1.702 m)   Wt 170 lb 3.1 oz (77.2 kg)   SpO2 99%   BMI 26.66 kg/m   Physical Exam Vitals reviewed.  Constitutional:      Appearance: He is ill-appearing.  HENT:     Head: Normocephalic and atraumatic.  Cardiovascular:     Rate and Rhythm: Regular rhythm. Tachycardia present.     Pulses: Normal pulses.     Heart sounds: Normal heart sounds.  Pulmonary:     Effort: Pulmonary effort is normal. No respiratory distress.  Abdominal:     General: Bowel sounds are normal.     Tenderness: There is abdominal tenderness. There is guarding.  Musculoskeletal:     Right lower leg: No edema.     Left  lower leg: No edema.  Skin:    General: Skin is warm and dry.  Neurological:     Mental Status: He is disoriented.    Imaging: CT Head Wo Contrast  Result Date: 04/17/2021 CLINICAL DATA:  Delirium EXAM: CT HEAD WITHOUT CONTRAST TECHNIQUE: Contiguous axial images were obtained from the base of the skull through the vertex without intravenous contrast. RADIATION DOSE REDUCTION: This exam was performed according to the departmental dose-optimization program which includes automated exposure control, adjustment of the mA and/or kV according to patient size and/or use of iterative  reconstruction technique. COMPARISON:  CT head 08/31/2013 BRAIN: BRAIN Cerebral ventricle sizes are concordant with the degree of cerebral volume loss. Patchy and confluent areas of decreased attenuation are noted throughout the deep and periventricular white matter of the cerebral hemispheres bilaterally, compatible with chronic microvascular ischemic disease. No evidence of large-territorial acute infarction. No parenchymal hemorrhage. No mass lesion. No extra-axial collection. No mass effect or midline shift. No hydrocephalus. Basilar cisterns are patent. Vascular: No hyperdense vessel. Skull: No acute fracture or focal lesion. Sinuses/Orbits: Paranasal sinuses and mastoid air cells are clear. The orbits are unremarkable. Other: None. IMPRESSION: No acute intracranial abnormality. Electronically Signed   By: Tish Frederickson M.D.   On: 04/17/2021 00:37   CT CHEST ABDOMEN PELVIS W CONTRAST  Result Date: 04/17/2021 CLINICAL DATA:  Fever unknown origin, neutropenia, sepsis EXAM: CT CHEST, ABDOMEN, AND PELVIS WITH CONTRAST TECHNIQUE: Multidetector CT imaging of the chest, abdomen and pelvis was performed following the standard protocol during bolus administration of intravenous contrast. RADIATION DOSE REDUCTION: This exam was performed according to the departmental dose-optimization program which includes automated exposure  control, adjustment of the mA and/or kV according to patient size and/or use of iterative reconstruction technique. CONTRAST:  OMNIPAQUE IOHEXOL 300 MG/ML  SOLN COMPARISON:  None. FINDINGS: CT CHEST FINDINGS Cardiovascular: Cardiac size is mildly enlarged. No pericardial effusion. Central pulmonary arteries are enlarged in keeping with changes of pulmonary arterial hypertension. The thoracic aorta is unremarkable. Mediastinum/Nodes: No enlarged mediastinal, hilar, or axillary lymph nodes. Thyroid gland, trachea, and esophagus demonstrate no significant findings. Lungs/Pleura: Small right pleural effusion. Mild bibasilar atelectasis. No pneumothorax. No central obstructing lesion. Musculoskeletal: Pectus excavatum deformity. Osseous structures are otherwise age-appropriate. No acute bone abnormality. CT ABDOMEN PELVIS FINDINGS Hepatobiliary: The gallbladder is distended there is extensive periapical a cystic inflammatory stranding as well as mild pericholecystic fluid in keeping with changes of acute cholecystitis. There is mild perihepatic ascites present. There is reactive hyperemia involving the liver adjacent to the gallbladder fossa. The liver is otherwise unremarkable. No intra or extrahepatic biliary ductal dilation. Pancreas: Unremarkable Spleen: Unremarkable Adrenals/Urinary Tract: The adrenal glands are unremarkable. Left nephrectomy has been performed. The residual right kidney is unremarkable. The bladder is partially obscured by streak artifact from right hip prosthesis, however, the visualized segment is unremarkable. Stomach/Bowel: Moderate sigmoid diverticulosis. There is circumferential thickening of the hepatic flexure of the colon likely related to the adjacent inflammatory process involving the gallbladder. No evidence of obstruction or perforation. The stomach, small bowel, and large bowel are otherwise unremarkable. Appendix absent. Vascular/Lymphatic: No significant vascular findings are  present. No enlarged abdominal or pelvic lymph nodes. Reproductive: Moderate prostatic enlargement. The prostate gland is partially obscured by streak artifact. Other: Small bilateral fat containing inguinal hernias are present Musculoskeletal: Left total hip arthroplasty has been performed. Degenerative changes are seen within the lumbar spine and right hip. No acute bone abnormality. No lytic or blastic bone lesion. IMPRESSION: Acute cholecystitis. Mild cardiomegaly with morphologic changes in keeping with pulmonary arterial hypertension. Small right pleural effusion, possibly reactive. Moderate sigmoid diverticulosis without superimposed acute inflammatory change. Electronically Signed   By: Helyn Numbers M.D.   On: 04/17/2021 00:49   DG Chest Portable 1 View  Result Date: 04/16/2021 CLINICAL DATA:  Fever.  Altered mental status. EXAM: PORTABLE CHEST 1 VIEW COMPARISON:  Radiograph 08/13/2013 FINDINGS: Progressive elevation of right hemidiaphragm. There may be retrocardiac opacity, not well assessed on this portable AP view. Heart size is obscured. Suspected vascular congestion. Difficult to  exclude left pleural effusion. No pneumothorax. Thoracic spondylosis. IMPRESSION: 1. Progressive elevation of the right hemidiaphragm. 2. Possible retrocardiac opacity, which is not well assessed on this portable AP view. Recommend PA and lateral views. 3. Suspected vascular congestion. Electronically Signed   By: Narda Rutherford M.D.   On: 04/16/2021 22:28    Labs:  CBC: Recent Labs    04/16/21 2210 04/17/21 0839  WBC 20.6* 14.8*  HGB 15.5 17.5*  HCT 46.9 54.2*  PLT 232 138*    COAGS: No results for input(s): INR, APTT in the last 8760 hours.  BMP: Recent Labs    04/16/21 2210 04/17/21 0839  NA 140 138  K 4.1 4.6  CL 106 104  CO2 23 25  GLUCOSE 132* 111*  BUN 18 16  CALCIUM 8.9 8.5*  CREATININE 1.15 1.13  GFRNONAA >60 >60    LIVER FUNCTION TESTS: Recent Labs    04/16/21 2210  04/17/21 0839  BILITOT 0.9 1.2  AST 27 30  ALT 44 35  ALKPHOS 70 61  PROT 6.3* 5.5*  ALBUMIN 3.0* 2.7*    TUMOR MARKERS: No results for input(s): AFPTM, CEA, CA199, CHROMGRNA in the last 8760 hours.  Assessment and Plan: History of dementia, murmur, anxiety and CKD. Pt presented via EMS from his ALF c/o AMS and malodorous urine. Pt was admitted for acute cholecystitis. General surgery found pt not to be a candidate for surgery and referred pt to IR for percutaneous cholecystostomy catheter drain placement. Dr. Elby Showers approved procedure.   Robert Le, pt son and POA., was contacted and gave verbal consent for procedure.  Pt resting on bed. He is not responsive to voice or touch. He does not follow commands. He grimaces when performing abdominal exam.  He has been NPO today per RN.  No blood thinners noted on chart.    Risks and benefits discussed with the patient's son including, but not limited to bleeding, infection, gallbladder perforation, bile leak, sepsis or even death.  All of the patient's son's questions were answered, son is agreeable to proceed. Consent signed and in chart.   Thank you for this interesting consult.  I greatly enjoyed meeting Robert Le and look forward to participating in their care.  A copy of this report was sent to the requesting provider on this date.  Electronically Signed: Shon Hough, NP 04/17/2021, 2:38 PM   I spent a total of 20 minutes in face to face in clinical consultation, greater than 50% of which was counseling/coordinating care for percutaneous cholecystostomy catheter drain placement.

## 2021-04-17 NOTE — ED Notes (Signed)
This nurse has attempted to call family Kraig Genis at this time. No answer ?

## 2021-04-17 NOTE — Progress Notes (Signed)
Pt tx to IR  ?

## 2021-04-17 NOTE — Assessment & Plan Note (Addendum)
--  does not appear to be a active problem now.  Not on home BP meds.  Inpatient BP wnl. ?

## 2021-04-17 NOTE — Progress Notes (Signed)
Pt being followed by [facility] for Sepsis protocol. 

## 2021-04-17 NOTE — Assessment & Plan Note (Addendum)
--  CT chest abdomen pelvis showed distended gallbladder with extensive periapical and cystic inflammatory stranding is mild pericholecystic fluid consistent with acute cholecystitis. ?--started on IV zosyn on admission ?General surgery consulted and rec perc cholecystostomy tube which was placed on 3/18 ?Plan: ?--cont IV zosyn for empiric coverage ?--drain management  ?--start clear liquid diet ?

## 2021-04-17 NOTE — ED Provider Notes (Signed)
Patient's son called, I updated him on the patient condition.  He advises he is his father's healthcare power of attorney.  Understands his father is being admitted and has received surgical consultation for conservative infected gallbladder.  He would like to speak to the surgeon, I did send a message with the patient's son's phone number to Dr. Hampton Abbot requesting that he call son as well ?  ?Delman Kitten, MD ?04/17/21 0719 ? ?

## 2021-04-17 NOTE — Assessment & Plan Note (Addendum)
--  resume home donepezil and memantine ?--resume home Cymbalta and Bupropion ?

## 2021-04-17 NOTE — Progress Notes (Signed)
Pharmacy Antibiotic Note ? ?Robert Le is a 81 y.o. male admitted on 04/16/2021 with  intra-abdominal .  Pharmacy has been consulted for Zosyn dosing. ? ?Plan: ?Zosyn 3.375 gm IV Q8H EI ordered to start on 3/18 @ 0430.  ? ?Weight: 83.2 kg (183 lb 8 oz) ? ?Temp (24hrs), Avg:99.6 ?F (37.6 ?C), Min:98.8 ?F (37.1 ?C), Max:101.2 ?F (38.4 ?C) ? ?Recent Labs  ?Lab 04/16/21 ?2210  ?WBC 20.6*  ?CREATININE 1.15  ?LATICACIDVEN 1.0  ?  ?CrCl cannot be calculated (Unknown ideal weight.).   ? ?Allergies  ?Allergen Reactions  ? Donepezil Other (See Comments)  ?  Increased confusion  ? Sulfites   ?  Unable to recall reaction or specific sulfa drug  ? ? ?Antimicrobials this admission: ?  >>  ?  >>  ? ?Dose adjustments this admission: ? ? ?Microbiology results: ? BCx:  ? UCx:   ? Sputum:   ? MRSA PCR:  ? ?Thank you for allowing pharmacy to be a part of this patient?s care. ? ?Celenia Hruska D ?04/17/2021 4:33 AM ? ?

## 2021-04-17 NOTE — Assessment & Plan Note (Addendum)
related to acute illness.  More alert today ?--hold home gabapentin, Atarax and other sedating meds. ?

## 2021-04-17 NOTE — Procedures (Signed)
Interventional Radiology Procedure Note ? ?Procedure: Image guided cholecystostomy tube placement ? ?Findings: Please refer to procedural dictation for full description.10.2 Fr, transperitoneal cholecystostomy tube placed.  Dark, blood-tinged bilious aspirate.  Placed to bag drainage. ? ?Complications: None immediate ? ?Estimated Blood Loss: < 5 ml ? ?Recommendations: ?-Keep to bag drainage ?-IR will follow ? ? ?Ruthann Cancer, MD ?Pager: [phone] ? ? ? ?

## 2021-04-17 NOTE — Assessment & Plan Note (Addendum)
--  presented with fever, tachycardia and leukocytosis. ?Due to cholecystitis.  ? ?

## 2021-04-17 NOTE — Progress Notes (Signed)
CODE SEPSIS - PHARMACY COMMUNICATION ? ?**Broad Spectrum Antibiotics should be administered within 1 hour of Sepsis diagnosis** ? ?Time Code Sepsis Called/Page Received:  3/18 @ 0001  ? ?Antibiotics Ordered: Vanc, Cefepime  ? ?Time of 1st antibiotic administration: Cefepime 2 gm IV X 1 on 3/18 @ 0010 ? ?Additional action taken by pharmacy:  ? ?If necessary, Name of Provider/Nurse Contacted:  ? ? ? ?Girtie Wiersma D ,PharmD ?Clinical Pharmacist  ?04/17/2021  12:22 AM ? ?

## 2021-04-17 NOTE — Progress Notes (Signed)
04/17/2021 ? ?Subjective: ?No acute events overnight.  This morning, the patient still having altered mental status although appears a little bit more awake compared to yesterday.  It is difficult to obtain a clear information from him.  His white blood cell count has gone down to 14.8 from 20.6 last night and his renal function remained stable.  However he has remained tachycardic overnight and is now in ICU still was low-grade tachycardia on the monitor.  Is afebrile this morning. ? ?Vital signs: ?Temp:  [98.2 ?F (36.8 ?C)-101.2 ?F (38.4 ?C)] 98.2 ?F (36.8 ?C) (03/18 1000) ?Pulse Rate:  [107-127] 111 (03/18 1200) ?Resp:  [18-25] 25 (03/18 1200) ?BP: (122-156)/(79-102) 125/86 (03/18 1200) ?SpO2:  [93 %-100 %] 94 % (03/18 1200) ?Weight:  [77.2 kg-83.2 kg] 77.2 kg (03/18 1000)  ? ?Intake/Output: ?03/17 0701 - 03/18 0700 ?In: 1000 [IV Piggyback:1000] ?Out: -  ?Last BM Date :  (PTA) ? ?Physical Exam: ?Constitutional: No acute distress ?Abdomen: Nondistended, although his abdomen feels somewhat tighter compared to overnight.  Unable to discern if the patient is having any pain on palpation. ? ?Labs:  ?Recent Labs  ?  04/16/21 ?2210 04/17/21 ?0839  ?WBC 20.6* 14.8*  ?HGB 15.5 17.5*  ?HCT 46.9 54.2*  ?PLT 232 138*  ? ?Recent Labs  ?  04/16/21 ?2210 04/17/21 ?0839  ?NA 140 138  ?K 4.1 4.6  ?CL 106 104  ?CO2 23 25  ?GLUCOSE 132* 111*  ?BUN 18 16  ?CREATININE 1.15 1.13  ?CALCIUM 8.9 8.5*  ? ?No results for input(s): LABPROT, INR in the last 72 hours. ? ?Imaging: ?CT Head Wo Contrast ? ?Result Date: 04/17/2021 ?CLINICAL DATA:  Delirium EXAM: CT HEAD WITHOUT CONTRAST TECHNIQUE: Contiguous axial images were obtained from the base of the skull through the vertex without intravenous contrast. RADIATION DOSE REDUCTION: This exam was performed according to the departmental dose-optimization program which includes automated exposure control, adjustment of the mA and/or kV according to patient size and/or use of iterative  reconstruction technique. COMPARISON:  CT head 08/31/2013 BRAIN: BRAIN Cerebral ventricle sizes are concordant with the degree of cerebral volume loss. Patchy and confluent areas of decreased attenuation are noted throughout the deep and periventricular white matter of the cerebral hemispheres bilaterally, compatible with chronic microvascular ischemic disease. No evidence of large-territorial acute infarction. No parenchymal hemorrhage. No mass lesion. No extra-axial collection. No mass effect or midline shift. No hydrocephalus. Basilar cisterns are patent. Vascular: No hyperdense vessel. Skull: No acute fracture or focal lesion. Sinuses/Orbits: Paranasal sinuses and mastoid air cells are clear. The orbits are unremarkable. Other: None. IMPRESSION: No acute intracranial abnormality. Electronically Signed   By: Iven Finn M.D.   On: 04/17/2021 00:37  ? ?CT CHEST ABDOMEN PELVIS W CONTRAST ? ?Result Date: 04/17/2021 ?CLINICAL DATA:  Fever unknown origin, neutropenia, sepsis EXAM: CT CHEST, ABDOMEN, AND PELVIS WITH CONTRAST TECHNIQUE: Multidetector CT imaging of the chest, abdomen and pelvis was performed following the standard protocol during bolus administration of intravenous contrast. RADIATION DOSE REDUCTION: This exam was performed according to the departmental dose-optimization program which includes automated exposure control, adjustment of the mA and/or kV according to patient size and/or use of iterative reconstruction technique. CONTRAST:  146m OMNIPAQUE IOHEXOL 300 MG/ML  SOLN COMPARISON:  None. FINDINGS: CT CHEST FINDINGS Cardiovascular: Cardiac size is mildly enlarged. No pericardial effusion. Central pulmonary arteries are enlarged in keeping with changes of pulmonary arterial hypertension. The thoracic aorta is unremarkable. Mediastinum/Nodes: No enlarged mediastinal, hilar, or axillary lymph nodes. Thyroid  gland, trachea, and esophagus demonstrate no significant findings. Lungs/Pleura: Small right  pleural effusion. Mild bibasilar atelectasis. No pneumothorax. No central obstructing lesion. Musculoskeletal: Pectus excavatum deformity. Osseous structures are otherwise age-appropriate. No acute bone abnormality. CT ABDOMEN PELVIS FINDINGS Hepatobiliary: The gallbladder is distended there is extensive periapical a cystic inflammatory stranding as well as mild pericholecystic fluid in keeping with changes of acute cholecystitis. There is mild perihepatic ascites present. There is reactive hyperemia involving the liver adjacent to the gallbladder fossa. The liver is otherwise unremarkable. No intra or extrahepatic biliary ductal dilation. Pancreas: Unremarkable Spleen: Unremarkable Adrenals/Urinary Tract: The adrenal glands are unremarkable. Left nephrectomy has been performed. The residual right kidney is unremarkable. The bladder is partially obscured by streak artifact from right hip prosthesis, however, the visualized segment is unremarkable. Stomach/Bowel: Moderate sigmoid diverticulosis. There is circumferential thickening of the hepatic flexure of the colon likely related to the adjacent inflammatory process involving the gallbladder. No evidence of obstruction or perforation. The stomach, small bowel, and large bowel are otherwise unremarkable. Appendix absent. Vascular/Lymphatic: No significant vascular findings are present. No enlarged abdominal or pelvic lymph nodes. Reproductive: Moderate prostatic enlargement. The prostate gland is partially obscured by streak artifact. Other: Small bilateral fat containing inguinal hernias are present Musculoskeletal: Left total hip arthroplasty has been performed. Degenerative changes are seen within the lumbar spine and right hip. No acute bone abnormality. No lytic or blastic bone lesion. IMPRESSION: Acute cholecystitis. Mild cardiomegaly with morphologic changes in keeping with pulmonary arterial hypertension. Small right pleural effusion, possibly reactive.  Moderate sigmoid diverticulosis without superimposed acute inflammatory change. Electronically Signed   By: Fidela Salisbury M.D.   On: 04/17/2021 00:49  ? ?DG Chest Portable 1 View ? ?Result Date: 04/16/2021 ?CLINICAL DATA:  Fever.  Altered mental status. EXAM: PORTABLE CHEST 1 VIEW COMPARISON:  Radiograph 08/13/2013 FINDINGS: Progressive elevation of right hemidiaphragm. There may be retrocardiac opacity, not well assessed on this portable AP view. Heart size is obscured. Suspected vascular congestion. Difficult to exclude left pleural effusion. No pneumothorax. Thoracic spondylosis. IMPRESSION: 1. Progressive elevation of the right hemidiaphragm. 2. Possible retrocardiac opacity, which is not well assessed on this portable AP view. Recommend PA and lateral views. 3. Suspected vascular congestion. Electronically Signed   By: Keith Rake M.D.   On: 04/16/2021 22:28   ? ?Assessment/Plan: ?This is a 81 y.o. male with acute cholecystitis and sepsis with tachycardia, fevers, and markedly elevated white blood cell count. ? ?- Had a conversation with the patient's son over the phone.  He lives in [place].  Discussed with him the findings on the imaging studies as well as his presenting symptoms and clinical findings.  The patient came in with fever, tachycardia, altered mental status, elevated white blood cell count, findings of acute cholecystitis with perihepatic fluid and stranding of the hepatic flexure as a result of the inflammatory changes to the gallbladder, potential vascular congestion noted on chest x-ray and potential concerns for pulmonary hypertension on his CT scan.  I think given his scenario, I still think would be more beneficial to proceed with a percutaneous cholecystostomy drain placement as currently the sepsis this may be too much stress to his body to add surgery on top as well particular when there is a potentially less invasive treatment option.  The patient's son is in agreement and all  of his questions have been answered. ?- Dr. Billie Ruddy has contacted Dr. Unk Lightning with interventional radiology for potential percutaneous cholecystostomy drain placement. ?- For now, keep  the patient n.p.o. with IV fluid hydrat

## 2021-04-17 NOTE — Consult Note (Signed)
?Date of Consultation:  04/17/2021 ? ?Requesting Physician:  Delman Kitten, MD ? ?Reason for Consultation:  Sepsis, cholecystitis ? ?History of Present Illness: ?Robert Le is a 81 y.o. male presenting via EMS from nursing facility with fever and elevated WBC.  On my evaluation the patient is hard to arouse, and I do not have any family at bedside to confirm any details or history.  Dr. Jacqualine Code has tried to reach the patient's son who is in [place] but has not heard from him.  There was concern by EMS that he may have a UTI due to malodorous urine noted by the SNF staff. ? ?In the ED, he was tachycardic, febrile to 101.2, with stable blood pressure.  His labwork showed normal LFTS but with a WBC of 20.6, normal lactic acid, and U/A showing trace LE but no bacteria.  Due to the unclear etiology, a CT scan of chest, abdomen, and pelvis was obtained, which showed concern for acute cholecystitis, with a distended gallbladder with inflammatory changes, pericholecystic fluid, surrounding inflammatory changes to the hepatic flexure and some perihepatic fluid as well.  There is also mild cardiomegaly with changes consistent with pulmonary hypertension and a right pleural effusion. ? ?Past Medical History: ?Past Medical History:  ?Diagnosis Date  ? Anxiety   ? Arthritis   ? Chronic kidney disease   ? L TOTAL NEPHRECTOMY  ? Hypertension   ?  ? ?Past Surgical History: ?Past Surgical History:  ?Procedure Laterality Date  ? FRACTURE SURGERY    ? [place]  ? 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 TOTAL HIP ARTHROPLASTY Left 08/20/2013  ? Procedure: LEFT TOTAL HIP ARTHROPLASTY ANTERIOR APPROACH;  Surgeon: Mauri Pole, MD;  Location: [facility];  Service: Orthopedics;  Laterality: Left;  ? ? ?Home Medications: ?Prior to Admission medications   ?Medication Sig Start Date End Date Taking? Authorizing Provider  ?amLODipine (NORVASC) 5 MG tablet Take 1 tablet (5 mg total) by mouth every morning.  05/05/16   Venia Carbon, MD  ?cholecalciferol (VITAMIN D) 1000 units tablet Take 1 tablet (1,000 Units total) by mouth daily. 05/05/16   Venia Carbon, MD  ?LORazepam (ATIVAN) 0.5 MG tablet TAKE 1 TABLET BY MOUTH TWICE A DAY AS NEEDED FOR ANXIETY 11/07/16   Venia Carbon, MD  ?LORazepam (ATIVAN) 0.5 MG tablet Take 0.5 mg by mouth at bedtime.    [provider]  ?memantine (NAMENDA) 5 MG tablet Take 1 tablet (5 mg total) by mouth 2 (two) times daily. 08/09/17   Jearld Fenton, NP  ?Multiple Vitamin (MULTIVITAMIN WITH MINERALS) TABS tablet Take 1 tablet by mouth daily. 05/05/16   Venia Carbon, MD  ?traMADol (ULTRAM) 50 MG tablet Take 50 mg by mouth 2 (two) times daily.    [provider]  ?traZODone (DESYREL) 50 MG tablet Take 2 tablets (100 mg total) by mouth at bedtime as needed for sleep. 08/09/17   Jearld Fenton, NP  ? ? ?Allergies: ?Allergies  ?Allergen Reactions  ? Donepezil Other (See Comments)  ?  Increased confusion  ? Sulfites   ?  Unable to recall reaction or specific sulfa drug  ? ? ?Social History: ? reports that he has never smoked. He has never used smokeless tobacco. He reports that he does not drink alcohol and does not use drugs.  ? ?Family History: ?Family History  ?Problem Relation Age of Onset  ? Heart disease Father   ?  Arthritis Brother   ? Arthritis Brother   ? Cancer Neg Hx   ? Diabetes Neg Hx   ? ? ?Review of Systems: ?Review of Systems  ?Unable to perform ROS: Mental status change  ? ?Physical Exam ?BP (!) 131/102   Pulse (!) 113   Temp 98.8 ?F (37.1 ?C) (Oral)   Resp 20   Wt 83.2 kg   SpO2 95%   BMI 28.32 kg/m?  ?CONSTITUTIONAL: No acute distress, hard to arouse. ?HEENT:  Normocephalic, atraumatic, extraocular motion intact. ?NECK: Trachea is midline, and there is no jugular venous distension. ?RESPIRATORY:  Normal respiratory effort without pathologic use of accessory muscles. ?CARDIOVASCULAR: Sinus tachycardia. ?GI: The abdomen is soft, possibly  distended, does not appear tender, but he is difficult to arouse.  No obvious peritonitis.  ?MUSCULOSKELETAL:  Normal muscle strength and tone in all four extremities.  No peripheral edema or cyanosis. ?SKIN: Skin turgor is normal. There are no pathologic skin lesions.  ?NEUROLOGIC:  Unable to fully assess. ?PSYCH:  Unable to fully assess. ? ?Laboratory Analysis: ?Results for orders placed or performed during the hospital encounter of 04/16/21 (from the past 24 hour(s))  ?Comprehensive metabolic panel     Status: Abnormal  ? Collection Time: 04/16/21 10:10 PM  ?Result Value Ref Range  ? Sodium 140 135 - 145 mmol/L  ? Potassium 4.1 3.5 - 5.1 mmol/L  ? Chloride 106 98 - 111 mmol/L  ? CO2 23 22 - 32 mmol/L  ? Glucose, Bld 132 (H) 70 - 99 mg/dL  ? BUN 18 8 - 23 mg/dL  ? Creatinine, Ser 1.15 0.61 - 1.24 mg/dL  ? Calcium 8.9 8.9 - 10.3 mg/dL  ? Total Protein 6.3 (L) 6.5 - 8.1 g/dL  ? Albumin 3.0 (L) 3.5 - 5.0 g/dL  ? AST 27 15 - 41 U/L  ? ALT 44 0 - 44 U/L  ? Alkaline Phosphatase 70 38 - 126 U/L  ? Total Bilirubin 0.9 0.3 - 1.2 mg/dL  ? GFR, Estimated >60 >60 mL/min  ? Anion gap 11 5 - 15  ?CBC with Differential     Status: Abnormal  ? Collection Time: 04/16/21 10:10 PM  ?Result Value Ref Range  ? WBC 20.6 (H) 4.0 - 10.5 K/uL  ? RBC 5.18 4.22 - 5.81 MIL/uL  ? Hemoglobin 15.5 13.0 - 17.0 g/dL  ? HCT 46.9 39.0 - 52.0 %  ? MCV 90.5 80.0 - 100.0 fL  ? MCH 29.9 26.0 - 34.0 pg  ? MCHC 33.0 30.0 - 36.0 g/dL  ? RDW 13.9 11.5 - 15.5 %  ? Platelets 232 150 - 400 K/uL  ? nRBC 0.0 0.0 - 0.2 %  ? Neutrophils Relative % 85 %  ? Neutro Abs 17.7 (H) 1.7 - 7.7 K/uL  ? Lymphocytes Relative 5 %  ? Lymphs Abs 1.0 0.7 - 4.0 K/uL  ? Monocytes Relative 9 %  ? Monocytes Absolute 1.8 (H) 0.1 - 1.0 K/uL  ? Eosinophils Relative 0 %  ? Eosinophils Absolute 0.0 0.0 - 0.5 K/uL  ? Basophils Relative 0 %  ? Basophils Absolute 0.0 0.0 - 0.1 K/uL  ? Immature Granulocytes 1 %  ? Abs Immature Granulocytes 0.17 (H) 0.00 - 0.07 K/uL  ?Troponin I (High  Sensitivity)     Status: Abnormal  ? Collection Time: 04/16/21 10:10 PM  ?Result Value Ref Range  ? Troponin I (High Sensitivity) 27 (H) <18 ng/L  ?Lactic acid, plasma     Status: None  ?  Collection Time: 04/16/21 10:10 PM  ?Result Value Ref Range  ? Lactic Acid, Venous 1.0 0.5 - 1.9 mmol/L  ?Resp Panel by RT-PCR (Flu A&B, Covid) Nasopharyngeal Swab     Status: None  ? Collection Time: 04/16/21 10:11 PM  ? Specimen: Nasopharyngeal Swab; Nasopharyngeal(NP) swabs in vial transport medium  ?Result Value Ref Range  ? SARS Coronavirus 2 by RT PCR NEGATIVE NEGATIVE  ? Influenza A by PCR NEGATIVE NEGATIVE  ? Influenza B by PCR NEGATIVE NEGATIVE  ?Urinalysis, Routine w reflex microscopic Nasopharyngeal Swab     Status: Abnormal  ? Collection Time: 04/16/21 10:11 PM  ?Result Value Ref Range  ? Color, Urine AMBER (A) YELLOW  ? APPearance HAZY (A) CLEAR  ? Specific Gravity, Urine 1.029 1.005 - 1.030  ? pH 5.0 5.0 - 8.0  ? Glucose, UA NEGATIVE NEGATIVE mg/dL  ? Hgb urine dipstick NEGATIVE NEGATIVE  ? Bilirubin Urine NEGATIVE NEGATIVE  ? Ketones, ur 5 (A) NEGATIVE mg/dL  ? Protein, ur 100 (A) NEGATIVE mg/dL  ? Nitrite NEGATIVE NEGATIVE  ? Leukocytes,Ua TRACE (A) NEGATIVE  ? RBC / HPF 6-10 0 - 5 RBC/hpf  ? WBC, UA 6-10 0 - 5 WBC/hpf  ? Bacteria, UA NONE SEEN NONE SEEN  ? Squamous Epithelial / LPF NONE SEEN 0 - 5  ? Mucus PRESENT   ? Ca Oxalate Crys, UA PRESENT   ?Troponin I (High Sensitivity)     Status: Abnormal  ? Collection Time: 04/16/21 11:51 PM  ?Result Value Ref Range  ? Troponin I (High Sensitivity) 27 (H) <18 ng/L  ?POC CBG, ED     Status: Abnormal  ? Collection Time: 04/17/21  4:04 AM  ?Result Value Ref Range  ? Glucose-Capillary 114 (H) 70 - 99 mg/dL  ? ? ?Imaging: ?CT Head Wo Contrast ? ?Result Date: 04/17/2021 ?CLINICAL DATA:  Delirium EXAM: CT HEAD WITHOUT CONTRAST TECHNIQUE: Contiguous axial images were obtained from the base of the skull through the vertex without intravenous contrast. RADIATION DOSE REDUCTION:  This exam was performed according to the departmental dose-optimization program which includes automated exposure control, adjustment of the mA and/or kV according to patient size and/or use of iterative reconstruction tech

## 2021-04-18 DIAGNOSIS — K810 Acute cholecystitis: Secondary | ICD-10-CM | POA: Diagnosis not present

## 2021-04-18 DIAGNOSIS — A419 Sepsis, unspecified organism: Secondary | ICD-10-CM | POA: Diagnosis not present

## 2021-04-18 DIAGNOSIS — R4182 Altered mental status, unspecified: Secondary | ICD-10-CM | POA: Diagnosis not present

## 2021-04-18 LAB — COMPREHENSIVE METABOLIC PANEL
ALT: 31 U/L (ref 0–44)
AST: 24 U/L (ref 15–41)
Albumin: 2.5 g/dL — ABNORMAL LOW (ref 3.5–5.0)
Alkaline Phosphatase: 62 U/L (ref 38–126)
Anion gap: 8 (ref 5–15)
BUN: 21 mg/dL (ref 8–23)
CO2: 23 mmol/L (ref 22–32)
Calcium: 8.8 mg/dL — ABNORMAL LOW (ref 8.9–10.3)
Chloride: 108 mmol/L (ref 98–111)
Creatinine, Ser: 1.07 mg/dL (ref 0.61–1.24)
GFR, Estimated: >60 mL/min (ref >60)
Glucose, Bld: 114 mg/dL — ABNORMAL HIGH (ref 70–99)
Potassium: 3.8 mmol/L (ref 3.5–5.1)
Sodium: 139 mmol/L (ref 135–145)
Total Bilirubin: 1 mg/dL (ref 0.3–1.2)
Total Protein: 5.6 g/dL — ABNORMAL LOW (ref 6.5–8.1)

## 2021-04-18 LAB — CBC WITH DIFFERENTIAL/PLATELET
Abs Immature Granulocytes: 0.12 10*3/uL — ABNORMAL HIGH (ref 0.00–0.07)
Basophils Absolute: 0 10*3/uL (ref 0.0–0.1)
Basophils Relative: 0 %
Eosinophils Absolute: 0.1 10*3/uL (ref 0.0–0.5)
Eosinophils Relative: 1 %
HCT: 42.2 % (ref 39.0–52.0)
Hemoglobin: 13.8 g/dL (ref 13.0–17.0)
Immature Granulocytes: 1 %
Lymphocytes Relative: 7 %
Lymphs Abs: 1 10*3/uL (ref 0.7–4.0)
MCH: 29.6 pg (ref 26.0–34.0)
MCHC: 32.7 g/dL (ref 30.0–36.0)
MCV: 90.4 fL (ref 80.0–100.0)
Monocytes Absolute: 1.3 10*3/uL — ABNORMAL HIGH (ref 0.1–1.0)
Monocytes Relative: 9 %
Neutro Abs: 12.1 10*3/uL — ABNORMAL HIGH (ref 1.7–7.7)
Neutrophils Relative %: 82 %
Platelets: 219 10*3/uL (ref 150–400)
RBC: 4.67 MIL/uL (ref 4.22–5.81)
RDW: 14.1 % (ref 11.5–15.5)
WBC: 14.7 10*3/uL — ABNORMAL HIGH (ref 4.0–10.5)
nRBC: 0 % (ref 0.0–0.2)

## 2021-04-18 LAB — BLOOD GAS, ARTERIAL
Acid-Base Excess: 0.1 mmol/L (ref 0.0–2.0)
Bicarbonate: 23.6 mmol/L (ref 20.0–28.0)
FIO2: 28 %
O2 Content: 2 L/min
O2 Saturation: 97.3 %
Patient temperature: 37
pCO2 arterial: 34 mmHg (ref 32–48)
pH, Arterial: 7.45 (ref 7.35–7.45)
pO2, Arterial: 74 mmHg — ABNORMAL LOW (ref 83–108)

## 2021-04-18 LAB — MAGNESIUM: Magnesium: 2.1 mg/dL (ref 1.7–2.4)

## 2021-04-18 MED ORDER — DEXTROSE-NACL 5-0.9 % IV SOLN
INTRAVENOUS | Status: DC
  Filled 2021-04-18: qty 1000

## 2021-04-18 MED ORDER — ORAL CARE MOUTH RINSE
15.0000 mL | Freq: Two times a day (BID) | OROMUCOSAL | Status: DC
  Administered 2021-04-18 – 2021-04-19 (×2): 15 mL via OROMUCOSAL

## 2021-04-18 MED ORDER — CHLORHEXIDINE GLUCONATE 0.12 % MT SOLN
15.0000 mL | Freq: Two times a day (BID) | OROMUCOSAL | Status: DC
  Administered 2021-04-18 – 2021-04-19 (×4): 15 mL via OROMUCOSAL
  Filled 2021-04-18 (×3): qty 15

## 2021-04-18 MED ORDER — ENOXAPARIN SODIUM 40 MG/0.4ML IJ SOSY
40.0000 mg | PREFILLED_SYRINGE | INTRAMUSCULAR | Status: DC
Start: 2021-04-18 — End: 2021-04-20
  Administered 2021-04-18 – 2021-04-19 (×2): 40 mg via SUBCUTANEOUS
  Filled 2021-04-18 (×2): qty 0.4

## 2021-04-18 NOTE — Progress Notes (Signed)
04/18/2021 ? ?Subjective: ?No acute events overnight.  Patient had a percutaneous cholecystostomy drain placed yesterday by Dr. Serafina Royals with interventional radiology.  This morning, the patient still altered and not well able to follow commands.  He initially does not arouse well when I see him but started waking up more as I was pushing on his abdomen.  White blood cell count is stable at 14.7 and his tachycardia has improved as well. ? ?Vital signs: ?Temp:  [98.4 ?F (36.9 ?C)-100.5 ?F (38.1 ?C)] 98.8 ?F (37.1 ?C) (03/19 1100) ?Pulse Rate:  [83-111] 83 (03/19 1100) ?Resp:  [16-30] 16 (03/19 1100) ?BP: (120-145)/(73-105) 127/80 (03/19 1100) ?SpO2:  [90 %-100 %] 100 % (03/19 1100)  ? ?Intake/Output: ?03/18 0701 - 03/19 0700 ?In: 1595.3 [I.V.:1452.4; IV Piggyback:143] ?Out: 600 [Urine:500; Drains:100] ?Last BM Date :  (PTA) ? ?Physical Exam: ?Constitutional: No acute distress but with altered mental status ?Abdomen: Soft, nondistended, with some mild tenderness to palpation in the right upper quadrant which caused him to wake up when I was evaluating him.  Right upper quadrant gallbladder drain in place with bloody bilious fluid. ? ?Labs:  ?Recent Labs  ?  04/17/21 ?8469 04/18/21 ?0401  ?WBC 14.8* 14.7*  ?HGB 17.5* 13.8  ?HCT 54.2* 42.2  ?PLT 138* 219  ? ?Recent Labs  ?  04/17/21 ?6295 04/18/21 ?0401  ?NA 138 139  ?K 4.6 3.8  ?CL 104 108  ?CO2 25 23  ?GLUCOSE 111* 114*  ?BUN 16 21  ?CREATININE 1.13 1.07  ?CALCIUM 8.5* 8.8*  ? ?No results for input(s): LABPROT, INR in the last 72 hours. ? ?Imaging: ?No results found. ? ?Assessment/Plan: ?This is a 81 y.o. male with acute cholecystitis the setting of sepsis with tachycardia and fevers. ? ?- No complications from drain procedure yesterday.  White blood cell count is stable.  Creatinine mildly improved.  Tachycardia is improving as well.  Only had a low-grade temp yesterday.  For now, continue IV antibiotics empirically for his cholecystitis.  Apparently no cultures were  sent during drain placement yesterday. ?-Normally I would start the patient on a clear liquid diet.  However I am unable to tell given his mental status how well he would be able to tolerate a diet.  As a precaution, we will place his speech therapy consult to evaluate for swallowing prior to doing any diet changes. ?- We will continue to follow along with you. ? ? ?I spent 25 minutes dedicated to the care of this patient on the date of this encounter to include pre-visit review of records, face-to-face time with the patient discussing diagnosis and management, and any post-visit coordination of care. ? ?Robert Neth, MD ?[facility]  ?

## 2021-04-18 NOTE — Hospital Course (Signed)
81 year old male with a history of dementia, lives at [facility] assisted living in the memory care unit, brought to the ER due to altered mental status. ?

## 2021-04-18 NOTE — Progress Notes (Signed)
Patient altered mental status. Intermittently follows commands. NPO, orders for SLP evaluation. Drain intact with 50 cc output.  Patient incontinent. MD in assessing patient. Patient has orders to tx to floor. No cardiac monitoring per MD. Report being called to [facility].Continue to monitor. ?

## 2021-04-18 NOTE — Evaluation (Signed)
Clinical/Bedside Swallow Evaluation ?Patient Details  ?Name: Robert Le ?MRN: [id number] ?Date of Birth: 05/21/1940 ? ?Today's Date: 04/18/2021 ?Time: SLP Start Time (ACUTE ONLY): 7681 SLP Stop Time (ACUTE ONLY): 1572 ?SLP Time Calculation (min) (ACUTE ONLY): 10 min ? ?Past Medical History:  ?Past Medical History:  ?Diagnosis Date  ? Anxiety   ? Arthritis   ? Chronic kidney disease   ? L TOTAL NEPHRECTOMY  ? Hypertension   ? ?Past Surgical History:  ?Past Surgical History:  ?Procedure Laterality Date  ? FRACTURE SURGERY    ? [place]  ? 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 TOTAL HIP ARTHROPLASTY Left 08/20/2013  ? Procedure: LEFT TOTAL HIP ARTHROPLASTY ANTERIOR APPROACH;  Surgeon: Mauri Pole, MD;  Location: [facility];  Service: Orthopedics;  Laterality: Left;  ? ?HPI:  ?Per H&P "81 year old male with a history of dementia, lives at [facility] assisted living in the memory care unit, brought to the ER due to altered mental status and suspected UTI.  Patient unable to give history due to altered mental status." Pt s/p percutaneous cholecystostomy drain placement on 04/17/21.  ?  ?Assessment / Plan / Recommendation  ?Clinical Impression ? Pt seen for clinical swallowing evaluation. Pt with waxing/waning LOA. [place] with verbal/tactile cueing. Limited verbalizations. ? ?Attempted trials of ice chips via teaspoon and thin liquids via cup and straw as well as oral care. Pt aversive to oral stimuli (including oral care) as evidenced by clinching teeth/jaw closed.  Thermal and tactile stim provided; however, efforts well unsuccessful. Additionally, did not follow directions for duration of evaluation (e.g. open mouth). Pharyngeal swallow could not be assessed due to orally aversive behaviors.  ? ?At present, a safe oral diet cannot be recommended. Recommend NPO with non-oral means of nutrition/hydration/medication.  ? ?SLP to f/u for re-assessment next date.  ? ?RN made aware of  results, recommendations, and SLP POC.  ? ?SLP Visit Diagnosis: Dysphagia, unspecified (R13.10) ?   ?Aspiration Risk ? Risk for inadequate nutrition/hydration  ?  ?Diet Recommendation NPO  ? ?Medication Administration: Via alternative means  ?  ?Other  Recommendations Oral Care Recommendations: Oral care QID;Staff/trained caregiver to provide oral care   ? ?Recommendations for follow up therapy are one component of a multi-disciplinary discharge planning process, led by the attending physician.  Recommendations may be updated based on patient status, additional functional criteria and insurance authorization. ? ?Follow up Recommendations  (TBD)  ? ? ?  ?Assistance Recommended at Discharge Frequent or constant Supervision/Assistance  ?Functional Status Assessment Patient has had a recent decline in their functional status and demonstrates the ability to make significant improvements in function in a reasonable and predictable amount of time.  ?Frequency and Duration min 2x/week  ?2 weeks ?  ?   ? ?Prognosis Prognosis for Safe Diet Advancement: Guarded ?Barriers to Reach Goals: Cognitive deficits;Severity of deficits;Behavior  ? ?  ? ?Swallow Study   ?General Date of Onset: 04/16/21 ?HPI: Per H&P "81 year old male with a history of dementia, lives at [facility] assisted living in the memory care unit, brought to the ER due to altered mental status and suspected UTI.  Patient unable to give history due to altered mental status." Pt s/p percutaneous cholecystostomy drain placement on 04/17/21. ?Type of Study: Bedside Swallow Evaluation ?Previous Swallow Assessment: unknown ?Diet Prior to this Study: NPO ?Temperature Spikes Noted: Yes ?Respiratory Status: Nasal cannula ?History of Recent Intubation: No ?Behavior/Cognition: Lethargic/Drowsy;Doesn't follow directions ?Oral  Cavity Assessment:  (unable to assess; aversive to oral stimuli; did not follow directions) ?Oral Care Completed by SLP: Other (Comment) (attempted;   aversive to oral stimuli; did not follow directions) ?Oral Cavity - Dentition: Other (Comment) (unable to assess; aversive to oral stimuli; did not follow directions) ?Self-Feeding Abilities:  (suspect need for total assistance given AMS) ?Patient Positioning: Upright in bed ?Baseline Vocal Quality: Normal ?Volitional Cough: Cognitively unable to elicit ?Volitional Swallow: Unable to elicit  ?  ?Oral/Motor/Sensory Function Overall Oral Motor/Sensory Function: Other (comment) (unable to assess; aversive to oral stimuli; did not follow directions)   ?Ice Chips Ice chips: Impaired ?Presentation: Spoon ?Oral Phase Impairments:  (aversive to oral stimuli; clinching teeth/jaw closed; tactile stim provided; did not follow directions) ?Pharyngeal Phase Impairments:  (unable to assess)   ?Thin Liquid Thin Liquid: Impaired ?Presentation: Cup;Straw ?Oral Phase Impairments:  (aversive to oral stimuli; clinching teeth/jaw closed; tactile stim provided; did not follow directions) ?Pharyngeal  Phase Impairments:  (unable to assess)  ?  ?   ?   ?   ?   ?Cherrie Gauze, M.S., CCC-SLP ?Speech-Language Pathologist ?[facility] ?[phone] ([place])  ? ?[name] ?04/18/2021,2:25 PM ? ? ? ?

## 2021-04-18 NOTE — Progress Notes (Signed)
?  Progress Note ? ? ?Patient: Robert Le:[id number] DOB: 1940/10/22 DOA: 04/16/2021     1 ?DOS: the patient was seen and examined on 04/18/2021 ?  ?Brief hospital course: ?81 year old male with a history of dementia, lives at [facility] assisted living in the memory care unit, brought to the ER due to altered mental status. ? ?Assessment and Plan: ?* Acute cholecystitis ?--CT chest abdomen pelvis showed distended gallbladder with extensive periapical and cystic inflammatory stranding is mild pericholecystic fluid consistent with acute cholecystitis. ?--started on IV zosyn on admission ?General surgery consulted and rec perc cholecystostomy tube which was placed on 3/18 ?Plan: ?--cont IV zosyn for empiric coverage ?--drain management  ? ?Acute metabolic encephalopathy ?related to acute illness.  Pt remains somnolent. ?--NPO for now until pt is alert enough to attempt oral intake ?--hold home gabapentin, Atarax and other sedating meds. ? ?Sepsis ([place]) ?--presented with fever, tachycardia and leukocytosis. ?Due to cholecystitis.  ? ? ?Alzheimer's dementia without behavioral disturbance ([place]) ?--hold home donepezil and memantine while NPO ? ?History of hypertension ?--does not appear to be a active problem now.  Not on home BP meds.  Inpatient BP wnl. ? ? ? ? ?  ? ?Subjective:  ?Had perc tube placement yesterday.  Pt remained somnolent and sleeping most the time. ? ? ?Physical Exam: ? ?Constitutional: NAD, sleeping ?CV: No cyanosis.   ?RESP: normal respiratory effort ?GI: abdominal drain present with dark bloody drainage ?Extremities: No effusions, edema in BLE ?SKIN: warm, dry ? ? ?Data Reviewed: ? ?Family Communication:  ? ?Disposition: ?Status is: Inpatient ?Remains inpatient appropriate because: on IV abx, too somnolent for oral intake ? ? Planned Discharge Destination:  [place] assisted living in the memory care unit ? ? ? ?Time spent: 35 minutes ? ?Author: ?Enzo Bi, MD ?04/18/2021 1:54 PM ? ?For on call  review [email or web address].  ?

## 2021-04-19 DIAGNOSIS — K810 Acute cholecystitis: Secondary | ICD-10-CM | POA: Diagnosis not present

## 2021-04-19 DIAGNOSIS — A419 Sepsis, unspecified organism: Secondary | ICD-10-CM | POA: Diagnosis not present

## 2021-04-19 LAB — BASIC METABOLIC PANEL
Anion gap: 6 (ref 5–15)
BUN: 23 mg/dL (ref 8–23)
CO2: 28 mmol/L (ref 22–32)
Calcium: 8.6 mg/dL — ABNORMAL LOW (ref 8.9–10.3)
Chloride: 108 mmol/L (ref 98–111)
Creatinine, Ser: 0.96 mg/dL (ref 0.61–1.24)
GFR, Estimated: >60 mL/min (ref >60)
Glucose, Bld: 102 mg/dL — ABNORMAL HIGH (ref 70–99)
Potassium: 3.7 mmol/L (ref 3.5–5.1)
Sodium: 142 mmol/L (ref 135–145)

## 2021-04-19 LAB — CBC
HCT: 42.3 % (ref 39.0–52.0)
Hemoglobin: 13.8 g/dL (ref 13.0–17.0)
MCH: 29.9 pg (ref 26.0–34.0)
MCHC: 32.6 g/dL (ref 30.0–36.0)
MCV: 91.6 fL (ref 80.0–100.0)
Platelets: 230 10*3/uL (ref 150–400)
RBC: 4.62 MIL/uL (ref 4.22–5.81)
RDW: 13.9 % (ref 11.5–15.5)
WBC: 8.1 10*3/uL (ref 4.0–10.5)
nRBC: 0 % (ref 0.0–0.2)

## 2021-04-19 LAB — MAGNESIUM: Magnesium: 2.4 mg/dL (ref 1.7–2.4)

## 2021-04-19 MED ORDER — DULOXETINE HCL 30 MG PO CPEP
30.0000 mg | ORAL_CAPSULE | Freq: Every day | ORAL | Status: DC
Start: 2021-04-19 — End: 2021-04-20
  Filled 2021-04-19: qty 1

## 2021-04-19 MED ORDER — DULOXETINE HCL 30 MG PO CPEP
60.0000 mg | ORAL_CAPSULE | Freq: Every day | ORAL | Status: DC
  Administered 2021-04-20: 60 mg via ORAL
  Filled 2021-04-19: qty 2

## 2021-04-19 MED ORDER — DONEPEZIL HCL 5 MG PO TABS
5.0000 mg | ORAL_TABLET | Freq: Every day | ORAL | Status: DC
  Administered 2021-04-20: 5 mg via ORAL
  Filled 2021-04-19 (×2): qty 1

## 2021-04-19 MED ORDER — BUPROPION HCL ER (SR) 100 MG PO TB12
100.0000 mg | ORAL_TABLET | Freq: Two times a day (BID) | ORAL | Status: DC
  Administered 2021-04-20: 100 mg via ORAL
  Filled 2021-04-19 (×2): qty 1

## 2021-04-19 MED ORDER — MEMANTINE HCL 5 MG PO TABS
10.0000 mg | ORAL_TABLET | Freq: Two times a day (BID) | ORAL | Status: DC
  Administered 2021-04-20: 10 mg via ORAL
  Filled 2021-04-19 (×2): qty 2

## 2021-04-19 NOTE — TOC Progression Note (Signed)
Transition of Care (TOC) - Progression Note  ? ? ?Patient Details  ?Name: Robert Le ?MRN: [id number] ?Date of Birth: 1940/11/19 ? ?Transition of Care (TOC) CM/SW Contact  ?Pete Pelt, RN ?Phone Number: ?04/19/2021, 3:11 PM ? ?Clinical Narrative:   Patient is from [facility] and as per Seth Bake at facility can return to [facility] on [place].  TOC to follow ? ? ? ?  ?  ? ?Expected Discharge Plan and Services ?  ?  ?  ?  ?  ?                ?  ?  ?  ?  ?  ?  ?  ?  ?  ?  ? ? ?Social Determinants of Health (SDOH) Interventions ?  ? ?Readmission Risk Interventions ?No flowsheet data found. ? ?

## 2021-04-19 NOTE — Evaluation (Signed)
Physical Therapy Evaluation ?Patient Details ?Name: Robert Le ?MRN: [id number] ?DOB: 1940-10-16 ?Today's Date: 04/19/2021 ? ?History of Present Illness ? Robert Le is a 81 y.o. male w/ PMH dementia, murmur, anxiety and CKD. Pt presented via EMS from his ALF c/o AMS and malodorous urine. Pt was admitted for acute cholecystitis. General surgery found pt not to be a candidate for surgery and referred pt to IR for percutaneous cholecystostomy catheter drain placement. ?  ?Clinical Impression ? Patient received in bed, limited eye contact, does not answer questions, does not follow direction and is not oriented. He is verbal, but not making sense. Patient required +2 total assist for bed mobility. Once positioned he is able to sit at edge of bed with min guard.  Patient may not be appropriate for continued PT, but will try another time or two to see if transfer/ambulation assessment can bed done.    ?   ? ?Recommendations for follow up therapy are one component of a multi-disciplinary discharge planning process, led by the attending physician.  Recommendations may be updated based on patient status, additional functional criteria and insurance authorization. ? ?Follow Up Recommendations No PT follow up ? ?  ?Assistance Recommended at Discharge Frequent or constant Supervision/Assistance  ?Patient can return home with the following ? Two people to help with walking and/or transfers;Two people to help with bathing/dressing/bathroom;Assist for transportation;Assistance with cooking/housework;Assistance with feeding ? ?  ?Equipment Recommendations None recommended by PT  ?Recommendations for Other Services ?    ?  ?Functional Status Assessment Patient has had a recent decline in their functional status and demonstrates the ability to make significant improvements in function in a reasonable and predictable amount of time.  ? ?  ?Precautions / Restrictions Precautions ?Precautions: Fall ?Restrictions ?Weight  Bearing Restrictions: No  ? ?  ? ?Mobility ? Bed Mobility ?Overal bed mobility: Needs Assistance ?Bed Mobility: Supine to Sit, Sit to Supine ?  ?  ?Supine to sit: +2 for physical assistance, +2 for safety/equipment, HOB elevated, Total assist ?Sit to supine: +2 for physical assistance, +2 for safety/equipment, Total assist ?  ?General bed mobility comments: able to sit edge of bed with min guard once positioned there ?  ? ?Transfers ?  ?  ?  ?  ?  ?  ?  ?  ?  ?General transfer comment: unsafe to attempt ?  ? ?Ambulation/Gait ?  ?  ?  ?  ?  ?  ?  ?General Gait Details: unsafe to attempt ? ?Stairs ?  ?  ?  ?  ?  ? ?Wheelchair Mobility ?  ? ?Modified Rankin (Stroke Patients Only) ?  ? ?  ? ?Balance Overall balance assessment: Needs assistance ?Sitting-balance support: Feet supported ?Sitting balance-Leahy Scale: Fair ?  ?  ?  ?  ?Standing balance comment: not attempted ?  ?  ?  ?  ?  ?  ?  ?  ?  ?  ?  ?   ? ? ? ?Pertinent Vitals/Pain Pain Assessment ?Pain Assessment: No/denies pain ?Breathing: normal ?Negative Vocalization: none ?Facial Expression: smiling or inexpressive ?Body Language: relaxed ?Consolability: no need to console ?PAINAD Score: 0  ? ? ?Home Living Family/patient expects to be discharged to:: Skilled nursing facility ?  ?  ?  ?  ?  ?  ?  ?  ?  ?   ?  ?Prior Function   ?  ?  ?  ?  ?  ?  ?Mobility Comments: unusre  of mobility at baseline. Patient is unable to answer questions, unable to follow directions. ?ADLs Comments: unsure ?  ? ? ?Hand Dominance  ?   ? ?  ?Extremity/Trunk Assessment  ? Upper Extremity Assessment ?Upper Extremity Assessment: Overall WFL for tasks assessed ?  ? ?Lower Extremity Assessment ?Lower Extremity Assessment: Overall WFL for tasks assessed ?  ? ?Cervical / Trunk Assessment ?Cervical / Trunk Assessment: Normal  ?Communication  ? Communication: Other (comment) (cognition limiting communication)  ?Cognition Arousal/Alertness: Awake/alert ?Behavior During Therapy: Flat  affect ?Overall Cognitive Status: Difficult to assess ?  ?  ?  ?  ?  ?  ?  ?  ?  ?  ?  ?  ?  ?  ?  ?  ?General Comments: cognition ?  ?  ? ?  ?General Comments   ? ?  ?Exercises    ? ?Assessment/Plan  ?  ?PT Assessment Patient needs continued PT services  ?PT Problem List Decreased mobility;Decreased cognition;Decreased balance;Decreased activity tolerance ? ?   ?  ?PT Treatment Interventions Gait training;Functional mobility training;Therapeutic activities   ? ?PT Goals (Current goals can be found in the Care Plan section)  ?Acute Rehab PT Goals ?Patient Stated Goal: none stated ?PT Goal Formulation: Patient unable to participate in goal setting ?Time For Goal Achievement: 04/30/21 ? ?  ?Frequency Min 2X/week ?  ? ? ?Co-evaluation PT/OT/SLP Co-Evaluation/Treatment: Yes ?Reason for Co-Treatment: For patient/therapist safety;Necessary to address cognition/behavior during functional activity;To address functional/ADL transfers ?PT goals addressed during session: Mobility/safety with mobility ?  ?  ? ? ?  ?AM-PAC PT "6 Clicks" Mobility  ?Outcome Measure Help needed turning from your back to your side while in a flat bed without using bedrails?: Total ?  ?Help needed moving to and from a bed to a chair (including a wheelchair)?: Total ?Help needed standing up from a chair using your arms (e.g., wheelchair or bedside chair)?: Total ?Help needed to walk in hospital room?: Total ?Help needed climbing 3-5 steps with a railing? : Total ?6 Click Score: 5 ? ?  ?End of Session   ?Activity Tolerance: Other (comment) (limited by cognition) ?Patient left: in bed;with bed alarm set ?Nurse Communication: Mobility status ?PT Visit Diagnosis: Other abnormalities of gait and mobility (R26.89) ?  ? ?Time: 1352-1401 ?PT Time Calculation (min) (ACUTE ONLY): 9 min ? ? ?Charges:   PT Evaluation ?$PT Eval Moderate Complexity: 1 Mod ?  ?  ?   ? ? ?Amanda Cockayne, PT, GCS ?04/19/21,2:18 PM ? ?

## 2021-04-19 NOTE — Progress Notes (Signed)
Speech Language Pathology Treatment:    ?Patient Details ?Name: Robert Le ?MRN: [id number] ?DOB: Nov 16, 1940 ?Today's Date: 04/19/2021 ?Time: 7829-5621 ?SLP Time Calculation (min) (ACUTE ONLY): 18 min ? ?Assessment / Plan / Recommendation ?Clinical Impression ? Pt seen for clincial swallowing re-evaluation. Pt alert and participatory. Pt easily distracted by environmental stimuli. Limited verbalizations. Confusion evident. Cleared with RN.  ? ?Per Robert Le (Surgery) note pt OK to advance to clear liquids pending SLP evaluation.  ? ?Pt given trials of thin liquids via straw and puree (jello - clear liquid). Pt unable to feed self despite verbal/tactile cueing (hand-over-hand assistance). ?baseline functional status for ADLs - noted pt resides in [facility] facility. Pt demonstrated a grossly functional oral phase with mildly prolonged A-P transit with puree. Pharyngeal swallow appeared [facility] per clinical assessment. No overt or subtle s/sx pharyngeal dysphagia. To palpation, seemingly timely swallow initiation and seemingly adequate laryngeal elevation.  ? ?Recommend initiation of a clear liquid diet with safe swallowing strategies/aspiration precautions as outliend below.  ? ?Pt is at increased risk for aspiration/aspiration PNA given AMS/dementia, dependence for feeding, and multiple medical comorbidities.  ? ?SLP to f/u per POC for diet tolerance and trials of upgraded textures as appropriate.  ? ?RN made aware of results, recommendations, and SLP POC. Communication board in pt's room updated accordingly.  ?  ?HPI HPI: Per H&P "81 year old male with a history of dementia, lives at [facility] assisted living in the memory care unit, brought to the ER due to altered mental status and suspected UTI.  Patient unable to give history due to altered mental status." Pt s/p percutaneous cholecystostomy drain placement on 04/17/21. ?  ?   ?SLP Plan ? Continue with current plan of care ? ?  ?  ?Recommendations for  follow up therapy are one component of a multi-disciplinary discharge planning process, led by the attending physician.  Recommendations may be updated based on patient status, additional functional criteria and insurance authorization. ?  ? ?Recommendations  ?Diet recommendations: Thin liquid (clear liquids per Dr. Hampton Le) ?Medication Administration: Whole meds with liquid (as tolerated) ?Supervision: Staff to assist with self feeding;Full supervision/cueing for compensatory strategies ?Compensations: Minimize environmental distractions;Slow rate;Small sips/bites ?Postural Changes and/or Swallow Maneuvers: Out of bed for meals;Upright 30-60 min after meal;Seated upright 90 degrees  ?   ?    ?   ? ? ? ? Oral Care Recommendations: Oral care QID;Staff/trained caregiver to provide oral care ?Follow Up Recommendations: Skilled nursing-short term rehab (<3 hours/day) ?Assistance recommended at discharge: Frequent or constant Supervision/Assistance ?SLP Visit Diagnosis: Dysphagia, unspecified (R13.10) ?Plan: Continue with current plan of care ? ? ? ? ?  ?  ? ?Robert Le, M.S., CCC-SLP ?Speech-Language Pathologist ?[facility] ?[phone] ([place])  ? ?Robert Le ? ?04/19/2021, 12:38 PM ?

## 2021-04-19 NOTE — Evaluation (Signed)
Occupational Therapy Evaluation ?Patient Details ?Name: Robert Le ?MRN: [id number] ?DOB: [birth year] ?Today's Date: 04/19/2021 ? ? ?History of Present Illness [name] is a 81 y.o. male w/ PMH dementia, murmur, anxiety and CKD. Pt presented via EMS from his ALF c/o AMS and malodorous urine. Pt was admitted for acute cholecystitis. General surgery found pt not to be a candidate for surgery and referred pt to IR for percutaneous cholecystostomy catheter drain placement.  ? ?Clinical Impression ?  ?[name] was seen for OT/PT co-evaluation this date. Prior to hospital admission, pt was resident at [facility] memory care unit. Pt unable to answer PLOF questions. Pt presents to acute OT demonstrating impaired ADL performance and functional mobility 2/2 decreased activity tolerance, poor command following, and functional ROM/balance deficits. Session limited by cognition this date. Pt states "where did you come from" and "I can go do it all" however does not respond to name or appear to understand questions. Largely not verbally responsive to questions.  ? ?Pt currently requires MAX A don/doff B socks at bed level. TOTAL A bed mobility. CGA static sitting balance however does not follow commands for seated grooming tasks. Pt would benefit from skilled OT to address noted impairments and functional limitations (see below for any additional details). Upon hospital discharge, recommend STR to maximize pt safety and return to PLOF. ?   ? ?Recommendations for follow up therapy are one component of a multi-disciplinary discharge planning process, led by the attending physician.  Recommendations may be updated based on patient status, additional functional criteria and insurance authorization.  ? ?Follow Up Recommendations ? Skilled nursing-short term rehab (<3 hours/day)  ?  ?Assistance Recommended at Discharge Frequent or constant Supervision/Assistance  ?Patient can return home with the following Two people to  help with walking and/or transfers;Two people to help with bathing/dressing/bathroom;Help with stairs or ramp for entrance;Assistance with feeding ? ?  ?Functional Status Assessment ? Patient has had a recent decline in their functional status and demonstrates the ability to make significant improvements in function in a reasonable and predictable amount of time.  ?Equipment Recommendations ? Hospital bed  ?  ?Recommendations for Other Services   ? ? ?  ?Precautions / Restrictions Precautions ?Precautions: Fall ?Restrictions ?Weight Bearing Restrictions: No  ? ?  ? ?Mobility Bed Mobility ?Overal bed mobility: Needs Assistance ?Bed Mobility: Supine to Sit, Sit to Supine ?  ?  ?Supine to sit: Total assist, +2 for physical assistance ?Sit to supine: Total assist, +2 for physical assistance ?  ?  ?  ? ?Transfers ?  ?  ?  ?  ?  ?  ?  ?  ?  ?General transfer comment: unsafe to attempt ?  ? ?  ?Balance Overall balance assessment: Needs assistance ?Sitting-balance support: Feet supported ?Sitting balance-Leahy Scale: Fair ?  ?  ?  ?  ?  ?  ?  ?  ?  ?  ?  ?  ?  ?  ?  ?  ?   ? ?ADL either performed or assessed with clinical judgement  ? ?ADL Overall ADL's : Needs assistance/impaired ?  ?  ?  ?  ?  ?  ?  ?  ?  ?  ?  ?  ?  ?  ?  ?  ?  ?  ?  ?General ADL Comments: MAX A don/doff B socks at bed level. CGA static sitting balance however does not follow commands for seated grooming tasks  ? ? ? ? ?  Pertinent Vitals/Pain Pain Assessment ?Pain Assessment: No/denies pain  ? ? ? ?Hand Dominance   ?  ?Extremity/Trunk Assessment Upper Extremity Assessment ?Upper Extremity Assessment: Overall WFL for tasks assessed;Difficult to assess due to impaired cognition (strong however does not move to follow commands or assist with mobility) ?  ?Lower Extremity Assessment ?Lower Extremity Assessment: Overall WFL for tasks assessed;Difficult to assess due to impaired cognition ?  ?Cervical / Trunk Assessment ?Cervical / Trunk Assessment: Normal ?   ?Communication Communication ?Communication: Other (comment) (cognition limiting - speaks clearly however answers not appropriate to questions. Does not respond to his name) ?  ?Cognition Arousal/Alertness: Awake/alert ?Behavior During Therapy: Flat affect ?Overall Cognitive Status: Difficult to assess ?  ?  ?  ?  ?  ?  ?  ?  ?  ?  ?  ?  ?  ?  ?  ?  ?General Comments: cognition limiting evaluation - dpes not respond to name. States "where did you come from" and "I can go do it all" ?  ?  ?   ?   ?   ? ? ?Home Living Family/patient expects to be discharged to:: Skilled nursing facility ?  ?  ?  ?  ?  ?  ?  ?  ?  ?  ?  ?  ?  ?  ?  ?  ?Additional Comments: [facility] Memory Care Unit ?  ? ?  ?Prior Functioning/Environment Prior Level of Function : Patient poor historian/Family not available ?  ?  ?  ?  ?  ?  ?Mobility Comments: per chart pt mobile at memory care unit using AD however pt unable to answer questions ?ADLs Comments: unsure ?  ? ?  ?  ?OT Problem List: Decreased activity tolerance;Impaired balance (sitting and/or standing);Decreased cognition;Decreased safety awareness ?  ?   ?OT Treatment/Interventions: Self-care/ADL training;Therapeutic exercise;Energy conservation;DME and/or AE instruction;Therapeutic activities;Patient/family education;Balance training  ?  ?OT Goals(Current goals can be found in the care plan section) Acute Rehab OT Goals ?Patient Stated Goal: none stated ?OT Goal Formulation: Patient unable to participate in goal setting ?Time For Goal Achievement: 05/03/21 ?Potential to Achieve Goals: Fair ?ADL Goals ?Pt Will Perform Grooming: with min assist;sitting ?Pt Will Perform Lower Body Dressing: sitting/lateral leans;with mod assist ?Pt Will Transfer to Toilet: with min assist (rolling at bed level)  ?OT Frequency: Min 1X/week ?  ? ?Co-evaluation PT/OT/SLP Co-Evaluation/Treatment: Yes ?Reason for Co-Treatment: For patient/therapist safety;To address functional/ADL transfers;Necessary to  address cognition/behavior during functional activity ?PT goals addressed during session: Mobility/safety with mobility;Balance ?OT goals addressed during session: ADL's and self-care ?  ? ?  ?AM-PAC OT "6 Clicks" Daily Activity     ?Outcome Measure Help from another person eating meals?: A Lot ?Help from another person taking care of personal grooming?: A Lot ?Help from another person toileting, which includes using toliet, bedpan, or urinal?: A Lot ?Help from another person bathing (including washing, rinsing, drying)?: A Lot ?Help from another person to put on and taking off regular upper body clothing?: A Lot ?Help from another person to put on and taking off regular lower body clothing?: A Lot ?6 Click Score: 12 ?  ?End of Session Nurse Communication: Mobility status ? ?Activity Tolerance: Other (comment) (limited by cognition) ?Patient left: in bed;with call bell/phone within reach;with bed alarm set ? ?OT Visit Diagnosis: Muscle weakness (generalized) (M62.81)  ?              ?Time: 1352-1401 ?OT Time Calculation (min):  9 min ?Charges:  OT General Charges ?$OT Visit: 1 Visit ?OT Evaluation ?$OT Eval Low Complexity: 1 Low ? ?Dessie Coma, M.S. OTR/L  ?04/19/21, 2:43 PM  ?ascom [phone] ? ?

## 2021-04-19 NOTE — Progress Notes (Signed)
04/19/2021 ? ?Subjective: ?Patient appears somewhat more awake this morning, able to interact a bit more with me when asking questions.  Denies any significant pain or nausea. ? ?Vital signs: ?Temp:  [97.5 ?F (36.4 ?C)-98.8 ?F (37.1 ?C)] 97.7 ?F (36.5 ?C) (03/20 2482) ?Pulse Rate:  [77-89] 77 (03/20 0824) ?Resp:  [16-20] 18 (03/20 0824) ?BP: (125-144)/(68-94) 128/72 (03/20 5003) ?SpO2:  [97 %-100 %] 98 % (03/20 0824) ?Weight:  [80 kg] 80 kg (03/20 0500)  ? ?Intake/Output: ?03/19 0701 - 03/20 0700 ?In: 98.4 [IV Piggyback:47.4] ?Out: 450 [Urine:400; Drains:50] ?Last BM Date :  (PTA) ? ?Physical Exam: ?Constitutional: No acute distress ?Abdomen:  soft, non-distended, does not appear tender to palpation.  RUQ gallbladder drain with bilious fluid with some blood component. ? ?Labs:  ?Recent Labs  ?  04/18/21 ?0401 04/19/21 ?0612  ?WBC 14.7* 8.1  ?HGB 13.8 13.8  ?HCT 42.2 42.3  ?PLT 219 230  ? ?Recent Labs  ?  04/18/21 ?0401 04/19/21 ?0612  ?NA 139 142  ?K 3.8 3.7  ?CL 108 108  ?CO2 23 28  ?GLUCOSE 114* 102*  ?BUN 21 23  ?CREATININE 1.07 0.96  ?CALCIUM 8.8* 8.6*  ? ?No results for input(s): LABPROT, INR in the last 72 hours. ? ?Imaging: ?No results found. ? ?Assessment/Plan: ?This is a 81 y.o. male with acute cholecystitis in setting of sepsis and altered mental status. ? ?--WBC normalized this morning, Hgb stable.  Yesterday SLP was not able to fully evaluate the patient due to his mental status.  Hopefully as today he seems more awake, he may be able to cooperate better if SLP team can follow him today again.  If he's approved for diet, could start clear liquids. ?--Continue IV abx.  No fevers, and has improved tachycardia. ?--Will continue to follow. ? ? ?I spent 25 minutes dedicated to the care of this patient on the date of this encounter to include pre-visit review of records, face-to-face time with the patient discussing diagnosis and management, and any post-visit coordination of care. ? ?Melvyn Neth,  MD ?[facility]  ?

## 2021-04-19 NOTE — Progress Notes (Signed)
?  Progress Note ? ? ?Patient: Robert Le QQV:[id number] DOB: 11/25/1940 DOA: 04/16/2021     2 ?DOS: the patient was seen and examined on 04/19/2021 ?  ?Brief hospital course: ?81 year old male with a history of dementia, lives at [facility] assisted living in the memory care unit, brought to the ER due to altered mental status. ? ?Assessment and Plan: ?* Acute cholecystitis ?--CT chest abdomen pelvis showed distended gallbladder with extensive periapical and cystic inflammatory stranding is mild pericholecystic fluid consistent with acute cholecystitis. ?--started on IV zosyn on admission ?General surgery consulted and rec perc cholecystostomy tube which was placed on 3/18 ?Plan: ?--cont IV zosyn for empiric coverage ?--drain management  ?--start clear liquid diet ? ?Acute metabolic encephalopathy ?related to acute illness.  More alert today ?--hold home gabapentin, Atarax and other sedating meds. ? ?Sepsis ([place]) ?--presented with fever, tachycardia and leukocytosis. ?Due to cholecystitis.  ? ? ?Alzheimer's dementia without behavioral disturbance ([place]) ?--resume home donepezil and memantine ?--resume home Cymbalta and Bupropion ? ?History of hypertension ?--does not appear to be a active problem now.  Not on home BP meds.  Inpatient BP wnl. ? ? ? ? ?  ? ?Subjective:  ?Pt "woke up" this morning, more alert, however, not oriented and confused.  No problem with swallowing. ? ? ?Physical Exam: ? ?Constitutional: NAD, AAOx3 ?HEENT: conjunctivae and lids normal, EOMI ?CV: No cyanosis.   ?RESP: normal respiratory effort ?GI: abdominal drain with dark red drainage ?Extremities: No effusions, edema in BLE, feet reddish in color but warm ?SKIN: warm, dry ? ? ?Data Reviewed: ? ?Family Communication:  ? ?Disposition: ?Status is: Inpatient ?Remains inpatient appropriate because: on IV abx ? ? Planned Discharge Destination:  [facility] assisted living in the memory care unit ? ? ? ?Time spent: 50 minutes ? ?Author: ?Enzo Bi,  MD ?04/19/2021 7:02 PM ? ?For on call review [email or web address].  ?

## 2021-04-20 ENCOUNTER — Inpatient Hospital Stay: Payer: Medicare Other

## 2021-04-20 DIAGNOSIS — K810 Acute cholecystitis: Secondary | ICD-10-CM | POA: Diagnosis not present

## 2021-04-20 DIAGNOSIS — A419 Sepsis, unspecified organism: Secondary | ICD-10-CM | POA: Diagnosis not present

## 2021-04-20 LAB — CBC
HCT: 41.7 % (ref 39.0–52.0)
Hemoglobin: 13.8 g/dL (ref 13.0–17.0)
MCH: 29.9 pg (ref 26.0–34.0)
MCHC: 33.1 g/dL (ref 30.0–36.0)
MCV: 90.5 fL (ref 80.0–100.0)
Platelets: 262 10*3/uL (ref 150–400)
RBC: 4.61 MIL/uL (ref 4.22–5.81)
RDW: 13.6 % (ref 11.5–15.5)
WBC: 8.5 10*3/uL (ref 4.0–10.5)
nRBC: 0 % (ref 0.0–0.2)

## 2021-04-20 LAB — BASIC METABOLIC PANEL
Anion gap: 8 (ref 5–15)
BUN: 20 mg/dL (ref 8–23)
CO2: 27 mmol/L (ref 22–32)
Calcium: 8.5 mg/dL — ABNORMAL LOW (ref 8.9–10.3)
Chloride: 107 mmol/L (ref 98–111)
Creatinine, Ser: 1.08 mg/dL (ref 0.61–1.24)
GFR, Estimated: >60 mL/min (ref >60)
Glucose, Bld: 109 mg/dL — ABNORMAL HIGH (ref 70–99)
Potassium: 3.2 mmol/L — ABNORMAL LOW (ref 3.5–5.1)
Sodium: 142 mmol/L (ref 135–145)

## 2021-04-20 LAB — MAGNESIUM: Magnesium: 2.1 mg/dL (ref 1.7–2.4)

## 2021-04-20 MED ORDER — AMOXICILLIN-POT CLAVULANATE 875-125 MG PO TABS: 1.0000 | ORAL_TABLET | Freq: Two times a day (BID) | ORAL | 0 refills | Status: AC

## 2021-04-20 MED ORDER — APIXABAN 5 MG PO TABS
5.0000 mg | ORAL_TABLET | Freq: Two times a day (BID) | ORAL | Status: DC
Start: 2021-04-27 — End: 2021-04-20

## 2021-04-20 MED ORDER — POTASSIUM CHLORIDE 10 MEQ/100ML IV SOLN
10.0000 meq | INTRAVENOUS | Status: AC
  Administered 2021-04-20 (×3): 10 meq via INTRAVENOUS
  Filled 2021-04-20 (×2): qty 100

## 2021-04-20 MED ORDER — APIXABAN 5 MG PO TABS
5.0000 mg | ORAL_TABLET | Freq: Two times a day (BID) | ORAL | Status: DC
Start: 2021-04-27 — End: 2021-12-18

## 2021-04-20 MED ORDER — APIXABAN 5 MG PO TABS: 10.0000 mg | ORAL_TABLET | Freq: Two times a day (BID) | ORAL | Status: DC

## 2021-04-20 MED ORDER — AMOXICILLIN-POT CLAVULANATE 875-125 MG PO TABS: 1.0000 | ORAL_TABLET | Freq: Two times a day (BID) | ORAL | Status: DC

## 2021-04-20 NOTE — Care Management Important Message (Signed)
Important Message ? ?Patient Details  ?Name: Robert Le ?MRN: [id number] ?Date of Birth: January 18, 1941 ? ? ?Medicare Important Message Given:  N/A - LOS <3 / Initial given by admissions ? ? ? ? ?[name] ?04/20/2021, 7:57 AM ?

## 2021-04-20 NOTE — Progress Notes (Signed)
Report called to [facility]. Made aware patient is on transport list with EMS ?

## 2021-04-20 NOTE — Discharge Summary (Addendum)
? ?Physician Discharge Summary ? ? ?Robert Le  male DOB: April 03, 1940  ?ELF:[id number] ? ?PCP: Venia Carbon, MD ? ?Admit date: 04/16/2021 ?Discharge date: 04/20/2021 ? ?Admitted From: Memory Care at [facility] ?Disposition:  Memory Care at [facility].  Of note, pt was essentially bed-bound and not able to follow commands and work with PT prior to discharge.  TOC checked with Memory Care at [facility] who accepts pt back and will transfer pt to SNF if needed. ? ?CODE STATUS: Full code ? ?Discharge Instructions   ? ? Discharge wound care:   Complete by: As directed ?  ? 54m saline flush through the drain once daily.  ?- ?-  ? ?  ? ?Hospital Course:  ?For full details, please see H&P, progress notes, consult notes and ancillary notes.  ?Briefly,  ?Robert SHANKARis a 81year old male with a history of dementia[facility] brought to the ER due to altered mental status. ? ?* Acute cholecystitis ?--CT chest abdomen pelvis showed distended gallbladder with extensive periapical and cystic inflammatory stranding is mild pericholecystic fluid consistent with acute cholecystitis. ?--started on IV zosyn on admission. ?General surgery consulted and rec perc cholecystostomy tube which was placed on 3/18.   ?--Pt received 4 days of IV zosyn for empiric coverage, and is discharged on 6 more days of Augmentin. ?--need 522msaline flush through the drain once daily. ?--outpatient f/u with GenSurg Dr. PiHampton Abbotn 3 weeks after discharge. ?  ?Acute metabolic encephalopathy ?related to acute illness.  Pt was somnolent for the first 2 days, but became alert and tolerating oral intake prior to discharge. ?--d/c'ed home gabapentin.  Atarax, Ativan, and trazodone were listed as "not taking PTA", so not continued.  Opioids pain meds not continued. ? ?Alzheimer's dementia without behavioral disturbance ([place]?--cont home donepezil and memantine ?--cont home Cymbalta and  Bupropion ? ?Sepsis ([place]?--presented with fever, tachycardia and leukocytosis.  Due to cholecystitis.  ?  ?Acute DVT of LLE ?--pt's left foot was found to be erythematous and edematous.  [place] left lower extremity positive for acute DVT, nonocclusive, of the common femoral vein, saphenofemoral junction, profunda vein. ?--Pt was started on Eliquis 10 mg BID for 7 days followed by 5 mg BID for at least 3 months. ? ?History of hypertension ?--does not appear to be a active problem now.  Not on home BP meds.  Inpatient BP wnl. ? ? ?Discharge Diagnoses:  ?Principal Problem: ?  Acute cholecystitis ?Active Problems: ?  Sepsis ([place]?  Acute metabolic encephalopathy ?  History of hypertension ?  Alzheimer's dementia without behavioral disturbance ([place]? ? ?30 Day Unplanned Readmission Risk Score   ? ?Flowsheet Row ED to Hosp-Admission (Current) [place]16/2021 in [place]?30 Day Unplanned Readmission Risk Score (%) 12.49 Filed at 04/20/2021 1200  ? ?  ? ? This score is the patient's risk of an unplanned readmission within 30 days of being discharged (0 -100%). The score is based on dignosis, age, lab data, medications, orders, and past utilization.   ?Low:  0-14.9   Medium: 15-21.9   High: 22-29.9   Extreme: 30 and above ? ?  ? ?  ? ? ?Discharge Instructions: ? ?Allergies as of 04/20/2021   ? ?   Reactions  ? Donepezil Other (See Comments)  ? Increased confusion  ? Sulfites   ? Unable to recall reaction or specific  sulfa drug  ? ?  ? ?  ?Medication List  ?  ? ?STOP taking these medications   ? ?amLODipine 5 MG tablet ?Commonly known as: NORVASC ?  ?cholecalciferol 1000 units tablet ?Commonly known as: VITAMIN D ?  ?gabapentin 100 MG capsule ?Commonly known as: NEURONTIN ?  ?gabapentin 600 MG tablet ?Commonly known as: NEURONTIN ?  ?HYDROcodone-acetaminophen 5-325 MG tablet ?Commonly known as: NORCO/VICODIN ?  ?hydrOXYzine 10 MG tablet ?Commonly known as: ATARAX ?   ?LORazepam 0.5 MG tablet ?Commonly known as: ATIVAN ?  ?traMADol 50 MG tablet ?Commonly known as: ULTRAM ?  ?traZODone 50 MG tablet ?Commonly known as: DESYREL ?  ? ?  ? ?TAKE these medications   ? ?acetaminophen 500 MG tablet ?Commonly known as: TYLENOL ?Take 500 mg by mouth 3 (three) times daily as needed for mild pain. ?  ?amoxicillin-clavulanate 875-125 MG tablet ?Commonly known as: AUGMENTIN ?Take 1 tablet by mouth every 12 (twelve) hours for 6 days. ?  ?apixaban 5 MG Tabs tablet ?Commonly known as: ELIQUIS ?Take 2 tablets (10 mg total) by mouth 2 (two) times daily for 7 days. ?  ?apixaban 5 MG Tabs tablet ?Commonly known as: ELIQUIS ?Take 1 tablet (5 mg total) by mouth 2 (two) times daily. ?Start taking on: April 27, 2021 ?  ?buPROPion ER 100 MG 12 hr tablet ?Commonly known as: WELLBUTRIN SR ?Take 100 mg by mouth 2 (two) times daily. ?  ?donepezil 5 MG tablet ?Commonly known as: ARICEPT ?Take 5 mg by mouth daily. ?  ?DULoxetine 30 MG capsule ?Commonly known as: CYMBALTA ?Take 30-60 mg by mouth 2 (two) times daily. 60 mg every morning and 30 mg daily at 4 pm ?  ?memantine 10 MG tablet ?Commonly known as: NAMENDA ?Take 10 mg by mouth 2 (two) times daily. ?What changed: Another medication with the same name was removed. Continue taking this medication, and follow the directions you see here. ?  ?multivitamin with minerals Tabs tablet ?Take 1 tablet by mouth daily. ?  ?polyethylene glycol 17 g packet ?Commonly known as: MIRALAX / GLYCOLAX ?Take 17 g by mouth every 3 (three) days. ?  ? ?  ? ?  ?  ? ? ?  ?Discharge Care Instructions  ?(From admission, onward)  ?  ? ? ?  ? ?  Start     Ordered  ? 04/20/21 0000  Discharge wound care:       ?Comments: 13m saline flush through the drain once daily.  ?- ?-  ? 04/20/21 1250  ? ?  ?  ? ?  ? ? ? Follow-up Information   ? ? POlean Ree MD Follow up in 3 week(s).   ?Specialty: General Surgery ?Contact information: ?[address] ?[place] [address]?[phone]? ? ?  ?  ? ? LVenia Carbon MD Follow up in 1 week(s).   ?Specialties: Internal Medicine, Pediatrics ?Contact information: ?[id number]?[phone]? ? ?  ?  ? ?  ?  ? ?  ? ? ?Allergies  ?Allergen Reactions  ? Donepezil Other (See Comments)  ?  Increased confusion  ? Sulfites   ?  Unable to recall reaction or specific sulfa drug  ? ? ? ?The results of significant diagnostics from this hospitalization (including imaging, microbiology, ancillary and laboratory) are listed below for reference.  ? ?Consultations: ? ? ?Procedures/Studies: ?CT Head Wo Contrast ? ?Result Date: 04/17/2021 ?CLINICAL DATA:  Delirium EXAM: CT  HEAD WITHOUT CONTRAST TECHNIQUE: Contiguous axial images were obtained from the base of the skull through the vertex without intravenous contrast. RADIATION DOSE REDUCTION: This exam was performed according to the departmental dose-optimization program which includes automated exposure control, adjustment of the mA and/or kV according to patient size and/or use of iterative reconstruction technique. COMPARISON:  CT head 08/31/2013 BRAIN: BRAIN Cerebral ventricle sizes are concordant with the degree of cerebral volume loss. Patchy and confluent areas of decreased attenuation are noted throughout the deep and periventricular white matter of the cerebral hemispheres bilaterally, compatible with chronic microvascular ischemic disease. No evidence of large-territorial acute infarction. No parenchymal hemorrhage. No mass lesion. No extra-axial collection. No mass effect or midline shift. No hydrocephalus. Basilar cisterns are patent. Vascular: No hyperdense vessel. Skull: No acute fracture or focal lesion. Sinuses/Orbits: Paranasal sinuses and mastoid air cells are clear. The orbits are unremarkable. Other: None. IMPRESSION: No acute intracranial abnormality. Electronically Signed   By: Iven Finn M.D.   On: 04/17/2021 00:37  ? ?CT CHEST ABDOMEN  PELVIS W CONTRAST ? ?Result Date: 04/17/2021 ?CLINICAL DATA:  Fever unknown origin, neutropenia, sepsis EXAM: CT CHEST, ABDOMEN, AND PELVIS WITH CONTRAST TECHNIQUE: Multidetector CT imaging of the chest, abdomen and pelvis was perform

## 2021-04-20 NOTE — Progress Notes (Signed)
Patient left in stable condition via EMS to [facility] memory care ?

## 2021-04-20 NOTE — Progress Notes (Signed)
CC: Cholecystitis ?Subjective: ?In good spirits, no complaints this morning.  Did have some delirium last night ?Negative cultures so far ?Afebrile ?Objective: ?Vital signs in last 24 hours: ?Temp:  [97 ?F (36.1 ?C)-98.4 ?F (36.9 ?C)] 98.4 ?F (36.9 ?C) (03/21 5400) ?Pulse Rate:  [59-93] 72 (03/21 0925) ?Resp:  [16-18] 18 (03/21 0925) ?BP: (112-133)/(55-80) 128/70 (03/21 0925) ?SpO2:  [93 %-100 %] 96 % (03/21 0925) ?Weight:  [81 kg] 81 kg (03/21 0435) ?Last BM Date :  (unknown) ? ?Intake/Output from previous day: ?03/20 0701 - 03/21 0700 ?In: 886.9 [P.O.:390; I.V.:391.9; IV Piggyback:100] ?Out: 867 [Urine:650; Drains:85] ?Intake/Output this shift: ?Total I/O ?In: -  ?Out: 1200 [Urine:1200] ? ?Physical exam: ?NAD, alert ?Abd: soft, nt, cholecystostomy tube in place w bile, no peritonitis, no infection ?Ext: well perfused ? ? ?Lab Results: ?CBC  ?Recent Labs  ?  04/19/21 ?0612 04/20/21 ?0522  ?WBC 8.1 8.5  ?HGB 13.8 13.8  ?HCT 42.3 41.7  ?PLT 230 262  ? ?BMET ?Recent Labs  ?  04/19/21 ?0612 04/20/21 ?0522  ?NA 142 142  ?K 3.7 3.2*  ?CL 108 107  ?CO2 28 27  ?GLUCOSE 102* 109*  ?BUN 23 20  ?CREATININE 0.96 1.08  ?CALCIUM 8.6* 8.5*  ? ?PT/INR ?No results for input(s): LABPROT, INR in the last 72 hours. ?ABG ?No results for input(s): PHART, HCO3 in the last 72 hours. ? ?Invalid input(s): PCO2, PO2 ? ?Studies/Results: ?US Venous Img Lower Unilateral Left (DVT) ? ?Result Date: 04/20/2021 ?CLINICAL DATA:  81 year old male with a history of cholecystitis, leg swelling EXAM: LEFT LOWER EXTREMITY VENOUS DOPPLER ULTRASOUND TECHNIQUE: Gray-scale sonography with graded compression, as well as color Doppler and duplex ultrasound were performed to evaluate the lower extremity deep venous systems from the level of the common femoral vein and including the common femoral, femoral, profunda femoral, popliteal and calf veins including the posterior tibial, peroneal and gastrocnemius veins when visible. The superficial great saphenous vein  was also interrogated. Spectral Doppler was utilized to evaluate flow at rest and with distal augmentation maneuvers in the common femoral, femoral and popliteal veins. COMPARISON:  None. FINDINGS: Contralateral Common Femoral Vein: Respiratory phasicity is normal and symmetric with the symptomatic side. No evidence of thrombus. Normal compressibility. Common Femoral Vein: Thrombus within the common femoral vein with incomplete compressibility. Marginal flow maintained. Saphenofemoral Junction: Nonocclusive thrombus at the saphenofemoral junction with incomplete compressibility. Profunda Femoral Vein: Incompletely compressible profunda vein with nonocclusive thrombus. Femoral Vein: No evidence of thrombus. Normal compressibility, respiratory phasicity and response to augmentation. Popliteal Vein: No evidence of thrombus. Normal compressibility, respiratory phasicity and response to augmentation. Calf Veins: No evidence of thrombus. Normal compressibility and flow on color Doppler imaging. Superficial Great Saphenous Vein: No evidence of thrombus. Normal compressibility and flow on color Doppler imaging. Other Findings:  None. IMPRESSION: Directed duplex left lower extremity positive for acute DVT, nonocclusive, of the common femoral vein, saphenofemoral junction, profunda vein. Electronically Signed   By: Corrie Mckusick D.O.   On: 04/20/2021 09:28   ? ?Anti-infectives: ?Anti-infectives (From admission, onward)  ? ? Start     Dose/Rate Route Frequency Ordered Stop  ? 04/17/21 0445  piperacillin-tazobactam (ZOSYN) IVPB 3.375 g       ? 3.375 g ?12.5 mL/hr over 240 Minutes Intravenous Every 8 hours 04/17/21 0433    ? 04/17/21 0100  vancomycin (VANCOREADY) IVPB 2000 mg/400 mL       ? 2,000 mg ?200 mL/hr over 120 Minutes Intravenous  Once 04/17/21 0049 04/17/21  2197  ? 04/17/21 0015  vancomycin (VANCOCIN) IVPB 1000 mg/200 mL premix  Status:  Discontinued       ? 1,000 mg ?200 mL/hr over 60 Minutes Intravenous  Once  04/17/21 0001 04/17/21 0048  ? 04/17/21 0015  ceFEPIme (MAXIPIME) 2 g in sodium chloride 0.9 % 100 mL IVPB       ? 2 g ?200 mL/hr over 30 Minutes Intravenous  Once 04/17/21 0001 04/17/21 0107  ? ?  ? ? ?Assessment/Plan: ? ?Cholecystitis on patient that is very frail with dementia and significant medical issues. ?He is doing well with cholecystostomy and antibiotics. ?May advance diet ?May discharge home per primary team.  Continue outpatient to biotic therapy for an additional 7 days. ?No need for surgical intervention at this time ?We will follow as outpt ? ?Caroleen Hamman, MD, FACS ? ?04/20/2021 ? ? ? ?  ?

## 2021-04-20 NOTE — Progress Notes (Signed)
Speech Language Pathology Treatment: Dysphagia  ?Patient Details ?Name: Robert Le ?MRN: [id number] ?DOB: 01-24-1941 ?Today's Date: 04/20/2021 ?Time: 3335-4562 ?SLP Time Calculation (min) (ACUTE ONLY): 45 min ? ?Assessment / Plan / Recommendation ?Clinical Impression ? Pt continues to present w/ grossly functional oropharyngeal phase swallow function w/ the upgrade to soft solid(cut) foods in his diet, however, he is SIGNIFICANTLY impacted in awareness and attention during oral intake tasks by Baseline Dementia/Cognitive decline. The Cognitive decline impacts his overall awareness/engagement w/ po tasks which can increase risk for aspiration, choking. This risk is reduced when following general aspiration precautions, by using a modified diet(slightly w/ moist/cooked foods), and when supported during the meal w/ tray setup and feeding support. During this session, pt did not initiate any self-feeding despite support/cues. Unsure of his Baseline engagement w/ this task at the [facility]. ? ?During this tx session, he required MOD-MAX verbal/tactile/visual cues for orientation to bolus presentation, oral prep stage, and follow-through w/ next task. REDUCE DISTRACTIONS strategy was utilized in order to support pt's attention during po trials. Pt kept his eyes closed frequently during the session. ?Pt was positioned upright in bed(MAX support) then fed trials of thin liquids via cup/straw, purees, and soft solids w/ No overt clinical s/s of aspiration noted; no decline in phonations, no cough, and no decline in respiratory status during/post trials. Oral phase was c/b methodical mastication and oral movements in general -- suspect related to Baseline Cognitive decline/Dementia. He exhibited functional bolus management, mastication, and oral clearing of the boluses given; slight increased Time noted as he used lengthy lingual sweeping to clear orally b/t bites. Sips of thin liquids were encouraged to  Alternate bites/sips to aid clearing, and for hydration in general. Tactile cues of spoon at lips strategy was utilized d/t pt's closed eyes status. ?  ?Recommend upgrade to a Dysphagia level 3(mech soft) w/ Thin liquids; general aspiration precautions including straw use and pinch straw to monitor amount/bolus; reduce Distractions during meals. Feeding Support at all meals; Cues. Pills Crushed in Puree for safer swallowing. Support at all meals d/t Cognitive decline/Dementia. Recommend Dietician f/u for support. Discussed diet consistency of foods/thin liquids; aspiration precautions; pills Crushed in puree; feeding support and supervision at meals w/ NSG. Handouts posted in room, chart.   ?No further skilled ST services indicated as pt appears most supported w/ this diet consistency in setting of Baseline Cognitive decline/Dementia and need for MOD-MAX cues/Supervision during oral intake. NSG/MD updated.  ? ? ? ?  ?HPI HPI: Per H&P "81 year old male with a history of dementia, lives at [facility] assisted living in the memory care unit, brought to the ER due to altered mental status and suspected UTI.  Patient unable to give history due to altered mental status." Pt s/p percutaneous cholecystostomy drain placement on 04/17/21. ?  ?   ?SLP Plan ? All goals met ? ?  ?  ?Recommendations for follow up therapy are one component of a multi-disciplinary discharge planning process, led by the attending physician.  Recommendations may be updated based on patient status, additional functional criteria and insurance authorization. ?  ? ?Recommendations  ?Diet recommendations: Dysphagia 3 (mechanical soft);Thin liquid (unable to cut his own foods; methodical mastication effort) ?Liquids provided via: Cup;Straw (monitor) ?Medication Administration: Crushed with puree (for safer swallowing) ?Supervision: Staff to assist with self feeding;Full supervision/cueing for compensatory strategies ?Compensations: Minimize environmental  distractions;Slow rate;Small sips/bites;Lingual sweep for clearance of pocketing;Follow solids with liquid ?Postural Changes and/or Swallow Maneuvers: Seated upright 90  degrees;Out of bed for meals;Upright 30-60 min after meal  ?   ?    ?   ? ? ? ? General recommendations:  (Dietician f/u; Palliative Care f/u for [place] d/t Baseline Dementia) ?Oral Care Recommendations: Oral care BID;Oral care before and after PO;Staff/trained caregiver to provide oral care ?Follow Up Recommendations: No SLP follow up ?Assistance recommended at discharge: Frequent or constant Supervision/Assistance ?SLP Visit Diagnosis: Dysphagia, unspecified (R13.10) (w/ impact from Dementia/Cognitive decline) ?Plan: All goals met ? ? ? ? ?  ?  ? ? ? ? ? ? ? ?Orinda Kenner, MS, CCC-SLP ?Speech Language Pathologist ?[facility]; [place] ?[phone] (ascom) ? ?Robert Le ? ?04/20/2021, 1:04 PM ?

## 2021-04-20 NOTE — TOC Progression Note (Signed)
Transition of Care (TOC) - Progression Note  ? ? ?Patient Details  ?Name: Robert Le ?MRN: [id number] ?Date of Birth: 20-Aug-1940 ? ?Transition of Care (TOC) CM/SW Contact  ?Pete Pelt, RN ?Phone Number: ?04/20/2021, 1:19 PM ? ?Clinical Narrative:   Patient will transfer back to [facility] today.  Facility states they will settle him in and if he needs a higher level of care, they will transfer patient within facility, but [facility] feels patient will improve when he is in his environment. ? ?EMS states patient is 5th on list, care team and son are aware of transfer as well. ? ? ? ? ?  ?  ? ?Expected Discharge Plan and Services ?  ?  ?  ?  ?  ?Expected Discharge Date: 04/20/21               ?  ?  ?  ?  ?  ?  ?  ?  ?  ?  ? ? ?Social Determinants of Health (SDOH) Interventions ?  ? ?Readmission Risk Interventions ?No flowsheet data found. ? ?

## 2021-04-20 NOTE — Progress Notes (Signed)
? ?      CROSS COVER NOTE ? ?NAME: Robert Le ?MRN: [id number] ?DOB : 1940-09-24 ? ?Secure chat received from nursing reporting erythema and edema of (L) foot that has worsened overnight. ? ?On my bedside evaluation (L) foot is erythematous and edematous compared to (R) foot and warmer to the touch. +2 DP pulses bilaterally. Unable to complete a ROS on patient 2/2 mental status. Patient does not exhibit physiological or behavioral signs of pain when the (L) foot is touched/examined. See media tab for images. ? ?Plan: ? ?DVT vs Cellulitis vs Venous Insufficiency ? ?- [place] (L)LE ordered ?- Consider Abx coverage for cellulitis once [place] results ?

## 2021-04-20 NOTE — Progress Notes (Signed)
Patient off floor in stable condition to ultrasound ?

## 2021-04-21 DIAGNOSIS — K812 Acute cholecystitis with chronic cholecystitis: Secondary | ICD-10-CM

## 2021-04-21 DIAGNOSIS — I82402 Acute embolism and thrombosis of unspecified deep veins of left lower extremity: Secondary | ICD-10-CM

## 2021-04-21 DIAGNOSIS — G9341 Metabolic encephalopathy: Secondary | ICD-10-CM

## 2021-04-21 LAB — CULTURE, BLOOD (ROUTINE X 2)
Culture: NO GROWTH
Culture: NO GROWTH
Special Requests: ADEQUATE
Special Requests: ADEQUATE

## 2021-04-26 DIAGNOSIS — K810 Acute cholecystitis: Secondary | ICD-10-CM | POA: Diagnosis not present

## 2021-04-29 DIAGNOSIS — M6281 Muscle weakness (generalized): Secondary | ICD-10-CM | POA: Insufficient documentation

## 2021-05-12 ENCOUNTER — Ambulatory Visit (INDEPENDENT_AMBULATORY_CARE_PROVIDER_SITE_OTHER): Payer: Medicare Other | Admitting: Surgery

## 2021-05-12 ENCOUNTER — Encounter: Payer: Self-pay | Admitting: Surgery

## 2021-05-12 VITALS — BP 140/86 | HR 81 | Temp 98.8°F | Wt 155.0 lb

## 2021-05-12 DIAGNOSIS — K810 Acute cholecystitis: Secondary | ICD-10-CM

## 2021-05-12 NOTE — Patient Instructions (Addendum)
Follow up here in June with Dr Hampton Abbot. We will discuss surgery scheduling then.  ? ?We have scheduled you for a Cholecystostomy tube check at [facility] [place] on 06/10/21.  ?You will need to arrive there by 9:30 am. ?  ?

## 2021-05-12 NOTE — Progress Notes (Signed)
?05/12/2021 ? ?History of Present Illness: ?Robert Le is a 81 y.o. male presenting for follow up of acute cholecystitis.  He was admitted on 04/16/21 with cholecystitis, worsened altered mental status, concern for sepsis.  He had a percutaneous cholecystostomy drain placed on 04/17/21.  He did have a hospital complication of a non-obstructing left lower extremity DVT and was placed on Eliquis.  He was discharged on 04/20/21 and is now back at [facility].   ? ?Appears that he has been doing well, denies any nausea/vomiting or abdominal pain, but his history is unclear given his dementia.  Drain is bilious. ? ?Past Medical History: ?Past Medical History:  ?Diagnosis Date  ? Alzheimer disease ([place])   ? Anxiety   ? Arthritis   ? Chronic kidney disease   ? L TOTAL NEPHRECTOMY  ? Hypertension   ?  ? ?Past Surgical History: ?Past Surgical History:  ?Procedure Laterality Date  ? FRACTURE SURGERY    ? [place]  ? IR PERC CHOLECYSTOSTOMY  04/17/2021  ? 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 TOTAL HIP ARTHROPLASTY Left 08/20/2013  ? Procedure: LEFT TOTAL HIP ARTHROPLASTY ANTERIOR APPROACH;  Surgeon: Mauri Pole, MD;  Location: [facility] ORS;  Service: Orthopedics;  Laterality: Left;  ? ? ?Home Medications: ?Prior to Admission medications   ?Medication Sig Start Date End Date Taking? Authorizing Provider  ?acetaminophen (TYLENOL) 500 MG tablet Take 500 mg by mouth 3 (three) times daily as needed for mild pain.   Yes [provider]  ?apixaban (ELIQUIS) 5 MG TABS tablet Take 1 tablet (5 mg total) by mouth 2 (two) times daily. 04/27/21 07/26/21 Yes Enzo Bi, MD  ?buPROPion [facility] SR) 100 MG 12 hr tablet Take 100 mg by mouth 2 (two) times daily. 02/23/21  Yes [provider]  ?donepezil (ARICEPT) 5 MG tablet Take 5 mg by mouth daily. 02/23/21  Yes [provider]  ?DULoxetine (CYMBALTA) 30 MG capsule Take 30-60 mg by mouth 2 (two) times daily. 60 mg every morning  and 30 mg daily at 4 pm   Yes [provider]  ?gabapentin (NEURONTIN) 300 MG capsule Take 300 mg by mouth 2 (two) times daily. 03/25/21  Yes [provider]  ?HYDROcodone-acetaminophen (NORCO/VICODIN) 5-325 MG tablet Take 1 tablet by mouth every 6 (six) hours as needed for moderate pain.   Yes [provider]  ?memantine (NAMENDA) 10 MG tablet Take 10 mg by mouth 2 (two) times daily. 02/23/21  Yes [provider]  ?Multiple Vitamin (MULTIVITAMIN WITH MINERALS) TABS tablet Take 1 tablet by mouth daily. 05/05/16  Yes Venia Carbon, MD  ?polyethylene glycol (MIRALAX / GLYCOLAX) 17 g packet Take 17 g by mouth every 3 (three) days.   Yes [provider]  ?apixaban (ELIQUIS) 5 MG TABS tablet Take 2 tablets (10 mg total) by mouth 2 (two) times daily for 7 days. 04/20/21 04/27/21  Enzo Bi, MD  ? ? ?Allergies: ?Allergies  ?Allergen Reactions  ? Donepezil Other (See Comments)  ?  Increased confusion  ? Sulfites   ?  Unable to recall reaction or specific sulfa drug  ? ? ?Review of Systems: ?Review of Systems  ?Unable to perform ROS: Dementia  ? ?Physical Exam ?BP 140/86   Pulse 81   Temp 98.8 ?F (37.1 ?C)   Wt 155 lb (70.3 kg)   SpO2 93%   BMI 24.28 kg/m?  ?CONSTITUTIONAL: No acute distress, well nourished. ?  HEENT:  Normocephalic, atraumatic, extraocular motion intact. ?RESPIRATORY:  Mild coarse sounds at the bases.  Normal respiratory effort without pathologic use of accessory muscles. ?CARDIOVASCULAR: Heart is regular without murmurs, gallops, or rubs. ?GI: The abdomen is soft, non-distended, non-tender.  RUQ drain with bilious fluid. ?NEUROLOGIC:  Motor and sensation is grossly normal.  Cranial nerves are grossly intact. ?PSYCH:  Alert and oriented to person, place and time. Affect is normal. ? ? ?Assessment and Plan: ?This is a 81 y.o. male with cholecystitis s/p perc chole drain. ? ?--Patient is doing much better today, looks stronger and in better spirits.  Although his  alzheimer's is evident, he is more conversational today compared to when he was in the hospital.   ?--Discussed with him and the person from [facility] that came with him that for now would continue with flushing the drain once daily and diet as tolerated.  Given his DVT, he should be on Eliquis for three months without interruption.  In the meantime, will order a cholangiogram to evaluate his gallbladder and biliary anatomy to be done in about a month.  Then he will follow up with me early June to discuss surgery and that way we can stop the Eliquis for surgery. ?--Follow up in June. ? ?I spent 25 minutes dedicated to the care of this patient on the date of this encounter to include pre-visit review of records, face-to-face time with the patient discussing diagnosis and management, and any post-visit coordination of care. ? ? ?Melvyn Neth, MD ?[facility] ? ? ?  ?

## 2021-05-17 DIAGNOSIS — G301 Alzheimer's disease with late onset: Secondary | ICD-10-CM | POA: Diagnosis not present

## 2021-05-17 DIAGNOSIS — F39 Unspecified mood [affective] disorder: Secondary | ICD-10-CM | POA: Diagnosis not present

## 2021-05-17 DIAGNOSIS — K810 Acute cholecystitis: Secondary | ICD-10-CM | POA: Diagnosis not present

## 2021-05-17 DIAGNOSIS — I471 Supraventricular tachycardia: Secondary | ICD-10-CM

## 2021-05-17 DIAGNOSIS — M5416 Radiculopathy, lumbar region: Secondary | ICD-10-CM | POA: Diagnosis not present

## 2021-06-04 ENCOUNTER — Other Ambulatory Visit: Payer: Self-pay

## 2021-06-04 DIAGNOSIS — K810 Acute cholecystitis: Secondary | ICD-10-CM

## 2021-06-04 NOTE — Progress Notes (Signed)
Patient on schedule for chole tube check/exchange 5/11, however [facility] calling stating patient has bleeding/pain at site, has been picking and pulling at tube, consulted with Radiology PA [facility], instructing [facility] to get abdominal binder for patient as well as monitor tube for now, as it is still draining adequately, no fever at this time. Will plan to reasess on Monday further if needed to bring patient in before already  scheduled time next week, to exchange tube.  ?

## 2021-06-10 ENCOUNTER — Ambulatory Visit
Admission: RE | Admit: 2021-06-10 | Discharge: 2021-06-10 | Disposition: A | Discharge status: Home / Self Care | Payer: Medicare Other | Source: Ambulatory Visit | Attending: Surgery | Admitting: Surgery

## 2021-06-10 ENCOUNTER — Other Ambulatory Visit: Payer: Self-pay | Admitting: Interventional Radiology

## 2021-06-10 ENCOUNTER — Other Ambulatory Visit: Payer: Self-pay | Admitting: Surgery

## 2021-06-10 DIAGNOSIS — K810 Acute cholecystitis: Secondary | ICD-10-CM

## 2021-06-10 HISTORY — PX: IR EXCHANGE BILIARY DRAIN: IMG6046

## 2021-06-10 MED ORDER — LIDOCAINE HCL 1 % IJ SOLN
INTRAMUSCULAR | Status: AC
  Administered 2021-06-10: 1 mL
  Filled 2021-06-10: qty 20

## 2021-06-10 MED ORDER — IOHEXOL 350 MG/ML SOLN
5.0000 mL | Freq: Once | INTRAVENOUS | Status: DC | PRN
  Filled 2021-06-10: qty 5

## 2021-06-10 NOTE — Progress Notes (Signed)
Spoke with RN Manuela Schwartz at [facility] 06/10/21 @ 10:30 am. Relayed that patient's chole tube has been capped and should be left that way unless patient experiences pain or leaking from around the tube. Instructed to place drain to bag if either of those situations should arise. RN verbalized understanding. ?

## 2021-06-14 ENCOUNTER — Emergency Department: Payer: Medicare Other

## 2021-06-14 ENCOUNTER — Encounter: Payer: Self-pay | Admitting: Internal Medicine

## 2021-06-14 ENCOUNTER — Inpatient Hospital Stay
Admission: EM | Admit: 2021-06-14 | Discharge: 2021-06-18 | DRG: 871 | Disposition: A | Discharge status: Nursing Home (Includes ICF) | Payer: Medicare Other | Source: Skilled Nursing Facility | Attending: Internal Medicine | Admitting: Internal Medicine

## 2021-06-14 DIAGNOSIS — Z79899 Other long term (current) drug therapy: Secondary | ICD-10-CM | POA: Diagnosis not present

## 2021-06-14 DIAGNOSIS — Z8249 Family history of ischemic heart disease and other diseases of the circulatory system: Secondary | ICD-10-CM | POA: Diagnosis not present

## 2021-06-14 DIAGNOSIS — A419 Sepsis, unspecified organism: Secondary | ICD-10-CM | POA: Diagnosis present

## 2021-06-14 DIAGNOSIS — K810 Acute cholecystitis: Secondary | ICD-10-CM | POA: Diagnosis present

## 2021-06-14 DIAGNOSIS — R64 Cachexia: Secondary | ICD-10-CM | POA: Diagnosis present

## 2021-06-14 DIAGNOSIS — R159 Full incontinence of feces: Secondary | ICD-10-CM | POA: Diagnosis present

## 2021-06-14 DIAGNOSIS — I129 Hypertensive chronic kidney disease with stage 1 through stage 4 chronic kidney disease, or unspecified chronic kidney disease: Secondary | ICD-10-CM | POA: Diagnosis present

## 2021-06-14 DIAGNOSIS — Z905 Acquired absence of kidney: Secondary | ICD-10-CM | POA: Diagnosis not present

## 2021-06-14 DIAGNOSIS — R400 Somnolence: Secondary | ICD-10-CM | POA: Diagnosis not present

## 2021-06-14 DIAGNOSIS — R7881 Bacteremia: Secondary | ICD-10-CM | POA: Diagnosis not present

## 2021-06-14 DIAGNOSIS — R32 Unspecified urinary incontinence: Secondary | ICD-10-CM | POA: Diagnosis present

## 2021-06-14 DIAGNOSIS — Z7901 Long term (current) use of anticoagulants: Secondary | ICD-10-CM

## 2021-06-14 DIAGNOSIS — I700 Atherosclerosis of aorta: Secondary | ICD-10-CM | POA: Diagnosis present

## 2021-06-14 DIAGNOSIS — Z96642 Presence of left artificial hip joint: Secondary | ICD-10-CM | POA: Diagnosis present

## 2021-06-14 DIAGNOSIS — F419 Anxiety disorder, unspecified: Secondary | ICD-10-CM | POA: Diagnosis present

## 2021-06-14 DIAGNOSIS — G309 Alzheimer's disease, unspecified: Secondary | ICD-10-CM | POA: Diagnosis present

## 2021-06-14 DIAGNOSIS — A4151 Sepsis due to Escherichia coli [E. coli]: Secondary | ICD-10-CM | POA: Diagnosis present

## 2021-06-14 DIAGNOSIS — Z86718 Personal history of other venous thrombosis and embolism: Secondary | ICD-10-CM | POA: Diagnosis not present

## 2021-06-14 DIAGNOSIS — Z7189 Other specified counseling: Secondary | ICD-10-CM | POA: Diagnosis not present

## 2021-06-14 DIAGNOSIS — K8309 Other cholangitis: Secondary | ICD-10-CM | POA: Diagnosis present

## 2021-06-14 DIAGNOSIS — Z6825 Body mass index (BMI) 25.0-25.9, adult: Secondary | ICD-10-CM | POA: Diagnosis not present

## 2021-06-14 DIAGNOSIS — N189 Chronic kidney disease, unspecified: Secondary | ICD-10-CM | POA: Diagnosis present

## 2021-06-14 DIAGNOSIS — F0280 Dementia in other diseases classified elsewhere without behavioral disturbance: Secondary | ICD-10-CM | POA: Diagnosis present

## 2021-06-14 DIAGNOSIS — R4182 Altered mental status, unspecified: Secondary | ICD-10-CM | POA: Diagnosis present

## 2021-06-14 DIAGNOSIS — G9341 Metabolic encephalopathy: Secondary | ICD-10-CM | POA: Diagnosis present

## 2021-06-14 DIAGNOSIS — B9620 Unspecified Escherichia coli [E. coli] as the cause of diseases classified elsewhere: Secondary | ICD-10-CM | POA: Diagnosis not present

## 2021-06-14 DIAGNOSIS — F03C0 Unspecified dementia, severe, without behavioral disturbance, psychotic disturbance, mood disturbance, and anxiety: Secondary | ICD-10-CM | POA: Diagnosis present

## 2021-06-14 LAB — CBC WITH DIFFERENTIAL/PLATELET
Abs Immature Granulocytes: 0.1 10*3/uL — ABNORMAL HIGH (ref 0.00–0.07)
Basophils Absolute: 0.1 10*3/uL (ref 0.0–0.1)
Basophils Relative: 0 %
Eosinophils Absolute: 0 10*3/uL (ref 0.0–0.5)
Eosinophils Relative: 0 %
HCT: 48 % (ref 39.0–52.0)
Hemoglobin: 15.5 g/dL (ref 13.0–17.0)
Immature Granulocytes: 1 %
Lymphocytes Relative: 2 %
Lymphs Abs: 0.4 10*3/uL — ABNORMAL LOW (ref 0.7–4.0)
MCH: 29.6 pg (ref 26.0–34.0)
MCHC: 32.3 g/dL (ref 30.0–36.0)
MCV: 91.6 fL (ref 80.0–100.0)
Monocytes Absolute: 0.8 10*3/uL (ref 0.1–1.0)
Monocytes Relative: 5 %
Neutro Abs: 14.6 10*3/uL — ABNORMAL HIGH (ref 1.7–7.7)
Neutrophils Relative %: 92 %
Platelets: 196 10*3/uL (ref 150–400)
RBC: 5.24 MIL/uL (ref 4.22–5.81)
RDW: 14.6 % (ref 11.5–15.5)
WBC: 15.9 10*3/uL — ABNORMAL HIGH (ref 4.0–10.5)
nRBC: 0 % (ref 0.0–0.2)

## 2021-06-14 LAB — URINALYSIS, COMPLETE (UACMP) WITH MICROSCOPIC
Bacteria, UA: NONE SEEN
Bilirubin Urine: NEGATIVE
Glucose, UA: NEGATIVE mg/dL
Hgb urine dipstick: NEGATIVE
Ketones, ur: 20 mg/dL — AB
Leukocytes,Ua: NEGATIVE
Nitrite: NEGATIVE
Protein, ur: NEGATIVE mg/dL
Specific Gravity, Urine: 1.02 (ref 1.005–1.030)
pH: 6 (ref 5.0–8.0)

## 2021-06-14 LAB — COMPREHENSIVE METABOLIC PANEL
ALT: 179 U/L — ABNORMAL HIGH (ref 0–44)
AST: 169 U/L — ABNORMAL HIGH (ref 15–41)
Albumin: 3.6 g/dL (ref 3.5–5.0)
Alkaline Phosphatase: 117 U/L (ref 38–126)
Anion gap: 9 (ref 5–15)
BUN: 18 mg/dL (ref 8–23)
CO2: 27 mmol/L (ref 22–32)
Calcium: 9.1 mg/dL (ref 8.9–10.3)
Chloride: 105 mmol/L (ref 98–111)
Creatinine, Ser: 1.08 mg/dL (ref 0.61–1.24)
GFR, Estimated: >60 mL/min (ref >60)
Glucose, Bld: 115 mg/dL — ABNORMAL HIGH (ref 70–99)
Potassium: 3.9 mmol/L (ref 3.5–5.1)
Sodium: 141 mmol/L (ref 135–145)
Total Bilirubin: 1 mg/dL (ref 0.3–1.2)
Total Protein: 6.2 g/dL — ABNORMAL LOW (ref 6.5–8.1)

## 2021-06-14 LAB — PROTIME-INR
INR: 1.2 (ref 0.8–1.2)
Prothrombin Time: 15.1 seconds (ref 11.4–15.2)

## 2021-06-14 LAB — LACTIC ACID, PLASMA
Lactic Acid, Venous: 1.9 mmol/L (ref 0.5–1.9)
Lactic Acid, Venous: 1.3 mmol/L (ref 0.5–1.9)

## 2021-06-14 LAB — APTT: aPTT: 31 seconds (ref 24–36)

## 2021-06-14 LAB — LIPASE, BLOOD: Lipase: 25 U/L (ref 11–51)

## 2021-06-14 MED ORDER — DONEPEZIL HCL 5 MG PO TABS
5.0000 mg | ORAL_TABLET | Freq: Every day | ORAL | Status: DC
  Administered 2021-06-15 – 2021-06-18 (×4): 5 mg via ORAL
  Filled 2021-06-14 (×4): qty 1

## 2021-06-14 MED ORDER — MEMANTINE HCL 5 MG PO TABS
10.0000 mg | ORAL_TABLET | Freq: Two times a day (BID) | ORAL | Status: DC
  Administered 2021-06-15 – 2021-06-18 (×7): 10 mg via ORAL
  Filled 2021-06-14 (×7): qty 2

## 2021-06-14 MED ORDER — SODIUM CHLORIDE 0.9 % IV SOLN
2.0000 g | Freq: Once | INTRAVENOUS | Status: AC
  Administered 2021-06-14: 2 g via INTRAVENOUS
  Filled 2021-06-14: qty 12.5

## 2021-06-14 MED ORDER — DULOXETINE HCL 30 MG PO CPEP
60.0000 mg | ORAL_CAPSULE | Freq: Every day | ORAL | Status: DC
  Administered 2021-06-15 – 2021-06-18 (×4): 60 mg via ORAL
  Filled 2021-06-14: qty 2
  Filled 2021-06-14: qty 1
  Filled 2021-06-14 (×2): qty 2

## 2021-06-14 MED ORDER — DULOXETINE HCL 30 MG PO CPEP: 30.0000 mg | ORAL_CAPSULE | Freq: Two times a day (BID) | ORAL | Status: DC

## 2021-06-14 MED ORDER — PANTOPRAZOLE SODIUM 40 MG IV SOLR
40.0000 mg | Freq: Two times a day (BID) | INTRAVENOUS | Status: DC
Start: 2021-06-14 — End: 2021-06-16
  Administered 2021-06-14 – 2021-06-16 (×4): 40 mg via INTRAVENOUS
  Filled 2021-06-14 (×4): qty 10

## 2021-06-14 MED ORDER — MORPHINE SULFATE (PF) 2 MG/ML IV SOLN: 2.0000 mg | INTRAVENOUS | Status: DC | PRN

## 2021-06-14 MED ORDER — IOHEXOL 300 MG/ML  SOLN
100.0000 mL | Freq: Once | INTRAMUSCULAR | Status: AC | PRN
  Administered 2021-06-14: 100 mL via INTRAVENOUS

## 2021-06-14 MED ORDER — HEPARIN SODIUM (PORCINE) 5000 UNIT/ML IJ SOLN
5000.0000 [IU] | Freq: Two times a day (BID) | INTRAMUSCULAR | Status: DC
  Administered 2021-06-14 – 2021-06-15 (×2): 5000 [IU] via SUBCUTANEOUS
  Filled 2021-06-14 (×2): qty 1

## 2021-06-14 MED ORDER — ACETAMINOPHEN 325 MG PO TABS
650.0000 mg | ORAL_TABLET | Freq: Four times a day (QID) | ORAL | Status: DC | PRN
  Administered 2021-06-15: 650 mg via ORAL
  Filled 2021-06-14: qty 2

## 2021-06-14 MED ORDER — LACTATED RINGERS IV BOLUS (SEPSIS)
1000.0000 mL | Freq: Once | INTRAVENOUS | Status: AC
  Administered 2021-06-14: 1000 mL via INTRAVENOUS

## 2021-06-14 MED ORDER — BUPROPION HCL ER (SR) 100 MG PO TB12
100.0000 mg | ORAL_TABLET | Freq: Two times a day (BID) | ORAL | Status: DC
  Administered 2021-06-15: 100 mg via ORAL
  Filled 2021-06-14: qty 1

## 2021-06-14 MED ORDER — ACETAMINOPHEN 650 MG RE SUPP: 650.0000 mg | Freq: Four times a day (QID) | RECTAL | Status: DC | PRN

## 2021-06-14 MED ORDER — VANCOMYCIN HCL 1500 MG/300ML IV SOLN
1500.0000 mg | Freq: Once | INTRAVENOUS | Status: AC
Start: 2021-06-14 — End: 2021-06-14
  Administered 2021-06-14: 1500 mg via INTRAVENOUS
  Filled 2021-06-14: qty 300

## 2021-06-14 MED ORDER — LACTATED RINGERS IV SOLN: INTRAVENOUS | Status: DC

## 2021-06-14 MED ORDER — METRONIDAZOLE 500 MG/100ML IV SOLN
500.0000 mg | Freq: Once | INTRAVENOUS | Status: AC
  Administered 2021-06-14: 500 mg via INTRAVENOUS
  Filled 2021-06-14: qty 100

## 2021-06-14 MED ORDER — SODIUM CHLORIDE 0.9% FLUSH
3.0000 mL | Freq: Two times a day (BID) | INTRAVENOUS | Status: DC
  Administered 2021-06-14 – 2021-06-18 (×7): 3 mL via INTRAVENOUS

## 2021-06-14 MED ORDER — DULOXETINE HCL 30 MG PO CPEP
30.0000 mg | ORAL_CAPSULE | Freq: Every day | ORAL | Status: DC
  Administered 2021-06-15 – 2021-06-17 (×3): 30 mg via ORAL
  Filled 2021-06-14 (×3): qty 1

## 2021-06-14 MED ORDER — VANCOMYCIN HCL IN DEXTROSE 1-5 GM/200ML-% IV SOLN
1000.0000 mg | Freq: Once | INTRAVENOUS | Status: DC
  Filled 2021-06-14: qty 200

## 2021-06-14 NOTE — H&P (Signed)
?History and Physical  ? ? ?Patient: Robert Le DOB: 12/10/1940 ?DOA: 06/14/2021 ?DOS: the patient was seen and examined on 06/15/2021 ?PCP: Venia Carbon, MD  ?Patient coming from: [facility] ? ?Chief Complaint:  ?Chief Complaint  ?Patient presents with  ? Fever  ? ?HPI: Robert Le is a 81 y.o. male with medical history significant of Alzheimer's disease, presenting today with altered mental status and fever. ?Patient was recently discharged after being diagnosed with acute cholecystitis and having a percutaneous cholecystostomy tube that was placed on 3/18 per report tube was capped.  Today in the emergency room patient is not able to provide HPI due to dementia.  Per ED provider general surgery on-call was consulted and recommended that we drain the bag passively and if not functional patient may need replacement.  Admission requested for IV antibiotics patient today is altered and febrile and meets sepsis criteria. ? ? ?In the emergency room blood work shows glucose of 115 ALT of 179 AST of 169, white count of 15.9, blood cultures urine cultures lipase and lactic acid have been collected in the emergency room.  EKG sinus rhythm today. ? ?Review of Systems: unable to review all systems due to the inability of the patient to answer questions. ?Past Medical History:  ?Diagnosis Date  ? Alzheimer disease ([place])   ? Anxiety   ? Arthritis   ? Chronic kidney disease   ? L TOTAL NEPHRECTOMY  ? Hypertension   ? ?Past Surgical History:  ?Procedure Laterality Date  ? FRACTURE SURGERY    ? [place]  ? IR EXCHANGE BILIARY DRAIN  06/10/2021  ? IR PERC CHOLECYSTOSTOMY  04/17/2021  ? 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 TOTAL HIP ARTHROPLASTY Left 08/20/2013  ? Procedure: LEFT TOTAL HIP ARTHROPLASTY ANTERIOR APPROACH;  Surgeon: Mauri Pole, MD;  Location: [facility];  Service: Orthopedics;  Laterality: Left;  ? ?Social History:  reports that he has never smoked. He  has never used smokeless tobacco. He reports that he does not drink alcohol and does not use drugs. ? ?Allergies  ?Allergen Reactions  ? Donepezil Other (See Comments)  ?  Increased confusion  ? Sulfites   ?  Unable to recall reaction or specific sulfa drug  ? ? ?Family History  ?Problem Relation Age of Onset  ? Heart disease Father   ? Arthritis Brother   ? Arthritis Brother   ? Cancer Neg Hx   ? Diabetes Neg Hx   ? ? ?Prior to Admission medications   ?Medication Sig Start Date End Date Taking? Authorizing Provider  ?acetaminophen (TYLENOL) 500 MG tablet Take 500 mg by mouth 3 (three) times daily as needed for mild pain.    [provider]  ?apixaban (ELIQUIS) 5 MG TABS tablet Take 2 tablets (10 mg total) by mouth 2 (two) times daily for 7 days. 04/20/21 04/27/21  Enzo Bi, MD  ?apixaban (ELIQUIS) 5 MG TABS tablet Take 1 tablet (5 mg total) by mouth 2 (two) times daily. 04/27/21 07/26/21  Enzo Bi, MD  ?buPROPion ER [facility] SR) 100 MG 12 hr tablet Take 100 mg by mouth 2 (two) times daily. 02/23/21   [provider]  ?donepezil (ARICEPT) 5 MG tablet Take 5 mg by mouth daily. 02/23/21   [provider]  ?DULoxetine (CYMBALTA) 30 MG capsule Take 30-60 mg by mouth 2 (two) times daily. 60 mg every morning and 30 mg daily at 4  pm    [provider]  ?gabapentin (NEURONTIN) 300 MG capsule Take 300 mg by mouth 2 (two) times daily. 03/25/21   [provider]  ?HYDROcodone-acetaminophen (NORCO/VICODIN) 5-325 MG tablet Take 1 tablet by mouth every 6 (six) hours as needed for moderate pain.    [provider]  ?memantine (NAMENDA) 10 MG tablet Take 10 mg by mouth 2 (two) times daily. 02/23/21   [provider]  ?Multiple Vitamin (MULTIVITAMIN WITH MINERALS) TABS tablet Take 1 tablet by mouth daily. 05/05/16   Venia Carbon, MD  ?polyethylene glycol (MIRALAX / GLYCOLAX) 17 g packet Take 17 g by mouth every 3 (three) days.    [provider]  ? ? ?Physical  Exam: ?Vitals:  ? 06/14/21 2300 06/14/21 2330 06/15/21 0130 06/15/21 0200  ?BP: 107/79 116/81 120/67 115/69  ?Pulse: 95 99 70 78  ?Resp: '19 16 13 14  '$ ?Temp:      ?TempSrc:      ?SpO2: 96% 100% 94% 99%  ?Weight:      ?Height:      ? ?Physical Exam ?Vitals and nursing note reviewed.  ?Constitutional:   ?   General: He is not in acute distress. ?   Appearance: He is not ill-appearing, toxic-appearing or diaphoretic.  ?HENT:  ?   Head: Normocephalic and atraumatic.  ?   Right Ear: External ear normal.  ?   Left Ear: External ear normal.  ?   Nose: Nose normal. No nasal deformity.  ?   Mouth/Throat:  ?   Lips: Pink.  ?   Mouth: Mucous membranes are moist.  ?   Tongue: No lesions.  ?   Pharynx: Oropharynx is clear.  ?Eyes:  ?   Pupils: Pupils are equal, round, and reactive to light.  ?Cardiovascular:  ?   Rate and Rhythm: Normal rate and regular rhythm.  ?   Pulses: Normal pulses.  ?   Heart sounds: Normal heart sounds.  ?Pulmonary:  ?   Effort: Pulmonary effort is normal.  ?   Breath sounds: Normal breath sounds.  ?Abdominal:  ?   General: Bowel sounds are normal. There is no distension.  ?   Palpations: Abdomen is soft. There is no mass.  ?   Tenderness: There is no abdominal tenderness. There is no guarding.  ?   Hernia: No hernia is present.  ?Musculoskeletal:  ?   Right lower leg: No edema.  ?   Left lower leg: No edema.  ?Skin: ?   General: Skin is warm.  ?Neurological:  ?   Mental Status: He is lethargic and disoriented.  ? ? ?Data Reviewed: ?Results for orders placed or performed during the hospital encounter of 06/14/21 (from the past 24 hour(s))  ?Lactic acid, plasma     Status: None  ? Collection Time: 06/14/21  6:28 PM  ?Result Value Ref Range  ? Lactic Acid, Venous 1.9 0.5 - 1.9 mmol/L  ?Comprehensive metabolic panel     Status: Abnormal  ? Collection Time: 06/14/21  6:28 PM  ?Result Value Ref Range  ? Sodium 141 135 - 145 mmol/L  ? Potassium 3.9 3.5 - 5.1 mmol/L  ? Chloride 105 98 - 111 mmol/L  ? CO2 27 22 -  32 mmol/L  ? Glucose, Bld 115 (H) 70 - 99 mg/dL  ? BUN 18 8 - 23 mg/dL  ? Creatinine, Ser 1.08 0.61 - 1.24 mg/dL  ? Calcium 9.1 8.9 - 10.3 mg/dL  ? Total Protein 6.2 (  L) 6.5 - 8.1 g/dL  ? Albumin 3.6 3.5 - 5.0 g/dL  ? AST 169 (H) 15 - 41 U/L  ? ALT 179 (H) 0 - 44 U/L  ? Alkaline Phosphatase 117 38 - 126 U/L  ? Total Bilirubin 1.0 0.3 - 1.2 mg/dL  ? GFR, Estimated >60 >60 mL/min  ? Anion gap 9 5 - 15  ?CBC with Differential     Status: Abnormal  ? Collection Time: 06/14/21  6:28 PM  ?Result Value Ref Range  ? WBC 15.9 (H) 4.0 - 10.5 K/uL  ? RBC 5.24 4.22 - 5.81 MIL/uL  ? Hemoglobin 15.5 13.0 - 17.0 g/dL  ? HCT 48.0 39.0 - 52.0 %  ? MCV 91.6 80.0 - 100.0 fL  ? MCH 29.6 26.0 - 34.0 pg  ? MCHC 32.3 30.0 - 36.0 g/dL  ? RDW 14.6 11.5 - 15.5 %  ? Platelets 196 150 - 400 K/uL  ? nRBC 0.0 0.0 - 0.2 %  ? Neutrophils Relative % 92 %  ? Neutro Abs 14.6 (H) 1.7 - 7.7 K/uL  ? Lymphocytes Relative 2 %  ? Lymphs Abs 0.4 (L) 0.7 - 4.0 K/uL  ? Monocytes Relative 5 %  ? Monocytes Absolute 0.8 0.1 - 1.0 K/uL  ? Eosinophils Relative 0 %  ? Eosinophils Absolute 0.0 0.0 - 0.5 K/uL  ? Basophils Relative 0 %  ? Basophils Absolute 0.1 0.0 - 0.1 K/uL  ? Immature Granulocytes 1 %  ? Abs Immature Granulocytes 0.10 (H) 0.00 - 0.07 K/uL  ?Protime-INR     Status: None  ? Collection Time: 06/14/21  6:28 PM  ?Result Value Ref Range  ? Prothrombin Time 15.1 11.4 - 15.2 seconds  ? INR 1.2 0.8 - 1.2  ?APTT     Status: None  ? Collection Time: 06/14/21  6:28 PM  ?Result Value Ref Range  ? aPTT 31 24 - 36 seconds  ?Urinalysis, Complete w Microscopic Urine, Catheterized     Status: Abnormal  ? Collection Time: 06/14/21  6:28 PM  ?Result Value Ref Range  ? Color, Urine YELLOW (A) YELLOW  ? APPearance CLEAR (A) CLEAR  ? Specific Gravity, Urine 1.020 1.005 - 1.030  ? pH 6.0 5.0 - 8.0  ? Glucose, UA NEGATIVE NEGATIVE mg/dL  ? Hgb urine dipstick NEGATIVE NEGATIVE  ? Bilirubin Urine NEGATIVE NEGATIVE  ? Ketones, ur 20 (A) NEGATIVE mg/dL  ? Protein, ur NEGATIVE  NEGATIVE mg/dL  ? Nitrite NEGATIVE NEGATIVE  ? Leukocytes,Ua NEGATIVE NEGATIVE  ? WBC, UA 0-5 0 - 5 WBC/hpf  ? Bacteria, UA NONE SEEN NONE SEEN  ? Squamous Epithelial / LPF 0-5 0 - 5  ? Mucus PRESENT

## 2021-06-14 NOTE — ED Triage Notes (Signed)
Pt BIB [facility] from [facility] for AMS, fever. Per EMS, since 1500 today pt has been not as active as usual. Recent gallbladder infection, biliary drain in place. ? ? ?

## 2021-06-14 NOTE — ED Notes (Signed)
Report received from Kate, RN

## 2021-06-14 NOTE — H&P (Incomplete)
?History and Physical  ? ? ?Patient: Robert Le DOB: [birth year] ?DOA: 06/14/2021 ?DOS: the patient was seen and examined on 06/14/2021 ?PCP: Venia Carbon, MD  ?Patient coming from: [facility] ? ?Chief Complaint:  ?Chief Complaint  ?Patient presents with  ? Fever  ? ?HPI: Robert Le is a 81 y.o. male with medical history significant of Alzheimer's disease, presenting today with altered mental status and fever. ?Patient was recently discharged after being diagnosed with acute cholecystitis and having a percutaneous cholecystostomy tube that was placed on 3/18 per report tube was capped.  Today in the emergency room patient is not able to provide HPI due to dementia.  Per ED provider general surgery on-call was consulted and recommended that we drain the bag passively and if not functional patient may need replacement.  Admission requested for IV antibiotics patient today is altered and febrile and meets sepsis criteria. ? ?Review of Systems: unable to review all systems due to the inability of the patient to answer questions. ?Past Medical History:  ?Diagnosis Date  ? Alzheimer disease ([place])   ? Anxiety   ? Arthritis   ? Chronic kidney disease   ? L TOTAL NEPHRECTOMY  ? Hypertension   ? ?Past Surgical History:  ?Procedure Laterality Date  ? FRACTURE SURGERY    ? [place]  ? IR EXCHANGE BILIARY DRAIN  06/10/2021  ? IR PERC CHOLECYSTOSTOMY  04/17/2021  ? 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 TOTAL HIP ARTHROPLASTY Left 08/20/2013  ? Procedure: LEFT TOTAL HIP ARTHROPLASTY ANTERIOR APPROACH;  Surgeon: Mauri Pole, MD;  Location: [facility];  Service: Orthopedics;  Laterality: Left;  ? ?Social History:  reports that he has never smoked. He has never used smokeless tobacco. He reports that he does not drink alcohol and does not use drugs. ? ?Allergies  ?Allergen Reactions  ? Donepezil Other (See Comments)  ?  Increased confusion  ? Sulfites   ?  Unable to  recall reaction or specific sulfa drug  ? ? ?Family History  ?Problem Relation Age of Onset  ? Heart disease Father   ? Arthritis Brother   ? Arthritis Brother   ? Cancer Neg Hx   ? Diabetes Neg Hx   ? ? ?Prior to Admission medications   ?Medication Sig Start Date End Date Taking? Authorizing Provider  ?acetaminophen (TYLENOL) 500 MG tablet Take 500 mg by mouth 3 (three) times daily as needed for mild pain.    [provider]  ?apixaban (ELIQUIS) 5 MG TABS tablet Take 2 tablets (10 mg total) by mouth 2 (two) times daily for 7 days. 04/20/21 04/27/21  Enzo Bi, MD  ?apixaban (ELIQUIS) 5 MG TABS tablet Take 1 tablet (5 mg total) by mouth 2 (two) times daily. 04/27/21 07/26/21  Enzo Bi, MD  ?buPROPion ER [facility] SR) 100 MG 12 hr tablet Take 100 mg by mouth 2 (two) times daily. 02/23/21   [provider]  ?donepezil (ARICEPT) 5 MG tablet Take 5 mg by mouth daily. 02/23/21   [provider]  ?DULoxetine (CYMBALTA) 30 MG capsule Take 30-60 mg by mouth 2 (two) times daily. 60 mg every morning and 30 mg daily at 4 pm    [provider]  ?gabapentin (NEURONTIN) 300 MG capsule Take 300 mg by mouth 2 (two) times daily. 03/25/21   [provider]  ?HYDROcodone-acetaminophen (NORCO/VICODIN) 5-325 MG tablet Take 1 tablet by mouth every 6 (six)  hours as needed for moderate pain.    [provider]  ?memantine (NAMENDA) 10 MG tablet Take 10 mg by mouth 2 (two) times daily. 02/23/21   [provider]  ?Multiple Vitamin (MULTIVITAMIN WITH MINERALS) TABS tablet Take 1 tablet by mouth daily. 05/05/16   Venia Carbon, MD  ?polyethylene glycol (MIRALAX / GLYCOLAX) 17 g packet Take 17 g by mouth every 3 (three) days.    [provider]  ? ? ?Physical Exam: ?Vitals:  ? 06/14/21 2000 06/14/21 2030 06/14/21 2100 06/14/21 2130  ?BP: 114/78 115/72 129/74 114/71  ?Pulse: (!) 103 (!) 101 (!) 103 94  ?Resp: '20 17 17 15  '$ ?Temp:    99 ?F (37.2 ?C)  ?TempSrc:    Rectal  ?SpO2:  93% 93% 100% 100%  ?Weight:      ?Height:      ? ?Physical Exam ?Vitals and nursing note reviewed.  ?Constitutional:   ?   General: He is not in acute distress. ?   Appearance: Normal appearance. He is not ill-appearing, toxic-appearing or diaphoretic.  ?HENT:  ?   Head: Normocephalic and atraumatic.  ?   Right Ear: Hearing and external ear normal.  ?   Left Ear: Hearing and external ear normal.  ?   Nose: Nose normal. No nasal deformity.  ?   Mouth/Throat:  ?   Lips: Pink.  ?   Mouth: Mucous membranes are moist.  ?   Tongue: No lesions.  ?   Pharynx: Oropharynx is clear.  ?Eyes:  ?   Extraocular Movements: Extraocular movements intact.  ?   Pupils: Pupils are equal, round, and reactive to light.  ?Neck:  ?   Vascular: No carotid bruit.  ?Cardiovascular:  ?   Rate and Rhythm: Normal rate and regular rhythm.  ?   Pulses: Normal pulses.  ?   Heart sounds: Normal heart sounds.  ?Pulmonary:  ?   Effort: Pulmonary effort is normal.  ?   Breath sounds: Normal breath sounds.  ?Abdominal:  ?   General: Bowel sounds are normal. There is no distension.  ?   Palpations: Abdomen is soft. There is no mass.  ?   Tenderness: There is no abdominal tenderness. There is no guarding.  ?   Hernia: No hernia is present.  ?Musculoskeletal:  ?   Right lower leg: No edema.  ?   Left lower leg: No edema.  ?Skin: ?   General: Skin is warm.  ?Neurological:  ?   General: No focal deficit present.  ?   Mental Status: He is alert and oriented to person, place, and time.  ?   Cranial Nerves: Cranial nerves 2-12 are intact.  ?   Motor: Motor function is intact.  ?Psychiatric:     ?   Attention and Perception: Attention normal.     ?   Mood and Affect: Mood normal.     ?   Speech: Speech normal.     ?   Behavior: Behavior normal. Behavior is cooperative.     ?   Cognition and Memory: Cognition normal.  ? ? ?Data Reviewed: ?Results for orders placed or performed during the hospital encounter of 06/14/21 (from the past 24 hour(s))  ?Lactic acid,  plasma     Status: None  ? Collection Time: 06/14/21  6:28 PM  ?Result Value Ref Range  ? Lactic Acid, Venous 1.9 0.5 - 1.9 mmol/L  ?Comprehensive metabolic panel     Status: Abnormal  ?  Collection Time: 06/14/21  6:28 PM  ?Result Value Ref Range  ? Sodium 141 135 - 145 mmol/L  ? Potassium 3.9 3.5 - 5.1 mmol/L  ? Chloride 105 98 - 111 mmol/L  ? CO2 27 22 - 32 mmol/L  ? Glucose, Bld 115 (H) 70 - 99 mg/dL  ? BUN 18 8 - 23 mg/dL  ? Creatinine, Ser 1.08 0.61 - 1.24 mg/dL  ? Calcium 9.1 8.9 - 10.3 mg/dL  ? Total Protein 6.2 (L) 6.5 - 8.1 g/dL  ? Albumin 3.6 3.5 - 5.0 g/dL  ? AST 169 (H) 15 - 41 U/L  ? ALT 179 (H) 0 - 44 U/L  ? Alkaline Phosphatase 117 38 - 126 U/L  ? Total Bilirubin 1.0 0.3 - 1.2 mg/dL  ? GFR, Estimated >60 >60 mL/min  ? Anion gap 9 5 - 15  ?CBC with Differential     Status: Abnormal  ? Collection Time: 06/14/21  6:28 PM  ?Result Value Ref Range  ? WBC 15.9 (H) 4.0 - 10.5 K/uL  ? RBC 5.24 4.22 - 5.81 MIL/uL  ? Hemoglobin 15.5 13.0 - 17.0 g/dL  ? HCT 48.0 39.0 - 52.0 %  ? MCV 91.6 80.0 - 100.0 fL  ? MCH 29.6 26.0 - 34.0 pg  ? MCHC 32.3 30.0 - 36.0 g/dL  ? RDW 14.6 11.5 - 15.5 %  ? Platelets 196 150 - 400 K/uL  ? nRBC 0.0 0.0 - 0.2 %  ? Neutrophils Relative % 92 %  ? Neutro Abs 14.6 (H) 1.7 - 7.7 K/uL  ? Lymphocytes Relative 2 %  ? Lymphs Abs 0.4 (L) 0.7 - 4.0 K/uL  ? Monocytes Relative 5 %  ? Monocytes Absolute 0.8 0.1 - 1.0 K/uL  ? Eosinophils Relative 0 %  ? Eosinophils Absolute 0.0 0.0 - 0.5 K/uL  ? Basophils Relative 0 %  ? Basophils Absolute 0.1 0.0 - 0.1 K/uL  ? Immature Granulocytes 1 %  ? Abs Immature Granulocytes 0.10 (H) 0.00 - 0.07 K/uL  ?Protime-INR     Status: None  ? Collection Time: 06/14/21  6:28 PM  ?Result Value Ref Range  ? Prothrombin Time 15.1 11.4 - 15.2 seconds  ? INR 1.2 0.8 - 1.2  ?APTT     Status: None  ? Collection Time: 06/14/21  6:28 PM  ?Result Value Ref Range  ? aPTT 31 24 - 36 seconds  ?Urinalysis, Complete w Microscopic Urine, Catheterized     Status: Abnormal  ?  Collection Time: 06/14/21  6:28 PM  ?Result Value Ref Range  ? Color, Urine YELLOW (A) YELLOW  ? APPearance CLEAR (A) CLEAR  ? Specific Gravity, Urine 1.020 1.005 - 1.030  ? pH 6.0 5.0 - 8.0  ? Glucose, UA NEGATIVE NEG

## 2021-06-14 NOTE — Sepsis Progress Note (Signed)
Code Sepsis protocol being monitored by eLink. 

## 2021-06-14 NOTE — Code Documentation (Signed)
CODE SEPSIS - PHARMACY COMMUNICATION ? ?**Broad Spectrum Antibiotics should be administered within 1 hour of Sepsis diagnosis** ? ?Time Code Sepsis Called/Page Received: 1638 ? ?Antibiotics Ordered: cefepime, vancomycin, flagyl ? ?Time of 1st antibiotic administration: 4665 ? ? ?Darrick Penna ,PharmD ?Clinical Pharmacist  ?06/14/2021  6:44 PM ? ?

## 2021-06-14 NOTE — ED Provider Notes (Signed)
? ?[facility] ?Provider Note ? ? ? Event Date/Time  ? First MD Initiated Contact with Patient 06/14/21 1831   ?  (approximate) ? ? ?History  ? ?Fever ? ? ?HPI ? ?Robert Le is a 81 y.o. male past medical history of Alzheimer's dementia, acute cholecystitis status post recent [place] now capped, hypertension who presents with altered mental status.  Per EMS patient is typically alert and active but since around 3 PM today he has been more difficult to arouse.  He resides in memory care unit.  Patient with recent admission for cholecystitis status post [place].  I see a telephone note from 5/11 saying that the drain was capped. ? ?Unable to obtain history from patient due to significant altered mental status and underlying dementia. ?  ? ?Past Medical History:  ?Diagnosis Date  ? Alzheimer disease ([place])   ? Anxiety   ? Arthritis   ? Chronic kidney disease   ? L TOTAL NEPHRECTOMY  ? Hypertension   ? ? ?Patient Active Problem List  ? Diagnosis Date Noted  ? Acute cholecystitis 04/17/2021  ? Sepsis ([place]) 04/17/2021  ? Acute metabolic encephalopathy 94/76/5465  ? 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 Alzheimer's dementia without behavioral disturbance ([place]) 12/21/2015  ? History of hypertension 04/21/2015  ? Insomnia 04/21/2015  ? ? ? ?Physical Exam  ?Triage Vital Signs: ?ED Triage Vitals  ?Enc Vitals Group  ?   BP 06/14/21 1824 (!) 141/83  ?   Pulse Rate 06/14/21 1824 (!) 112  ?   Resp 06/14/21 1824 (!) 28  ?   Temp 06/14/21 1824 (!) 100.6 ?F (38.1 ?C)  ?   Temp Source 06/14/21 1824 Axillary  ?   SpO2 06/14/21 1822 98 %  ?   Weight 06/14/21 1827 160 lb (72.6 kg)  ?   Height 06/14/21 1827 '5\' 7"'$  (1.702 m)  ?   Head Circumference --   ?   Peak Flow --   ?   Pain Score --   ?   Pain Loc --   ?   Pain Edu? --   ?   Excl. in [place]? --   ? ? ?Most recent vital signs: ?Vitals:  ? 06/14/21 2000 06/14/21 2030  ?BP: 114/78 115/72  ?Pulse: (!) 103 (!) 101  ?Resp: 20 17  ?Temp:    ?SpO2: 93% 93%   ? ? ? ?General: Awake, patient appears ill ?CV:  Good peripheral perfusion.  ?Resp:  Normal effort. No increased wob ?Abd:  No distention.  Abdomen is soft, patient does grimace somewhat to palpation throughout, there is a PERC Coley drain in place no surrounding erythema there is no drainage is capped ?Neuro:            Did not opens eyes to voice does not have comprehensible speech, does move all extremities spontaneously ?Other:  No meningismus ? ? ?ED Results / Procedures / Treatments  ?Labs ?(all labs ordered are listed, but only abnormal results are displayed) ?Labs Reviewed  ?COMPREHENSIVE METABOLIC PANEL - Abnormal; Notable for the following components:  ?    Result Value  ? Glucose, Bld 115 (*)   ? Total Protein 6.2 (*)   ? AST 169 (*)   ? ALT 179 (*)   ? All other components within normal limits  ?CBC WITH DIFFERENTIAL/PLATELET - Abnormal; Notable for the following components:  ? WBC 15.9 (*)   ? Neutro Abs 14.6 (*)   ? Lymphs Abs 0.4 (*)   ?  Abs Immature Granulocytes 0.10 (*)   ? All other components within normal limits  ?URINALYSIS, COMPLETE (UACMP) WITH MICROSCOPIC - Abnormal; Notable for the following components:  ? Color, Urine YELLOW (*)   ? APPearance CLEAR (*)   ? Ketones, ur 20 (*)   ? All other components within normal limits  ?CULTURE, BLOOD (ROUTINE X 2)  ?CULTURE, BLOOD (ROUTINE X 2)  ?URINE CULTURE  ?LACTIC ACID, PLASMA  ?PROTIME-INR  ?APTT  ?LIPASE, BLOOD  ?LACTIC ACID, PLASMA  ? ? ? ?EKG ? ?EKG interpreted by myself shows normal sinus rhythm with artifact, normal axis no acute ischemic changes ? ? ?RADIOLOGY ? ?I viewed the chest x-ray interpreted which does not show any acute abnormality ? ?I reviewed the CT scan of the brain which does not show any acute intracranial process; agree with radiology report  ? ? ?PROCEDURES: ? ?Critical Care performed: Yes, see critical care procedure note(s) ? ?.1-3 Lead EKG Interpretation ?Performed by: Rada Hay, MD ?Authorized by: Rada Hay, MD  ? ?  Interpretation: abnormal   ?  ECG rate assessment: tachycardic   ?  Rhythm: sinus tachycardia   ?  Ectopy: none   ?  Conduction: normal   ? ?The patient is on the cardiac monitor to evaluate for evidence of arrhythmia and/or significant heart rate changes. ? ? ?MEDICATIONS ORDERED IN ED: ?Medications  ?vancomycin (VANCOREADY) IVPB 1500 mg/300 mL (1,500 mg Intravenous New Bag/Given 06/14/21 2133)  ?lactated ringers bolus 1,000 mL (0 mLs Intravenous Stopped 06/14/21 1920)  ?ceFEPIme (MAXIPIME) 2 g in sodium chloride 0.9 % 100 mL IVPB (0 g Intravenous Stopped 06/14/21 1922)  ?metroNIDAZOLE (FLAGYL) IVPB 500 mg (0 mg Intravenous Stopped 06/14/21 1951)  ?iohexol (OMNIPAQUE) 300 MG/ML solution 100 mL (100 mLs Intravenous Contrast Given 06/14/21 1959)  ? ? ? ?IMPRESSION / MDM / ASSESSMENT AND PLAN / ED COURSE  ?I reviewed the triage vital signs and the nursing notes. ?             ?               ? ?Differential diagnosis includes, but is not limited to, biliary infection, UTI, pneumonia, meningitis/encephalitis, bacteremia ? ?The patient is an 81 year old male with recent cholecystitis status post PERC [place]ole on 3/18 who is currently residing in a memory care unit who presents with altered mental status and fever.  Patient cannot provide any history.  Currently he is normally more alert and active although he does have a history of Alzheimer's.  Has been decreased responsive since around 3 PM today.  He is febrile to 101.6 mildly tachycardic.  On exam he appears ill his has a PERC Coley in place there is no drainage from it there is no surrounding cellulitis that is capped no bag.  Abdomen overall is benign but he does grimace to palpation.  No obvious skin or soft tissue infection.  Alert was called patient was started on broad-spectrum antibiotics with cefepime vancomycin and Flagyl and given a fluid bolus.  His lactate is negative he does have a white count of about 16 AST and ALT are mildly increased which is  new compared to prior.  His lipase is negative.  Blood cultures are in process.  CT abdomen pelvis was obtained which shows compressed gallbladder with some gallbladder wall thickening.  CT head is negative chest x-ray does not show any pneumonia.  UA is not consistent with infection.  With the increased LFTs I am concerned  about the biliary source.  Will discuss with general surgery about whether there is anything additional to do or if we should put a bag on the drain to continue to drain the gallbladder. ? ?Dr. Christian Mate discussed patient with me.  Does recommend either drain the bag passively or actively with a JP drain.  Will admit to the hospitalist service for ongoing IV antibiotics.  If drain is not functioning IR will need to replace. ? ? ?FINAL CLINICAL IMPRESSION(S) / ED DIAGNOSES  ? ?Final diagnoses:  ?Sepsis, due to unspecified organism, unspecified whether acute organ dysfunction present [facility])  ? ? ? ?Rx / DC Orders  ? ?ED Discharge Orders   ? ? None  ? ?  ? ? ? ?Note:  This document was prepared using Dragon voice recognition software and may include unintentional dictation errors. ?  ?Rada Hay, MD ?06/14/21 2133 ? ?

## 2021-06-14 NOTE — ED Notes (Signed)
Pt moved to C-pod and placed on a hospital bed ?

## 2021-06-15 ENCOUNTER — Encounter: Payer: Self-pay | Admitting: Internal Medicine

## 2021-06-15 ENCOUNTER — Other Ambulatory Visit: Payer: Self-pay

## 2021-06-15 DIAGNOSIS — G9341 Metabolic encephalopathy: Secondary | ICD-10-CM | POA: Diagnosis not present

## 2021-06-15 DIAGNOSIS — Z7189 Other specified counseling: Secondary | ICD-10-CM

## 2021-06-15 DIAGNOSIS — F0280 Dementia in other diseases classified elsewhere without behavioral disturbance: Secondary | ICD-10-CM

## 2021-06-15 DIAGNOSIS — R7881 Bacteremia: Secondary | ICD-10-CM | POA: Diagnosis not present

## 2021-06-15 DIAGNOSIS — K810 Acute cholecystitis: Secondary | ICD-10-CM

## 2021-06-15 DIAGNOSIS — G309 Alzheimer's disease, unspecified: Secondary | ICD-10-CM

## 2021-06-15 DIAGNOSIS — A4151 Sepsis due to Escherichia coli [E. coli]: Secondary | ICD-10-CM

## 2021-06-15 DIAGNOSIS — B9620 Unspecified Escherichia coli [E. coli] as the cause of diseases classified elsewhere: Secondary | ICD-10-CM | POA: Diagnosis not present

## 2021-06-15 DIAGNOSIS — Z86718 Personal history of other venous thrombosis and embolism: Secondary | ICD-10-CM

## 2021-06-15 DIAGNOSIS — K8309 Other cholangitis: Secondary | ICD-10-CM | POA: Diagnosis not present

## 2021-06-15 LAB — BLOOD CULTURE ID PANEL (REFLEXED) - BCID2

## 2021-06-15 LAB — CBC
HCT: 40.2 % (ref 39.0–52.0)
Hemoglobin: 13 g/dL (ref 13.0–17.0)
MCH: 29.7 pg (ref 26.0–34.0)
MCHC: 32.3 g/dL (ref 30.0–36.0)
MCV: 92 fL (ref 80.0–100.0)
Platelets: 171 10*3/uL (ref 150–400)
RBC: 4.37 MIL/uL (ref 4.22–5.81)
RDW: 14.6 % (ref 11.5–15.5)
WBC: 11.8 10*3/uL — ABNORMAL HIGH (ref 4.0–10.5)
nRBC: 0 % (ref 0.0–0.2)

## 2021-06-15 LAB — COMPREHENSIVE METABOLIC PANEL
ALT: 106 U/L — ABNORMAL HIGH (ref 0–44)
AST: 73 U/L — ABNORMAL HIGH (ref 15–41)
Albumin: 3 g/dL — ABNORMAL LOW (ref 3.5–5.0)
Alkaline Phosphatase: 83 U/L (ref 38–126)
Anion gap: 12 (ref 5–15)
BUN: 14 mg/dL (ref 8–23)
CO2: 23 mmol/L (ref 22–32)
Calcium: 8.9 mg/dL (ref 8.9–10.3)
Chloride: 104 mmol/L (ref 98–111)
Creatinine, Ser: 0.99 mg/dL (ref 0.61–1.24)
GFR, Estimated: >60 mL/min (ref >60)
Glucose, Bld: 90 mg/dL (ref 70–99)
Potassium: 4.3 mmol/L (ref 3.5–5.1)
Sodium: 139 mmol/L (ref 135–145)
Total Bilirubin: 0.8 mg/dL (ref 0.3–1.2)
Total Protein: 5.4 g/dL — ABNORMAL LOW (ref 6.5–8.1)

## 2021-06-15 MED ORDER — APIXABAN 5 MG PO TABS
5.0000 mg | ORAL_TABLET | Freq: Two times a day (BID) | ORAL | Status: DC
  Administered 2021-06-15 – 2021-06-18 (×7): 5 mg via ORAL
  Filled 2021-06-15 (×7): qty 1

## 2021-06-15 MED ORDER — SODIUM CHLORIDE 0.9 % IV SOLN
2.0000 g | INTRAVENOUS | Status: DC
  Administered 2021-06-15 – 2021-06-17 (×3): 2 g via INTRAVENOUS
  Filled 2021-06-15 (×2): qty 20
  Filled 2021-06-15 (×2): qty 2

## 2021-06-15 MED ORDER — SODIUM CHLORIDE 0.9% FLUSH
5.0000 mL | Freq: Three times a day (TID) | INTRAVENOUS | Status: DC
  Administered 2021-06-15 – 2021-06-18 (×8): 5 mL

## 2021-06-15 MED ORDER — ORAL CARE MOUTH RINSE
15.0000 mL | Freq: Two times a day (BID) | OROMUCOSAL | Status: DC
  Administered 2021-06-16 – 2021-06-17 (×4): 15 mL via OROMUCOSAL

## 2021-06-15 MED ORDER — METRONIDAZOLE 500 MG/100ML IV SOLN
500.0000 mg | Freq: Two times a day (BID) | INTRAVENOUS | Status: DC
  Administered 2021-06-15 – 2021-06-17 (×6): 500 mg via INTRAVENOUS
  Filled 2021-06-15 (×7): qty 100

## 2021-06-15 MED ORDER — CHLORHEXIDINE GLUCONATE 0.12 % MT SOLN
15.0000 mL | Freq: Two times a day (BID) | OROMUCOSAL | Status: DC
  Administered 2021-06-16 – 2021-06-18 (×5): 15 mL via OROMUCOSAL
  Filled 2021-06-15 (×5): qty 15

## 2021-06-15 MED ORDER — BUPROPION HCL 75 MG PO TABS
150.0000 mg | ORAL_TABLET | Freq: Two times a day (BID) | ORAL | Status: DC
  Administered 2021-06-15 – 2021-06-18 (×6): 150 mg via ORAL
  Filled 2021-06-15 (×6): qty 2

## 2021-06-15 NOTE — Consult Note (Signed)
NAME: Robert Le  ?DOB: February 03, 1940  ?MRN: [id number]  ?Date/Time: 06/15/2021 6:09 PM ? ?REQUESTING PROVIDER: Dr.Shah ?Subjective:  ?REASON FOR CONSULT: E.coli bacteremia ??No h/o available from patient due to dementia. Chart reviewed ?Robert Le is a 81 y.o. with a history of acute cholecystitis, perc cholecystostomy, dementia . LT THA, left nephrectomypresented from memory unit [place] with fever ?Pt was in [facility] in march 2023 with acute cholecystitis and underwent perc cholecystostomy and after 4 days of Iv was discharged on  augmentin. He was followed as OP and had cholecystotomy drain injection on 5/11 and it demonstrated patent cystic and common bile ducts. The drain was exchanged for a new pigtail catheter which was capped due to patency of the cystic duct and common bileducts ?Vitals in ED 100.6, BP 141/83, pulse 112, RR 23 ?Labs showed wbc of 15.9, Hb 15.5, PLT 196, cr 1.08 ?Blood culture sent-  ?The drain was uncapped and connected to a bag ? ?Past Medical History:  ?Diagnosis Date  ? Alzheimer disease ([place])   ? Anxiety   ? Arthritis   ? Chronic kidney disease   ? L TOTAL NEPHRECTOMY  ? Hypertension   ?  ?Past Surgical History:  ?Procedure Laterality Date  ? FRACTURE SURGERY    ? [place]  ? IR EXCHANGE BILIARY DRAIN  06/10/2021  ? IR PERC CHOLECYSTOSTOMY  04/17/2021  ? 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 TOTAL HIP ARTHROPLASTY Left 08/20/2013  ? Procedure: LEFT TOTAL HIP ARTHROPLASTY ANTERIOR APPROACH;  Surgeon: Mauri Pole, MD;  Location: [facility];  Service: Orthopedics;  Laterality: Left;  ?  ?Social History  ? ?Socioeconomic History  ? Marital status: Divorced  ?  Spouse name: Not on file  ? Number of children: 3  ? Years of education: Not on file  ? Highest education level: Not on file  ?Occupational History  ? Occupation: Tax adviser  ?  Comment: Retired  ?Tobacco Use  ? Smoking status: Never  ? Smokeless tobacco: Never  ?Substance and Sexual  Activity  ? Alcohol use: No  ? Drug use: No  ? Sexual activity: Not Currently  ?Other Topics Concern  ? Not on file  ?Social History Narrative  ? Not sure about advanced directives  ? Would want sons to make decisions  ? Would accept resuscitation attempts  ? Not sure about tube feeds  ? ?Social Determinants of Health  ? ?Financial Resource Strain: Not on file  ?Food Insecurity: Not on file  ?Transportation Needs: Not on file  ?Physical Activity: Not on file  ?Stress: Not on file  ?Social Connections: Not on file  ?Intimate Partner Violence: Not on file  ?  ?Family History  ?Problem Relation Age of Onset  ? Heart disease Father   ? Arthritis Brother   ? Arthritis Brother   ? Cancer Neg Hx   ? Diabetes Neg Hx   ? ?Allergies  ?Allergen Reactions  ? Donepezil Other (See Comments)  ?  Increased confusion ?Still listed on pt's current med list as of 06/14/2021  ? Sulfites   ?  Unable to recall reaction or specific sulfa drug  ? ?I? ?Current Facility-Administered Medications  ?Medication Dose Route Frequency Provider Last Rate Last Admin  ? acetaminophen (TYLENOL) tablet 650 mg  650 mg Oral Q6H PRN Para Skeans, MD      ? Or  ? acetaminophen (TYLENOL) suppository 650 mg  650 mg Rectal  Q6H PRN Para Skeans, MD      ? apixaban [facility]) tablet 5 mg  5 mg Oral BID Max Sane, MD   5 mg at 06/15/21 1547  ? buPROPion [facility]) tablet 150 mg  150 mg Oral BID Max Sane, MD      ? cefTRIAXone (ROCEPHIN) 2 g in sodium chloride 0.9 % 100 mL IVPB  2 g Intravenous Q24H Max Sane, MD 200 mL/hr at 06/15/21 1117 2 g at 06/15/21 1117  ? donepezil (ARICEPT) tablet 5 mg  5 mg Oral Daily Para Skeans, MD   5 mg at 06/15/21 4010  ? DULoxetine (CYMBALTA) DR capsule 60 mg  60 mg Oral Q breakfast Renda Rolls, RPH   60 mg at 06/15/21 2725  ? And  ? DULoxetine (CYMBALTA) DR capsule 30 mg  30 mg Oral q1600 Renda Rolls, RPH   30 mg at 06/15/21 1547  ? lactated ringers infusion   Intravenous Continuous Para Skeans, MD 75 mL/hr at  06/15/21 1116 New Bag at 06/15/21 1116  ? memantine (NAMENDA) tablet 10 mg  10 mg Oral BID Para Skeans, MD   10 mg at 06/15/21 3664  ? metroNIDAZOLE (FLAGYL) IVPB 500 mg  500 mg Intravenous Q12H Manuella Ghazi, Vipul, MD 100 mL/hr at 06/15/21 1117 500 mg at 06/15/21 1117  ? morphine (PF) 2 MG/ML injection 2 mg  2 mg Intravenous Q4H PRN Para Skeans, MD      ? pantoprazole (PROTONIX) injection 40 mg  40 mg Intravenous Q12H Para Skeans, MD   40 mg at 06/15/21 4034  ? sodium chloride flush (NS) 0.9 % injection 3 mL  3 mL Intravenous Q12H Para Skeans, MD   3 mL at 06/14/21 2315  ?  ? ?Abtx:  ?Anti-infectives (From admission, onward)  ? ? Start     Dose/Rate Route Frequency Ordered Stop  ? 06/15/21 1000  cefTRIAXone (ROCEPHIN) 2 g in sodium chloride 0.9 % 100 mL IVPB       ? 2 g ?200 mL/hr over 30 Minutes Intravenous Every 24 hours 06/15/21 0935    ? 06/15/21 1000  metroNIDAZOLE (FLAGYL) IVPB 500 mg       ? 500 mg ?100 mL/hr over 60 Minutes Intravenous Every 12 hours 06/15/21 0935    ? 06/14/21 1900  vancomycin (VANCOREADY) IVPB 1500 mg/300 mL       ? 1,500 mg ?150 mL/hr over 120 Minutes Intravenous  Once 06/14/21 1845 06/14/21 2327  ? 06/14/21 1845  ceFEPIme (MAXIPIME) 2 g in sodium chloride 0.9 % 100 mL IVPB       ? 2 g ?200 mL/hr over 30 Minutes Intravenous  Once 06/14/21 1835 06/14/21 1922  ? 06/14/21 1845  metroNIDAZOLE (FLAGYL) IVPB 500 mg       ? 500 mg ?100 mL/hr over 60 Minutes Intravenous  Once 06/14/21 1835 06/14/21 1951  ? 06/14/21 1845  vancomycin (VANCOCIN) IVPB 1000 mg/200 mL premix  Status:  Discontinued       ? 1,000 mg ?200 mL/hr over 60 Minutes Intravenous  Once 06/14/21 1835 06/14/21 1845  ? ?  ? ? ?REVIEW OF SYSTEMS:  ?NA ?Objective:  ?VITALS:  ?BP 119/88   Pulse 68   Temp 98.5 ?F (36.9 ?C) (Oral)   Resp 16   Ht '5\' 7"'$  (1.702 m)   Wt 72.6 kg   SpO2 98%   BMI 25.06 kg/m?  ?LDA ?Rt upper quadrant drain ? ?PHYSICAL EXAM:  ?General:  awake  confused, not oriented in place or time- knows his name ?Head:  Normocephalic, without obvious abnormality, atraumatic. ?Eyes: Conjunctivae clear, anicteric sclerae. Pupils are equal ?ENT Nares normal. No drainage or sinus tenderness. ?Lips, mucosa, and tongue normal. No Thrush ?Neck: Supple, symmetrical, no adenopathy, thyroid: non tender ?no carotid bruit and no JVD. ?Back: No CVA tenderness. ?Lungs: Clear to auscultation bilaterally. No Wheezing or Rhonchi. No rales. ?Heart: Regular rate and rhythm, no murmur, rub or gallop. ?Abdomen: Soft, non-tender,not distended.  ?Rt upper quadrant drain- bilious fluid ?Bowel sounds normal. No masses ?Extremities: atraumatic, no cyanosis. No edema. No clubbing ?Skin: No rashes or lesions. Or bruising ?Lymph: Cervical, supraclavicular normal. ?Neurologic: Grossly non-focal ?Pertinent Labs ?Lab Results ?CBC ?   ?Component Value Date/Time  ? WBC 11.8 (H) 06/15/2021 1210  ? RBC 4.37 06/15/2021 1210  ? HGB 13.0 06/15/2021 1210  ? HCT 40.2 06/15/2021 1210  ? PLT 171 06/15/2021 1210  ? MCV 92.0 06/15/2021 1210  ? MCH 29.7 06/15/2021 1210  ? MCHC 32.3 06/15/2021 1210  ? RDW 14.6 06/15/2021 1210  ? LYMPHSABS 0.4 (L) 06/14/2021 1828  ? MONOABS 0.8 06/14/2021 1828  ? EOSABS 0.0 06/14/2021 1828  ? BASOSABS 0.1 06/14/2021 1828  ? ? ? ?  Latest Ref Rng & Units 06/15/2021  ? 10:54 AM 06/14/2021  ?  6:28 PM 04/20/2021  ?  5:22 AM  ?CMP  ?Glucose 70 - 99 mg/dL 90   115   109    ?BUN 8 - 23 mg/dL '14   18   20    '$ ?Creatinine 0.61 - 1.24 mg/dL 0.99   1.08   1.08    ?Sodium 135 - 145 mmol/L 139   141   142    ?Potassium 3.5 - 5.1 mmol/L 4.3   3.9   3.2    ?Chloride 98 - 111 mmol/L 104   105   107    ?CO2 22 - 32 mmol/L '23   27   27    '$ ?Calcium 8.9 - 10.3 mg/dL 8.9   9.1   8.5    ?Total Protein 6.5 - 8.1 g/dL 5.4   6.2     ?Total Bilirubin 0.3 - 1.2 mg/dL 0.8   1.0     ?Alkaline Phos 38 - 126 U/L 83   117     ?AST 15 - 41 U/L 73   169     ?ALT 0 - 44 U/L 106   179     ? ? ? ? ?Microbiology: ?Recent Results (from the past 240 hour(s))  ?Blood Culture (routine x 2)      Status: None (Preliminary result)  ? Collection Time: 06/14/21  6:28 PM  ? Specimen: BLOOD  ?Result Value Ref Range Status  ? Specimen Description   Final  ?  BLOOD BLOOD LEFT FOREARM ?Performed at [facility]

## 2021-06-15 NOTE — ED Notes (Signed)
Spoke with a staff member from [facility] and provided an update on pt's status. Pt is to be admitted for sepsis. ?

## 2021-06-15 NOTE — Assessment & Plan Note (Signed)
Resume Eliquis.  He was diagnosed with left lower extremity DVT in March 2023 ?

## 2021-06-15 NOTE — Sepsis Progress Note (Signed)
Sepsis bundle complete.   ?

## 2021-06-15 NOTE — Assessment & Plan Note (Signed)
Likely due to sepsis with underlying Alzheimer's dementia ?

## 2021-06-15 NOTE — ED Notes (Signed)
Pt's son mike called by this RN to update on pt's condition and POC. ?

## 2021-06-15 NOTE — TOC Initial Note (Signed)
Transition of Care (TOC) - Initial/Assessment Note  ? ? ?Patient Details  ?Name: Robert Le ?MRN: [id number] ?Date of Birth: Apr 04, 1940 ? ?Transition of Care (TOC) CM/SW Contact:    ?Beverly Sessions, RN ?Phone Number: ?06/15/2021, 1:51 PM ? ?Clinical Narrative:                 ? ?Patient admitted from [facility] ?Seth Bake at [facility] to check to see if patient needs PT eval.  Per Seth Bake patient can ambulate with a walker at baseline ? ?VM left for son Merry Proud.  Spoke with son [name] who confirmed plan for patient to return to [facility] at discharge ? ?TOC to complete Fl2 after nursing completes initial assessment  ?Expected Discharge Plan: Memory Care ?Barriers to Discharge: Continued Medical Work up ? ? ?Patient Goals and CMS Choice ?  ?  ?  ? ?Expected Discharge Plan and Services ?Expected Discharge Plan: Memory Care ?  ?  ?  ?Living arrangements for the past 2 months: [place] (Memory care) ?                ?  ?  ?  ?  ?  ?  ?  ?  ?  ?  ? ?Prior Living Arrangements/Services ?Living arrangements for the past 2 months: [place] (Memory care) ?Lives with:: Facility Resident ?  ?       ?  ?  ?  ?  ? ?Activities of Daily Living ?  ?  ? ?Permission Sought/Granted ?  ?  ?   ?   ?   ?   ? ?Emotional Assessment ?  ?  ?  ?  ?Alcohol / Substance Use: Not Applicable ?Psych Involvement: No (comment) ? ?Admission diagnosis:  AMS (altered mental status) [R41.82] ?Sepsis, due to unspecified organism, unspecified whether acute organ dysfunction present ([place]) [A41.9] ?Patient Active Problem List  ? Diagnosis Date Noted  ? History of DVT (deep vein thrombosis) 06/15/2021  ? Acute cholecystitis 04/17/2021  ? Sepsis ([place]) 04/17/2021  ? Acute metabolic encephalopathy 63/89/3734  ? 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 Alzheimer's dementia without behavioral disturbance ([place]) 12/21/2015  ? History of hypertension 04/21/2015  ? Insomnia 04/21/2015  ? ?PCP:  Venia Carbon,  MD ?Pharmacy:   ?[facility] [place] ?[place] ?[place] [address] ?Phone: [phone] Fax: [phone] ? ?[id number] [address] ?[place] [address] ?Phone: [phone] Fax: [phone] ? ?[place] [address] ?[address] ?[place] [address] ?Phone: [phone] Fax: [phone] ? ? ? ? ?Social Determinants of Health (SDOH) Interventions ?  ? ?Readmission Risk Interventions ?   ? View : No data to display.  ?  ?  ?  ? ? ? ?

## 2021-06-15 NOTE — Progress Notes (Signed)
? ? ?Referring Physician(s): ?Dr. Christian Mate ? ?Supervising Physician: Markus Daft ? ?Patient Status:  [facility] - In-pt ? ?Reason for visit: ?Acalculous cholecystitis s/p successful placement of percutaneous cholecystostomy drain in IR 3/21 ? ?S/p cholangiogram 5/11 with patent cystic and CBD, with successful exchange of drain and the drain was capped. ? ?Admitted 5/26 with AMS, fever and sepsis  ? ?Subjective: ?Patient denies any pain at drain site, but appears confused and unable to provide detailed history. ? ?Allergies: ?Donepezil and Sulfites ? ?Medications: ?Prior to Admission medications   ?Medication Sig Start Date End Date Taking? Authorizing Provider  ?apixaban (ELIQUIS) 5 MG TABS tablet Take 1 tablet (5 mg total) by mouth 2 (two) times daily. 04/27/21 07/26/21 Yes Enzo Bi, MD  ?buPROPion (WELLBUTRIN) 75 MG tablet Take 150 mg by mouth 2 (two) times daily.   Yes [provider]  ?DULoxetine (CYMBALTA) 30 MG capsule Take 30-60 mg by mouth 2 (two) times daily. 60 mg every morning and 30 mg daily at 4 pm   Yes [provider]  ?gabapentin (NEURONTIN) 100 MG capsule Take 200 mg by mouth 2 (two) times daily. 04/23/21  Yes [provider]  ?HYDROcodone-acetaminophen (NORCO/VICODIN) 5-325 MG tablet Take 1 tablet by mouth every 6 (six) hours as needed for moderate pain.   Yes [provider]  ?memantine (NAMENDA) 10 MG tablet Take 10 mg by mouth 2 (two) times daily. 02/23/21  Yes [provider]  ?Multiple Vitamin (MULTIVITAMIN WITH MINERALS) TABS tablet Take 1 tablet by mouth daily. 05/05/16  Yes Venia Carbon, MD  ?acetaminophen (TYLENOL) 500 MG tablet Take 500 mg by mouth 3 (three) times daily as needed for mild pain.    [provider]  ?apixaban (ELIQUIS) 5 MG TABS tablet Take 2 tablets (10 mg total) by mouth 2 (two) times daily for 7 days. 04/20/21 04/27/21  Enzo Bi, MD  ?buPROPion [facility]) 100 MG 12 hr tablet Take 100 mg by mouth 2 (two) times  daily. ?Patient not taking: Reported on 06/15/2021 02/23/21   [provider]  ?donepezil (ARICEPT) 5 MG tablet Take 5 mg by mouth daily. 02/23/21   [provider]  ?gabapentin (NEURONTIN) 600 MG tablet Take 600 mg by mouth daily. 04/23/21   [provider]  ?polyethylene glycol (MIRALAX / GLYCOLAX) 17 g packet Take 17 g by mouth every 3 (three) days.    [provider]  ? ?Vital Signs: ?BP 119/88   Pulse 68   Temp 98.5 ?F (36.9 ?C) (Oral)   Resp 16   Ht '5\' 7"'$  (1.702 m)   Wt 160 lb (72.6 kg)   SpO2 98%   BMI 25.06 kg/m?  ? ?Physical Exam ? ?General: Awake, NAD ?Abd: Soft, NT, ND, RUQ drain without dressing and capped ? ?Imaging: ?CT HEAD WO CONTRAST (5MM) ? ?Result Date: 06/14/2021 ?CLINICAL DATA:  Mental status change, unknown cause EXAM: CT HEAD WITHOUT CONTRAST TECHNIQUE: Contiguous axial images were obtained from the base of the skull through the vertex without intravenous contrast. RADIATION DOSE REDUCTION: This exam was performed according to the departmental dose-optimization program which includes automated exposure control, adjustment of the mA and/or kV according to patient size and/or use of iterative reconstruction technique. COMPARISON:  04/17/2021 FINDINGS: Brain: There is atrophy and chronic small vessel disease changes. No acute intracranial abnormality. Specifically, no hemorrhage, hydrocephalus, mass lesion, acute infarction, or significant intracranial injury. Vascular: No hyperdense vessel or unexpected calcification. Skull: No acute calvarial abnormality. Sinuses/Orbits: No acute findings  Other: None IMPRESSION: Atrophy, chronic microvascular disease. No acute intracranial abnormality. Electronically Signed   By: Rolm Baptise M.D.   On: 06/14/2021 20:24  ? ?CT ABDOMEN PELVIS W CONTRAST ? ?Result Date: 06/14/2021 ?CLINICAL DATA:  Abdominal pain, acute, nonlocalized. Recent percutaneous cholecystostomy tube for acalculous cholecystitis with drain exchange  06/10/2021. EXAM: CT ABDOMEN AND PELVIS WITH CONTRAST TECHNIQUE: Multidetector CT imaging of the abdomen and pelvis was performed using the standard protocol following bolus administration of intravenous contrast. RADIATION DOSE REDUCTION: This exam was performed according to the departmental dose-optimization program which includes automated exposure control, adjustment of the mA and/or kV according to patient size and/or use of iterative reconstruction technique. CONTRAST:  17m OMNIPAQUE IOHEXOL 300 MG/ML  SOLN COMPARISON:  04/17/2021 CT chest, abdomen and pelvis. FINDINGS: Lower chest: Mild curvilinear bibasilar scarring versus atelectasis. Coronary atherosclerosis. Hepatobiliary: Normal liver size. No liver mass. Percutaneous cholecystostomy tube is well positioned with distal pigtail in the gallbladder lumen. Collapsed gallbladder with suggestion of mild diffuse gallbladder wall thickening no radiopaque cholelithiasis. No significant pericholecystic fluid or fat stranding. No biliary ductal dilatation. CBD diameter 2 mm. Pancreas: Normal, with no mass or duct dilation. Spleen: Normal size. No mass. Adrenals/Urinary Tract: Normal adrenals. Surgical absence of the left kidney. Normal right kidney with no right renal masses and no right hydronephrosis. Nondistended bladder with chronic mild diffuse bladder wall thickening. Stomach/Bowel: Normal non-distended stomach. Normal caliber small bowel with no small bowel wall thickening. Appendix not discretely visualized. No pericecal inflammatory changes. Mild sigmoid diverticulosis with no large bowel wall thickening or significant pericolonic fat stranding. Moderate rectal distention by stool. Vascular/Lymphatic: Atherosclerotic nonaneurysmal abdominal aorta. Patent portal, splenic, hepatic and right renal veins. No pathologically enlarged lymph nodes in the abdomen or pelvis. Reproductive: Mildly enlarged prostate. Other: No pneumoperitoneum, ascites or focal fluid  collection. Musculoskeletal: No aggressive appearing focal osseous lesions. 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 IMPRESSION: 1. Percutaneous cholecystostomy tube is well positioned. Collapsed gallbladder with suggestion of mild diffuse gallbladder wall thickening, similar to prior CT and probably chronic. No radiopaque cholelithiasis. No biliary ductal dilatation. 2. Moderate rectal distention by stool, cannot exclude rectal stool impaction. No evidence of bowel obstruction or acute bowel inflammation. Mild sigmoid diverticulosis. 3. 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 4. Aortic Atherosclerosis (ICD10-I70.0). Electronically Signed   By: JIlona SorrelM.D.   On: 06/14/2021 20:37  ? ?DG Chest Port 1 View ? ?Result Date: 06/14/2021 ?CLINICAL DATA:  Possible sepsis EXAM: PORTABLE CHEST 1 VIEW COMPARISON:  04/16/2021 FINDINGS: Cardiac shadow is stable. The overall inspiratory effort is poor. Elevation of the right hemidiaphragm is noted. Cholecystostomy tube is noted in place. Small left pleural effusion is noted stable from the prior study. IMPRESSION: Overall stable appearance of the chest when compared with the prior study. Electronically Signed   By: MInez CatalinaM.D.   On: 06/14/2021 19:03   ? ?Labs: ? ?CBC: ?Recent Labs  ?  04/19/21 ?0612 04/20/21 ?0522 06/14/21 ?1828 06/15/21 ?1210  ?WBC 8.1 8.5 15.9* 11.8*  ?HGB 13.8 13.8 15.5 13.0  ?HCT 42.3 41.7 48.0 40.2  ?PLT 230 262 196 171  ? ? ?COAGS: ?Recent Labs  ?  06/14/21 ?1828  ?INR 1.2  ?APTT 31  ? ? ?BMP: ?Recent Labs  ?  04/19/21 ?0612 04/20/21 ?0522 06/14/21 ?1828 06/15/21 ?1054  ?NA 142 142 141 139  ?K 3.7 3.2* 3.9 4.3  ?CL 108  107 105 104  ?CO2 '28 27 27 23  '$ ?GLUCOSE 102* 109* 115* 90  ?BUN '23 20 18 14  '$ ?CALCIUM 8.6* 8.5* 9.1 8.9  ?CREATININE 0.96 1.08 1.08 0.99  ?GFRNONAA >60 >60 >60 >60  ? ? ?LIVER FUNCTION TESTS: ?Recent Labs  ?   04/17/21 ?6546 04/18/21 ?0401 06/14/21 ?1828 06/15/21 ?1054  ?BILITOT 1.2 1.0 1.0 0.8  ?AST 30 24 169* 73*  ?ALT 35 31 179* 106*  ?ALKPHOS 50 35 465 68  ?PROT 5.5* 5.6* 6.2* 5.4*  ?ALBUMIN 2.7* 2.5* 3.6 3.0*  ? ? ?A

## 2021-06-15 NOTE — Assessment & Plan Note (Signed)
Attribute to metabolic encephalopathy, infection, CT head done initially is negative for any acute intracranial abnormality. ?Goal will be to treat the underlying etiology.  Supportive care with oxygenation and IV fluids.  Neurochecks. ?

## 2021-06-15 NOTE — ED Notes (Signed)
Surgery at bedside.

## 2021-06-15 NOTE — Progress Notes (Addendum)
?Progress Note ? ? ?Patient: Robert Le FVC:[id number] DOB: 1940/02/22 DOA: 06/14/2021     1 ?DOS: the patient was seen and examined on 06/15/2021 ?  ?Brief hospital course: ?81 year old male with a history of Alzheimer's disease admitted for E. coli sepsis ? ?5/16: BCID positive for E. coli, status post uncapped and flushing of catheter under IR with immediate return of bile.  ID consult ? ? ?Assessment and Plan: ?Alzheimer's dementia without behavioral disturbance ([name]) ?Continue home medications including Wellbutrin, Aricept, Cymbalta and Namenda ? ?Consult palliative care for goals of care discussion ? ?Sepsis ([place]) ?BCID positive for E. coli ?Change antibiotic to IV Rocephin and Flagyl ?status post uncapped and flushing of catheter under IR with immediate return of bile.  ID consult ?Surgery following and still planning for gallbladder surgery in June once he completes 3 months of Eliquis for DVT that he had in March 2023 ?  ? ? ?Acute cholecystitis ?Abnormal lft. ? ?  Latest Ref Rng & Units 06/14/2021  ?  6:28 PM 04/20/2021  ?  5:22 AM 04/19/2021  ?  6:12 AM  ?CMP  ?Glucose 70 - 99 mg/dL 115   109   102    ?BUN 8 - 23 mg/dL '18   20   23    '$ ?Creatinine 0.61 - 1.24 mg/dL 1.08   1.08   0.96    ?Sodium 135 - 145 mmol/L 141   142   142    ?Potassium 3.5 - 5.1 mmol/L 3.9   3.2   3.7    ?Chloride 98 - 111 mmol/L 105   107   108    ?CO2 22 - 32 mmol/L '27   27   28    '$ ?Calcium 8.9 - 10.3 mg/dL 9.1   8.5   8.6    ?Total Protein 6.5 - 8.1 g/dL 6.2      ?Total Bilirubin 0.3 - 1.2 mg/dL 1.0      ?Alkaline Phos 38 - 126 U/L 117      ?AST 15 - 41 U/L 169      ?ALT 0 - 44 U/L 179      ? ? ?Currently percutaneous cholecystostomy tube in place. ?We will defer per general surgery recommendations for replacement versus cholecystectomy. ? ?Acute metabolic encephalopathy ?Likely due to sepsis with underlying Alzheimer's dementia ? ?History of DVT (deep vein thrombosis) ?Resume Eliquis.  He was diagnosed with left lower extremity  DVT in March 2023 ? ? ? ? ?  ? ?Subjective: Pleasantly confused.  Does not offer any complaints ? ?Physical Exam: ?Vitals:  ? 06/15/21 0815 06/15/21 0830 06/15/21 0922 06/15/21 1004  ?BP:   110/74 119/88  ?Pulse: 73 72 70 68  ?Resp:   16 16  ?Temp:   98.2 ?F (36.8 ?C) 98.5 ?F (36.9 ?C)  ?TempSrc:   Oral Oral  ?SpO2: 100% 100% 100% 98%  ?Weight:      ?Height:      ? ?81 year old cachectic male lying in the bed pleasantly confused ?Lungs clear to auscultation bilaterally ?Cardiovascular regular rate and rhythm ?Abdomen soft, benign, PERC Coley drain in place with no surrounding erythema or drainage.  Tube is capped ?Neuro awake, nonfocal.  Patient does not follow much commands and seem to be pleasantly confused likely from his dementia and at baseline ?Psych normal mood and affect ?Skin no rash or lesion except the [facility] Coley drain on right upper lateral abdomen ? ?Data Reviewed: ? ?BCID growing E. coli ? ?Family Communication:  Son is updated over phone ? ?Disposition: ?Status is: Inpatient ?Remains inpatient appropriate because: Sepsis management ? ? Planned Discharge Destination: Skilled nursing facility ? ? ? DVT prophylaxis-Eliquis ?Time spent: 35 minutes ? ?Author: ?Max Sane, MD ?06/15/2021 1:51 PM ? ?For on call review [email or web address].  ?

## 2021-06-15 NOTE — Assessment & Plan Note (Addendum)
Continue home medications including Wellbutrin, Aricept, Cymbalta and Namenda ? ?Consult palliative care for goals of care discussion ?

## 2021-06-15 NOTE — Assessment & Plan Note (Addendum)
Abnormal lft. ? ?  Latest Ref Rng & Units 06/14/2021  ?  6:28 PM 04/20/2021  ?  5:22 AM 04/19/2021  ?  6:12 AM  ?CMP  ?Glucose 70 - 99 mg/dL 115   109   102    ?BUN 8 - 23 mg/dL '18   20   23    '$ ?Creatinine 0.61 - 1.24 mg/dL 1.08   1.08   0.96    ?Sodium 135 - 145 mmol/L 141   142   142    ?Potassium 3.5 - 5.1 mmol/L 3.9   3.2   3.7    ?Chloride 98 - 111 mmol/L 105   107   108    ?CO2 22 - 32 mmol/L '27   27   28    '$ ?Calcium 8.9 - 10.3 mg/dL 9.1   8.5   8.6    ?Total Protein 6.5 - 8.1 g/dL 6.2      ?Total Bilirubin 0.3 - 1.2 mg/dL 1.0      ?Alkaline Phos 38 - 126 U/L 117      ?AST 15 - 41 U/L 169      ?ALT 0 - 44 U/L 179      ? ? ?Currently percutaneous cholecystostomy tube in place. ?We will defer per general surgery recommendations for replacement versus cholecystectomy. ?

## 2021-06-15 NOTE — Sepsis Progress Note (Signed)
Following per sepsis protocol   

## 2021-06-15 NOTE — Consult Note (Signed)
[facility] ?SURGICAL CONSULTATION NOTE (initial) - cpt: [phone] ? ? ?HISTORY OF PRESENT ILLNESS (HPI):  ?81 y.o. male known to our service following an admission in March of this year for cholecystitis and sepsis. As such, he had percutaneous cholecystostomy tube placed on 03/18. Unfortunately, hospitalization was complicated by DVT and was placed on Eliquis. He followed up with Dr Hampton Abbot on 04/12 as was doing much better. He also followed up with IR on 05/11 for drain check. During this appointment, he had his cholecystostomy tube exchanged and then was capped. Unfortunately, he presented to the ED yesterday for complaints of fever. He does have a history of dementia which makes history challenging. Reportedly became harder to arouse at [facility] around 1500 and as such, EMS was called. Further history is limited. Work up in the ED revealed a normal lactic acid level to 1.9, leukocytosis to 15.9K, renal function normal with sCr - 1.08, slight AST/ALT elevation but normal bilirubin level, and UA was without gross evidence of UTI. He did have BCx taken which E coli and Enterobacterales. He did also have CT Abdomen/Pelvis which was relatively unremarkable and did show percutaneous cholecystostomy in good position within decompressed GB. No acute changes noted. He was ultimately admitted to the medicine service for bacteremia and suspected sepsis thought to be secondary to biliary etiology.  ? ?Surgery is consulted by emergency medicine physician Dr. Acquanetta Belling in this context for evaluation and management of his percutaneous cholecystostomy tube in the setting of sepsis and previous cholecystitis. ? ?PAST MEDICAL HISTORY (PMH):  ?Past Medical History:  ?Diagnosis Date  ? Alzheimer disease ([place])   ? Anxiety   ? Arthritis   ? Chronic kidney disease   ? L TOTAL NEPHRECTOMY  ? Hypertension   ?  ? ?PAST SURGICAL HISTORY ([place]):  ?Past Surgical History:  ?Procedure Laterality Date  ? FRACTURE SURGERY    ?  [place]  ? IR EXCHANGE BILIARY DRAIN  06/10/2021  ? IR PERC CHOLECYSTOSTOMY  04/17/2021  ? 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 TOTAL HIP ARTHROPLASTY Left 08/20/2013  ? Procedure: LEFT TOTAL HIP ARTHROPLASTY ANTERIOR APPROACH;  Surgeon: Mauri Pole, MD;  Location: [facility];  Service: Orthopedics;  Laterality: Left;  ?  ? ?MEDICATIONS:  ?Prior to Admission medications   ?Medication Sig Start Date End Date Taking? Authorizing Provider  ?acetaminophen (TYLENOL) 500 MG tablet Take 500 mg by mouth 3 (three) times daily as needed for mild pain.    [provider]  ?apixaban (ELIQUIS) 5 MG TABS tablet Take 2 tablets (10 mg total) by mouth 2 (two) times daily for 7 days. 04/20/21 04/27/21  Enzo Bi, MD  ?apixaban (ELIQUIS) 5 MG TABS tablet Take 1 tablet (5 mg total) by mouth 2 (two) times daily. 04/27/21 07/26/21  Enzo Bi, MD  ?buPROPion ER [facility] SR) 100 MG 12 hr tablet Take 100 mg by mouth 2 (two) times daily. 02/23/21   [provider]  ?donepezil (ARICEPT) 5 MG tablet Take 5 mg by mouth daily. 02/23/21   [provider]  ?DULoxetine (CYMBALTA) 30 MG capsule Take 30-60 mg by mouth 2 (two) times daily. 60 mg every morning and 30 mg daily at 4 pm    [provider]  ?gabapentin (NEURONTIN) 300 MG capsule Take 300 mg by mouth 2 (two) times daily. 03/25/21   [provider]  ?HYDROcodone-acetaminophen (NORCO/VICODIN) 5-325 MG tablet Take 1 tablet by mouth every  6 (six) hours as needed for moderate pain.    [provider]  ?memantine (NAMENDA) 10 MG tablet Take 10 mg by mouth 2 (two) times daily. 02/23/21   [provider]  ?Multiple Vitamin (MULTIVITAMIN WITH MINERALS) TABS tablet Take 1 tablet by mouth daily. 05/05/16   Venia Carbon, MD  ?polyethylene glycol (MIRALAX / GLYCOLAX) 17 g packet Take 17 g by mouth every 3 (three) days.    [provider]  ?  ? ?ALLERGIES:  ?Allergies  ?Allergen Reactions  ? Donepezil  Other (See Comments)  ?  Increased confusion  ? Sulfites   ?  Unable to recall reaction or specific sulfa drug  ?  ? ?SOCIAL HISTORY:  ?Social History  ? ?Socioeconomic History  ? Marital status: Divorced  ?  Spouse name: Not on file  ? Number of children: 3  ? Years of education: Not on file  ? Highest education level: Not on file  ?Occupational History  ? Occupation: Tax adviser  ?  Comment: Retired  ?Tobacco Use  ? Smoking status: Never  ? Smokeless tobacco: Never  ?Substance and Sexual Activity  ? Alcohol use: No  ? Drug use: No  ? Sexual activity: Not Currently  ?Other Topics Concern  ? Not on file  ?Social History Narrative  ? Not sure about advanced directives  ? Would want sons to make decisions  ? Would accept resuscitation attempts  ? Not sure about tube feeds  ? ?Social Determinants of Health  ? ?Financial Resource Strain: Not on file  ?Food Insecurity: Not on file  ?Transportation Needs: Not on file  ?Physical Activity: Not on file  ?Stress: Not on file  ?Social Connections: Not on file  ?Intimate Partner Violence: Not on file  ?  ? ?FAMILY HISTORY:  ?Family History  ?Problem Relation Age of Onset  ? Heart disease Father   ? Arthritis Brother   ? Arthritis Brother   ? Cancer Neg Hx   ? Diabetes Neg Hx   ?  ? ? ?REVIEW OF SYSTEMS:  ?Review of Systems  ?Unable to perform ROS: Dementia  ? ?VITAL SIGNS:  ?Temp:  [99 ?F (37.2 ?C)-101.6 ?F (38.7 ?C)] 99 ?F (37.2 ?C) (05/15 2130) ?Pulse Rate:  [70-112] 71 (05/16 0700) ?Resp:  [13-28] 17 (05/16 0700) ?BP: (104-141)/(60-94) 115/94 (05/16 0700) ?SpO2:  [93 %-100 %] 100 % (05/16 0700) ?Weight:  [72.6 kg] 72.6 kg (05/15 1827)     Height: '5\' 7"'$  (170.2 cm) Weight: 72.6 kg BMI (Calculated): 25.05  ? ?INTAKE/OUTPUT:  ?05/15 0701 - 05/16 0700 ?In: 1479.2 [IV Piggyback:1479.2] ?Out: -  ? ?PHYSICAL EXAM:  ?Physical Exam ?Vitals and nursing note reviewed.  ?Constitutional:   ?   General: He is not in acute distress. ?   Appearance: Normal appearance. He is not  ill-appearing.  ?   Comments: Patient alert, NAD, confused  ?HENT:  ?   Head: Normocephalic and atraumatic.  ?Eyes:  ?   General: No scleral icterus. ?   Conjunctiva/sclera: Conjunctivae normal.  ?Cardiovascular:  ?   Rate and Rhythm: Normal rate.  ?   Pulses: Normal pulses.  ?Pulmonary:  ?   Effort: Pulmonary effort is normal. No respiratory distress.  ?Abdominal:  ?   General: Abdomen is flat. There is no distension.  ?   Palpations: Abdomen is soft.  ?   Tenderness: There is abdominal tenderness in the right upper quadrant. There is no guarding or rebound.  ?  Comments: Abdomen is soft, he appears primarily tender at drain site in RUQ, non-distended, no rebound/guarding. Percutaneous cholecystostomy tube present in RUQ; this is capped   ?Genitourinary: ?   Comments: Deferred ?Musculoskeletal:  ?   Right lower leg: No edema.  ?   Left lower leg: No edema.  ?Skin: ?   General: Skin is warm and dry.  ?   Coloration: Skin is not jaundiced or pale.  ?   Findings: No erythema.  ?Neurological:  ?   Mental Status: He is alert. Mental status is at baseline.  ?Psychiatric:  ?   Comments: Unable to reliably assess secondary to dementia   ? ? ? ?Labs:  ? ?  Latest Ref Rng & Units 06/14/2021  ?  6:28 PM 04/20/2021  ?  5:22 AM 04/19/2021  ?  6:12 AM  ?CBC  ?WBC 4.0 - 10.5 K/uL 15.9   8.5   8.1    ?Hemoglobin 13.0 - 17.0 g/dL 15.5   13.8   13.8    ?Hematocrit 39.0 - 52.0 % 48.0   41.7   42.3    ?Platelets 150 - 400 K/uL 196   262   230    ? ? ?  Latest Ref Rng & Units 06/14/2021  ?  6:28 PM 04/20/2021  ?  5:22 AM 04/19/2021  ?  6:12 AM  ?CMP  ?Glucose 70 - 99 mg/dL 115   109   102    ?BUN 8 - 23 mg/dL '18   20   23    '$ ?Creatinine 0.61 - 1.24 mg/dL 1.08   1.08   0.96    ?Sodium 135 - 145 mmol/L 141   142   142    ?Potassium 3.5 - 5.1 mmol/L 3.9   3.2   3.7    ?Chloride 98 - 111 mmol/L 105   107   108    ?CO2 22 - 32 mmol/L '27   27   28    '$ ?Calcium 8.9 - 10.3 mg/dL 9.1   8.5   8.6    ?Total Protein 6.5 - 8.1 g/dL 6.2      ?Total  Bilirubin 0.3 - 1.2 mg/dL 1.0      ?Alkaline Phos 38 - 126 U/L 117      ?AST 15 - 41 U/L 169      ?ALT 0 - 44 U/L 179      ? ? ? ?Imaging studies:  ? ?CT Abdomen/Pelvis (06/14/2021) personally reviewed which sh

## 2021-06-15 NOTE — Hospital Course (Signed)
81 year old male with a history of Alzheimer's disease admitted for E. coli sepsis ? ?5/16: BCID positive for E. coli, status post uncapped and flushing of catheter under IR with immediate return of bile.  ID consult ?

## 2021-06-15 NOTE — Assessment & Plan Note (Addendum)
BCID positive for E. coli ?Change antibiotic to IV Rocephin and Flagyl ?status post uncapped and flushing of catheter under IR with immediate return of bile.  ID consult ?Surgery following and still planning for gallbladder surgery in June once he completes 3 months of Eliquis for DVT that he had in March 2023 ?  ? ?

## 2021-06-15 NOTE — Progress Notes (Signed)
PHARMACY - PHYSICIAN COMMUNICATION ?CRITICAL VALUE ALERT - BLOOD CULTURE IDENTIFICATION (BCID) ? ?Robert Le is an 81 y.o. male who presented to [facility] on 06/14/2021 with a chief complaint of altered mental status ? ?Assessment:  blood cultures with GNR, BCID detects E Coli.  Patient with recent acute cholecystitis managed with cholecystostomy tube (placed 3/18) and exchanged 5/11.   ? ?Name of physician (or Provider) Contacted: Dr Manuella Ghazi ? ?Current antibiotics: Vanco/Cefepime ? ?Changes to prescribed antibiotics recommended:  ?Recommendations accepted by provider ? ?Results for orders placed or performed during the hospital encounter of 06/14/21  ?Blood Culture ID Panel (Reflexed) (Collected: 06/14/2021  6:28 PM)  ?Result Value Ref Range  ? Enterococcus faecalis NOT DETECTED NOT DETECTED  ? Enterococcus Faecium NOT DETECTED NOT DETECTED  ? Listeria monocytogenes NOT DETECTED NOT DETECTED  ? Staphylococcus species NOT DETECTED NOT DETECTED  ? Staphylococcus aureus (BCID) NOT DETECTED NOT DETECTED  ? Staphylococcus epidermidis NOT DETECTED NOT DETECTED  ? Staphylococcus lugdunensis NOT DETECTED NOT DETECTED  ? Streptococcus species NOT DETECTED NOT DETECTED  ? Streptococcus agalactiae NOT DETECTED NOT DETECTED  ? Streptococcus pneumoniae NOT DETECTED NOT DETECTED  ? Streptococcus pyogenes NOT DETECTED NOT DETECTED  ? A.calcoaceticus-baumannii NOT DETECTED NOT DETECTED  ? Bacteroides fragilis NOT DETECTED NOT DETECTED  ? Enterobacterales DETECTED (A) NOT DETECTED  ? Enterobacter cloacae complex NOT DETECTED NOT DETECTED  ? Escherichia coli DETECTED (A) NOT DETECTED  ? Klebsiella aerogenes NOT DETECTED NOT DETECTED  ? Klebsiella oxytoca NOT DETECTED NOT DETECTED  ? Klebsiella pneumoniae NOT DETECTED NOT DETECTED  ? Proteus species NOT DETECTED NOT DETECTED  ? Salmonella species NOT DETECTED NOT DETECTED  ? Serratia marcescens NOT DETECTED NOT DETECTED  ? Haemophilus influenzae NOT DETECTED NOT DETECTED  ?  Neisseria meningitidis NOT DETECTED NOT DETECTED  ? Pseudomonas aeruginosa NOT DETECTED NOT DETECTED  ? Stenotrophomonas maltophilia NOT DETECTED NOT DETECTED  ? Candida albicans NOT DETECTED NOT DETECTED  ? Candida auris NOT DETECTED NOT DETECTED  ? Candida glabrata NOT DETECTED NOT DETECTED  ? Candida krusei NOT DETECTED NOT DETECTED  ? Candida parapsilosis NOT DETECTED NOT DETECTED  ? Candida tropicalis NOT DETECTED NOT DETECTED  ? Cryptococcus neoformans/gattii NOT DETECTED NOT DETECTED  ? CTX-M ESBL NOT DETECTED NOT DETECTED  ? Carbapenem resistance IMP NOT DETECTED NOT DETECTED  ? Carbapenem resistance KPC NOT DETECTED NOT DETECTED  ? Carbapenem resistance NDM NOT DETECTED NOT DETECTED  ? Carbapenem resist OXA 48 LIKE NOT DETECTED NOT DETECTED  ? Carbapenem resistance VIM NOT DETECTED NOT DETECTED  ? ? ?Doreene Eland, PharmD, BCPS, BCIDP ?Work Cell: [phone] ?06/15/2021 9:37 AM ? ? ? ?

## 2021-06-15 NOTE — Consult Note (Signed)
Consultation Note Date: 06/15/2021   Patient Name: Robert Le  DOB: [birth year]  MRN: [id number]  Age / Sex: 81 y.o., male  PCP: Venia Carbon, MD Referring Physician: Max Sane, MD  Reason for Consultation:  goals of care  HPI/Patient Profile: 81 y.o. male  with past medical history of Alzheimer's dementia, HTN, and recent hospitalization for cholecystitis- had perc drain placed and surgery delayed to June due to DVT requiring anticoag. He had drain exchanged and capped by IR on 5/11. He was admitted on 06/14/2021 with fever and altered mental status. Workup reveals bacteremia with blood cultures positive for E. Coli. UA and CXR were negative. He is being treated with IV fluids and antibiotics. His drain has been placed to bag drainage. Surgery has been consulted and they have recommended to continue to wait until June for any further surgery. Palliative medicine consulted for Robert Le.   Primary Decision Maker NEXT OF KIN - listed contact is son Robert Le  Discussion: Chart reviewed including labs, imaging, progress notes from this and previous hospitalizations as well as Care Everywhere.   Patient is awake and alert. Most of his speech is intelligible, but it is not related to questions that are asked of him. For example, when I asked his name, he responded with with a laugh and said, "that's the way life is". He is very pleasantly confused.   Per chart review- he resides at [facility] in the [place] unit. Typically he is able to ambulate and is very active. He is incontinent of bladder and bowel.   There is no record of a living will or any advanced care planning discussions to date.   I attempted to call his son, Robert Le. I left a message requesting return call.     SUMMARY OF RECOMMENDATIONS -Continue full code full scope for now -PMT will continue to make attempts to reach family  for further [place] and advanced care planning discussion     Code Status/Advance Care Planning: Full code   Prognosis:   Unable to determine  Discharge Planning: To Be Determined  Primary Diagnoses: Present on Admission:  Sepsis ([place])  Acute cholecystitis  Acute metabolic encephalopathy  Alzheimer's dementia without behavioral disturbance ([place])   Review of Systems  Unable to perform ROS: Dementia   Physical Exam Vitals reviewed.  Constitutional:      Appearance: He is normal weight.  Pulmonary:     Effort: Pulmonary effort is normal.  Neurological:     Mental Status: He is alert.  Psychiatric:     Comments: Pleasantly confused    Vital Signs: BP 119/88   Pulse 68   Temp 98.5 F (36.9 C) (Oral)   Resp 16   Ht '5\' 7"'$  (1.702 m)   Wt 72.6 kg   SpO2 98%   BMI 25.06 kg/m  Pain Scale: 0-10   Pain Score: 0-No pain   SpO2: SpO2: 98 % O2 Device:SpO2: 98 % O2 Flow Rate: .O2 Flow Rate (L/min): 2 L/min  IO: Intake/output  summary:  Intake/Output Summary (Last 24 hours) at 06/15/2021 1428 Last data filed at 06/14/2021 2327 Gross per 24 hour  Intake 1479.2 ml  Output --  Net 1479.2 ml    LBM:   Baseline Weight: Weight: 72.6 kg Most recent weight: Weight: 72.6 kg       Thank you for this consult. Palliative medicine will continue to follow and assist as needed.   Greater than 50%  of this time was spent counseling and coordinating care related to the above assessment and plan.  Signed by: Mariana Kaufman, AGNP-C Palliative Medicine    Please contact Palliative Medicine Team phone at [phone] for questions and concerns.  For individual provider: See Shea Evans

## 2021-06-16 DIAGNOSIS — R7881 Bacteremia: Secondary | ICD-10-CM | POA: Diagnosis not present

## 2021-06-16 DIAGNOSIS — A4151 Sepsis due to Escherichia coli [E. coli]: Secondary | ICD-10-CM | POA: Diagnosis not present

## 2021-06-16 DIAGNOSIS — K810 Acute cholecystitis: Secondary | ICD-10-CM | POA: Diagnosis not present

## 2021-06-16 DIAGNOSIS — B9620 Unspecified Escherichia coli [E. coli] as the cause of diseases classified elsewhere: Secondary | ICD-10-CM | POA: Diagnosis not present

## 2021-06-16 LAB — HEPATIC FUNCTION PANEL
ALT: 76 U/L — ABNORMAL HIGH (ref 0–44)
AST: 45 U/L — ABNORMAL HIGH (ref 15–41)
Albumin: 3.1 g/dL — ABNORMAL LOW (ref 3.5–5.0)
Alkaline Phosphatase: 75 U/L (ref 38–126)
Bilirubin, Direct: 0.1 mg/dL (ref 0.0–0.2)
Indirect Bilirubin: 0.4 mg/dL (ref 0.3–0.9)
Total Bilirubin: 0.5 mg/dL (ref 0.3–1.2)
Total Protein: 5.6 g/dL — ABNORMAL LOW (ref 6.5–8.1)

## 2021-06-16 LAB — BASIC METABOLIC PANEL
Anion gap: 9 (ref 5–15)
BUN: 13 mg/dL (ref 8–23)
CO2: 26 mmol/L (ref 22–32)
Calcium: 8.7 mg/dL — ABNORMAL LOW (ref 8.9–10.3)
Chloride: 101 mmol/L (ref 98–111)
Creatinine, Ser: 0.88 mg/dL (ref 0.61–1.24)
GFR, Estimated: >60 mL/min (ref >60)
Glucose, Bld: 89 mg/dL (ref 70–99)
Potassium: 3.9 mmol/L (ref 3.5–5.1)
Sodium: 136 mmol/L (ref 135–145)

## 2021-06-16 LAB — CBC
HCT: 41.7 % (ref 39.0–52.0)
Hemoglobin: 13.6 g/dL (ref 13.0–17.0)
MCH: 29.9 pg (ref 26.0–34.0)
MCHC: 32.6 g/dL (ref 30.0–36.0)
MCV: 91.6 fL (ref 80.0–100.0)
Platelets: 165 10*3/uL (ref 150–400)
RBC: 4.55 MIL/uL (ref 4.22–5.81)
RDW: 14.2 % (ref 11.5–15.5)
WBC: 7 10*3/uL (ref 4.0–10.5)
nRBC: 0 % (ref 0.0–0.2)

## 2021-06-16 LAB — MRSA NEXT GEN BY PCR, NASAL: MRSA by PCR Next Gen: NOT DETECTED

## 2021-06-16 NOTE — Evaluation (Signed)
Occupational Therapy Evaluation ?Patient Details ?Name: Robert Le ?MRN: [id number] ?DOB: 09/25/1940 ?Today's Date: 06/16/2021 ? ? ?History of Present Illness Pt is an 81 yo male that presented to the ED for fever.  workup showed sepsis, acute cholecystitis ( percutaneous cholecystostomy tube in place, 3/18), PMH of Alzheimers, DVT, acute cholecystitis, L THA, CKD (L total nephrectomy), anxiety, HTN.  ? ?Clinical Impression ?  ?Robert Le was seen for OT/PT co-evaluation this date. Pt lives at [facility] memory care unit. Pt currently requires TOTAL A x2 toileting at bed level with L+R rolling. TOTAL A don/doff B socks and gown at bed level. Pt demonstrates no evidence of learning and appears at baseline from recent hospitalization. Will sign off. Upon hospital discharge, recommend discharge to LT. ?  ? ?Recommendations for follow up therapy are one component of a multi-disciplinary discharge planning process, led by the attending physician.  Recommendations may be updated based on patient status, additional functional criteria and insurance authorization.  ? ?Follow Up Recommendations ? Long-term institutional care without follow-up therapy  ?  ?Assistance Recommended at Discharge Frequent or constant Supervision/Assistance  ?Patient can return home with the following Two people to help with walking and/or transfers;Two people to help with bathing/dressing/bathroom ? ?  ?Functional Status Assessment ? Patient has had a recent decline in their functional status and/or demonstrates limited ability to make significant improvements in function in a reasonable and predictable amount of time  ?Equipment Recommendations ? None recommended by OT  ?  ?Recommendations for Other Services   ? ? ?  ?Precautions / Restrictions Precautions ?Precautions: Fall ?Restrictions ?Weight Bearing Restrictions: No  ? ?  ? ?Mobility Bed Mobility ?Overal bed mobility: Needs Assistance ?Bed Mobility: Supine to Sit, Sit to Supine,  Rolling ?Rolling: +2 for physical assistance, Total assist ?  ?Supine to sit: +2 for physical assistance, Total assist ?Sit to supine: +2 for physical assistance, Total assist ?  ?  ?  ? ?Transfers ?Overall transfer level: Needs assistance ?Equipment used: None ?Transfers: Sit to/from Stand ?Sit to Stand: From elevated surface, Total assist, +2 physical assistance ?  ?  ?  ?  ?  ?  ?  ? ?  ?Balance Overall balance assessment: Needs assistance ?Sitting-balance support: Feet supported ?Sitting balance-Robert Le Scale: Fair ?Sitting balance - Comments: able to sit with CGA ?  ?  ?Standing balance-Robert Le Scale: Zero ?  ?  ?  ?  ?  ?  ?  ?  ?  ?  ?  ?  ?   ? ?ADL either performed or assessed with clinical judgement  ? ?ADL Overall ADL's : Needs assistance/impaired ?  ?  ?  ?  ?  ?  ?  ?  ?  ?  ?  ?  ?  ?  ?  ?  ?  ?  ?  ?General ADL Comments: TOTAL A x2 toileting at bed level. TOTAL A don/doff B socks and gown at bed level  ? ? ? ? ?Pertinent Vitals/Pain Pain Assessment ?Pain Assessment: Faces ?Faces Pain Scale: Hurts little more ?Pain Location: pain signs/symptoms with pericare ?Pain Descriptors / Indicators: Dull, Guarding, Moaning ?Pain Intervention(s): Limited activity within patient's tolerance  ? ? ? ?Hand Dominance   ?  ?Extremity/Trunk Assessment Upper Extremity Assessment ?Upper Extremity Assessment: Difficult to assess due to impaired cognition ?  ?Lower Extremity Assessment ?Lower Extremity Assessment: Difficult to assess due to impaired cognition ?  ?  ?  ?Communication Communication ?Communication: Other (comment) ?  ?  Cognition Arousal/Alertness: Awake/alert ?  ?Overall Cognitive Status: History of cognitive impairments - at baseline ?  ?  ?  ?  ?  ?  ?  ?  ?  ?  ?  ?  ?  ?  ?  ?  ?General Comments: pt oriented to name only. followed no commands during session ?  ?  ?   ?   ?   ? ? ?Home Living Family/patient expects to be discharged to:: Skilled nursing facility ?  ?  ?  ?  ?  ?  ?  ?  ?  ?  ?  ?  ?  ?  ?  ?   ?Additional Comments: [facility] Memory Care Unit ?  ? ?  ?Prior Functioning/Environment Prior Level of Function : Patient poor historian/Family not available ?  ?  ?  ?  ?  ?  ?Mobility Comments: per chart pt mobile at memory care unit using AD however pt unable to answer questions ?ADLs Comments: unsure ?  ? ?  ?  ?OT Problem List: Decreased range of motion;Decreased strength;Decreased activity tolerance ?  ?   ?   ?OT Goals(Current goals can be found in the care plan section) Acute Rehab OT Goals ?Patient Stated Goal: pt unable to participate ?OT Goal Formulation: Patient unable to participate in goal setting  ?   ? ?Co-evaluation PT/OT/SLP Co-Evaluation/Treatment: Yes ?Reason for Co-Treatment: Necessary to address cognition/behavior during functional activity;For patient/therapist safety;To address functional/ADL transfers ?PT goals addressed during session: Mobility/safety with mobility ?OT goals addressed during session: ADL's and self-care ?  ? ?  ?AM-PAC OT "6 Clicks" Daily Activity     ?Outcome Measure Help from another person eating meals?: A Lot ?Help from another person taking care of personal grooming?: Total ?Help from another person toileting, which includes using toliet, bedpan, or urinal?: Total ?Help from another person bathing (including washing, rinsing, drying)?: Total ?Help from another person to put on and taking off regular upper body clothing?: Total ?Help from another person to put on and taking off regular lower body clothing?: Total ?6 Click Score: 7 ?  ?End of Session   ? ?Activity Tolerance: Patient tolerated treatment well ?Patient left: in bed;with call bell/phone within reach;with bed alarm set ? ?OT Visit Diagnosis: Other abnormalities of gait and mobility (R26.89)  ?              ?Time: 9030-0923 ?OT Time Calculation (min): 17 min ?Charges:  OT General Charges ?$OT Visit: 1 Visit ?OT Evaluation ?$OT Eval Low Complexity: 1 Low ?OT Treatments ?$Self Care/Home Management : 8-22  mins ? ?Dessie Coma, M.S. OTR/L  ?06/16/21, 2:59 PM  ?ascom [phone] ? ?

## 2021-06-16 NOTE — Progress Notes (Signed)
[facility] ?SURGICAL PROGRESS NOTE (cpt [phone]) ? ?Hospital Day(s): 2.  ? ?Interval History: Patient seen and examined, no acute events or new complaints overnight. Patient resting in bed; NAD. He reports pain at the drain site. No other complaints. History is limited secondary to dementia but he appears objectively well this AM. His previous leukocytosis is now resolved; 7.0K. Renal function is normal; sCr - 0.88; UO - 550 ccs + unmeasured. No significant electrolyte derangements. Percutaneous cholecystostomy drain reconnected to drainage bag with IR yesterday (05/16); output 70 ccs; bilious. He continues on Rocephin + Flagyl. He is on CLD.  ? ?Review of Systems:  ?Unable to reliably preform to secondary to dementia ? ?Vital signs in last 24 hours: [min-max] current  ?Temp:  [97.3 ?F (36.3 ?C)-99.3 ?F (37.4 ?C)] 97.3 ?F (36.3 ?C) (05/17 0442) ?Pulse Rate:  [67-80] 69 (05/17 0442) ?Resp:  [16-20] 18 (05/17 0442) ?BP: (104-136)/(69-95) 129/93 (05/17 0442) ?SpO2:  [97 %-100 %] 98 % (05/17 0442) ?Weight:  [79.4 kg] 79.4 kg (05/17 0451)     Height: '5\' 7"'$  (170.2 cm) Weight: 79.4 kg BMI (Calculated): 27.41  ? ?Intake/Output last 2 shifts:  ?05/16 0701 - 05/17 0700 ?In: 2402 [P.O.:330; I.V.:1762; IV Piggyback:300] ?Out: 620 [Urine:550; Drains:70]  ? ?Physical Exam:  ?Constitutional: alert, cooperative and no distress  ?HENT: normocephalic without obvious abnormality  ?Eyes: PERRL, EOM's grossly intact and symmetric  ?Respiratory: breathing non-labored at rest  ?Cardiovascular: regular rate and sinus rhythm  ?Gastrointestinal: Soft, no appreciable tenderness, non-distended, no rebound/guarding. Percutaneous cholecystostomy in the RUQ; reconnected to drainage bag; output bilious  ?Musculoskeletal: no edema or wounds, motor and sensation grossly intact, NT  ? ? ?Labs:  ? ?  Latest Ref Rng & Units 06/16/2021  ?  5:57 AM 06/15/2021  ? 12:10 PM 06/14/2021  ?  6:28 PM  ?CBC  ?WBC 4.0 - 10.5 K/uL 7.0   11.8   15.9     ?Hemoglobin 13.0 - 17.0 g/dL 13.6   13.0   15.5    ?Hematocrit 39.0 - 52.0 % 41.7   40.2   48.0    ?Platelets 150 - 400 K/uL 165   171   196    ? ? ?  Latest Ref Rng & Units 06/16/2021  ?  5:57 AM 06/15/2021  ? 10:54 AM 06/14/2021  ?  6:28 PM  ?CMP  ?Glucose 70 - 99 mg/dL 89   90   115    ?BUN 8 - 23 mg/dL '13   14   18    '$ ?Creatinine 0.61 - 1.24 mg/dL 0.88   0.99   1.08    ?Sodium 135 - 145 mmol/L 136   139   141    ?Potassium 3.5 - 5.1 mmol/L 3.9   4.3   3.9    ?Chloride 98 - 111 mmol/L 101   104   105    ?CO2 22 - 32 mmol/L '26   23   27    '$ ?Calcium 8.9 - 10.3 mg/dL 8.7   8.9   9.1    ?Total Protein 6.5 - 8.1 g/dL  5.4   6.2    ?Total Bilirubin 0.3 - 1.2 mg/dL  0.8   1.0    ?Alkaline Phos 38 - 126 U/L  83   117    ?AST 15 - 41 U/L  73   169    ?ALT 0 - 44 U/L  106   179    ? ? ? ?Imaging  studies: No new pertinent imaging studies ? ? ?Assessment/Plan: (ICD-10's: K81.0) ?81 y.o. male admitted with bacteremia and presumed sepsis with history of percutaneous cholecystostomy tube placement on 03/18 secondary to cholecystitis, complicated by pertinent comorbidities including dementia and recent DVT on Eliquis. ? ? - Advance diet as tolerated ? - Appreciate IR assistance with percutaneous cholecystostomy tube management and replacement of drainage bag; monitor output ? - No plans for more urgent surgical intervention. We will continue to plan for outpatient cholecystectomy in June once he has completed treatment for DVT  ?- IV Abx (Rocephin + Flagyl) ?- Monitor abdominal examination; on-going bowel function ?- Pain control prn; antiemetics prn  ?         - DVT prophylaxis; continue Eliquis  ?- Further management per primary service ? ?- Nothing further from surgical perspective. Continue percutaneous cholecystostomy tube for home. He will benefit from another course of Abx for home. We will continue plan for interval cholecystectomy in June once complete treatment for DVT. Follow up in 3-4 weeks.  ?  ?All of the above  findings and recommendations were discussed with the medical team ? ?-- ?Edison Simon, PA-C ?[facility] ?06/16/2021, 7:23 AM ?M-F: 7am - 4pm ? ?

## 2021-06-16 NOTE — Evaluation (Signed)
Physical Therapy Evaluation ?Patient Details ?Name: Robert Le ?MRN: [id number] ?DOB: February 02, 1940 ?Today's Date: 06/16/2021 ? ?History of Present Illness ? Pt is an 81 yo male that presented to the ED for fever.  workup showed sepsis, acute cholecystitis ( percutaneous cholecystostomy tube in place, 3/18), PMH of Alzheimers, DVT, acute cholecystitis, L THA, CKD (L total nephrectomy), anxiety, HTN. ?  ?Clinical Impression ? Pt alert, able to shake PT's hand and repeat his name. Pt did not follow any commands throughout session, 1-2 instances of intelligible speech though inappropriate. Unable to provide PLOF. ? ?The patient did attempt to move RLE towards EOB, but ultimately totalAx2 to come up into sitting. Pt was able to sit with fair balance, CGA. Sit <> Stand twice with totalAx2, pt unable to stand without assist and attempts to return to sitting throughout. Pt rolled with totalAx2 to address BM and pericare. Due to pts cognition, he demonstrated limited ability to participate with therapy meaningfully, recommendation is LTC without follow up therapies, PT to sign off.    ?   ? ?Recommendations for follow up therapy are one component of a multi-disciplinary discharge planning process, led by the attending physician.  Recommendations may be updated based on patient status, additional functional criteria and insurance authorization. ? ?Follow Up Recommendations Long-term institutional care without follow-up therapy ? ?  ?Assistance Recommended at Discharge Frequent or constant Supervision/Assistance  ?Patient can return home with the following ? Two people to help with walking and/or transfers;Two people to help with bathing/dressing/bathroom;Help with stairs or ramp for entrance;Assist for transportation;Assistance with feeding;Assistance with cooking/housework;Direct supervision/assist for financial management ? ?  ?Equipment Recommendations Wheelchair cushion (measurements PT);Wheelchair (measurements  PT);None recommended by PT (hoyer lift)  ?Recommendations for Other Services ?    ?  ?Functional Status Assessment  (pt unable to participate with therapy services due to cognition)  ? ?  ?Precautions / Restrictions Precautions ?Precautions: Fall ?Restrictions ?Weight Bearing Restrictions: No  ? ?  ? ?Mobility ? Bed Mobility ?Overal bed mobility: Needs Assistance ?Bed Mobility: Supine to Sit, Sit to Supine, Rolling ?Rolling: +2 for physical assistance, Total assist ?  ?Supine to sit: +2 for physical assistance, Total assist ?Sit to supine: +2 for physical assistance, Total assist ?  ?  ?  ? ?Transfers ?Overall transfer level: Needs assistance ?Equipment used: None ?Transfers: Sit to/from Stand ?Sit to Stand: From elevated surface, Total assist, +2 physical assistance ?  ?  ?  ?  ?  ?  ?  ? ?Ambulation/Gait ?  ?  ?  ?  ?  ?  ?  ?  ? ?Stairs ?  ?  ?  ?  ?  ? ?Wheelchair Mobility ?  ? ?Modified Rankin (Stroke Patients Only) ?  ? ?  ? ?Balance Overall balance assessment: Needs assistance ?Sitting-balance support: Feet supported ?Sitting balance-Leahy Scale: Fair ?Sitting balance - Comments: able to sit with CGA ?  ?  ?Standing balance-Leahy Scale: Zero ?  ?  ?  ?  ?  ?  ?  ?  ?  ?  ?  ?  ?   ? ? ? ?Pertinent Vitals/Pain Pain Assessment ?Pain Assessment: Faces ?Faces Pain Scale: Hurts little more ?Pain Location: pain signs/symptoms with pericare ?Pain Descriptors / Indicators: Dull, Guarding, Moaning ?Pain Intervention(s): Limited activity within patient's tolerance, Monitored during session, Repositioned  ? ? ?Home Living Family/patient expects to be discharged to:: Skilled nursing facility ?  ?  ?  ?  ?  ?  ?  ?  ?  ?  Additional Comments: [facility]  ?  ?Prior Function Prior Level of Function : Patient poor historian/Family not available ?  ?  ?  ?  ?  ?  ?Mobility Comments: per chart pt mobile at memory care unit using AD however pt unable to answer questions ?ADLs Comments: unsure ?  ? ? ?Hand  Dominance  ?   ? ?  ?Extremity/Trunk Assessment  ? Upper Extremity Assessment ?Upper Extremity Assessment: Difficult to assess due to impaired cognition ?  ? ?Lower Extremity Assessment ?Lower Extremity Assessment: Difficult to assess due to impaired cognition ?  ? ?   ?Communication  ? Communication: Other (comment)  ?Cognition Arousal/Alertness: Awake/alert ?  ?Overall Cognitive Status: History of cognitive impairments - at baseline ?  ?  ?  ?  ?  ?  ?  ?  ?  ?  ?  ?  ?  ?  ?  ?  ?General Comments: pt oriented to name only. followed no commands during session ?  ?  ? ?  ?General Comments   ? ?  ?Exercises    ? ?Assessment/Plan  ?  ?PT Assessment Patient does not need any further PT services  ?PT Problem List   ? ?   ?  ?PT Treatment Interventions     ? ?PT Goals (Current goals can be found in the Care Plan section)  ?  ? ?  ?Frequency   ?  ? ? ?Co-evaluation PT/OT/SLP Co-Evaluation/Treatment: Yes ?Reason for Co-Treatment: Complexity of the patient's impairments (multi-system involvement);For patient/therapist safety;To address functional/ADL transfers;Necessary to address cognition/behavior during functional activity ?PT goals addressed during session: Mobility/safety with mobility;Balance ?OT goals addressed during session: ADL's and self-care ?  ? ? ?  ?AM-PAC PT "6 Clicks" Mobility  ?Outcome Measure Help needed turning from your back to your side while in a flat bed without using bedrails?: Total ?Help needed moving from lying on your back to sitting on the side of a flat bed without using bedrails?: Total ?Help needed moving to and from a bed to a chair (including a wheelchair)?: Total ?Help needed standing up from a chair using your arms (e.g., wheelchair or bedside chair)?: Total ?Help needed to walk in hospital room?: Total ?Help needed climbing 3-5 steps with a railing? : Total ?6 Click Score: 6 ? ?  ?End of Session   ?Activity Tolerance: Other (comment) (limited by cognition) ?Patient left: in bed;with  call bell/phone within reach;with bed alarm set ?Nurse Communication: Mobility status;Need for lift equipment (hoyer lift) ?PT Visit Diagnosis: Other abnormalities of gait and mobility (R26.89) ?  ? ?Time: 9390-3009 ?PT Time Calculation (min) (ACUTE ONLY): 17 min ? ? ?Charges:   PT Evaluation ?$PT Eval Moderate Complexity: 1 Mod ?  ?  ?   ? ? ?Lieutenant Diego PT, DPT ?2:36 PM,06/16/21 ? ? ?

## 2021-06-16 NOTE — Progress Notes (Signed)
? ?Date of Admission:  06/14/2021    ? ? ?ID: Robert Le is a 81 y.o. male  ?Active Problems: ?  Alzheimer's dementia without behavioral disturbance ([place]) ?  Acute cholecystitis ?  Sepsis ([place]) ?  Acute metabolic encephalopathy ?  History of DVT (deep vein thrombosis) ? ? ? ?Subjective: ?Pt says he is okay ? ?Medications:  ? apixaban  5 mg Oral BID  ? buPROPion  150 mg Oral BID  ? chlorhexidine  15 mL Mouth Rinse BID  ? donepezil  5 mg Oral Daily  ? DULoxetine  60 mg Oral Q breakfast  ? And  ? DULoxetine  30 mg Oral q1600  ? mouth rinse  15 mL Mouth Rinse q12n4p  ? memantine  10 mg Oral BID  ? sodium chloride flush  3 mL Intravenous Q12H  ? sodium chloride flush  5 mL Intracatheter Q8H  ? ? ?Objective: ?Vital signs in last 24 hours: ?Temp:  [97.3 ?F (36.3 ?C)-99.3 ?F (37.4 ?C)] 98.4 ?F (36.9 ?C) (05/17 0802) ?Pulse Rate:  [69-80] 71 (05/17 0802) ?Resp:  [16-20] 16 (05/17 0802) ?BP: (129-149)/(61-95) 149/61 (05/17 0802) ?SpO2:  [98 %] 98 % (05/17 0802) ?Weight:  [79.4 kg] 79.4 kg (05/17 0451) ? ? ? ?PHYSICAL EXAM:  ?General: pleasantly confused ?Lungs: Clear to auscultation bilaterally. No Wheezing or Rhonchi. No rales. ?Heart: Regular rate and rhythm, no murmur, rub or gallop. ?Abdomen: Soft, rt upper quadrant drain ?Extremities: atraumatic, no cyanosis. No edema. No clubbing ?Skin: No rashes or lesions. Or bruising ?Lymph: Cervical, supraclavicular normal. ?Neurologic: Grossly non-focal ? ?Lab Results ?Recent Labs  ?  06/15/21 ?1054 06/15/21 ?1210 06/16/21 ?2202  ?WBC  --  11.8* 7.0  ?HGB  --  13.0 13.6  ?HCT  --  40.2 41.7  ?NA 139  --  136  ?K 4.3  --  3.9  ?CL 104  --  101  ?CO2 23  --  26  ?BUN 14  --  13  ?CREATININE 0.99  --  0.88  ? ?Liver Panel ?Recent Labs  ?  06/15/21 ?1054 06/16/21 ?5427  ?PROT 5.4* 5.6*  ?ALBUMIN 3.0* 3.1*  ?AST 73* 45*  ?ALT 106* 76*  ?ALKPHOS 83 75  ?BILITOT 0.8 0.5  ?BILIDIR  --  0.1  ?IBILI  --  0.4  ? ? ? ?Microbiology: ?[facility] e.coli bacteremia ?Studies/Results: ?CT HEAD WO  CONTRAST (5MM) ? ?Result Date: 06/14/2021 ?CLINICAL DATA:  Mental status change, unknown cause EXAM: CT HEAD WITHOUT CONTRAST TECHNIQUE: Contiguous axial images were obtained from the base of the skull through the vertex without intravenous contrast. RADIATION DOSE REDUCTION: This exam was performed according to the departmental dose-optimization program which includes automated exposure control, adjustment of the mA and/or kV according to patient size and/or use of iterative reconstruction technique. COMPARISON:  04/17/2021 FINDINGS: Brain: There is atrophy and chronic small vessel disease changes. No acute intracranial abnormality. Specifically, no hemorrhage, hydrocephalus, mass lesion, acute infarction, or significant intracranial injury. Vascular: No hyperdense vessel or unexpected calcification. Skull: No acute calvarial abnormality. Sinuses/Orbits: No acute findings Other: None IMPRESSION: Atrophy, chronic microvascular disease. No acute intracranial abnormality. Electronically Signed   By: Rolm Baptise M.D.   On: 06/14/2021 20:24  ? ?CT ABDOMEN PELVIS W CONTRAST ? ?Result Date: 06/14/2021 ?CLINICAL DATA:  Abdominal pain, acute, nonlocalized. Recent percutaneous cholecystostomy tube for acalculous cholecystitis with drain exchange 06/10/2021. EXAM: CT ABDOMEN AND PELVIS WITH CONTRAST TECHNIQUE: Multidetector CT imaging of the abdomen and pelvis was performed using  the standard protocol following bolus administration of intravenous contrast. RADIATION DOSE REDUCTION: This exam was performed according to the departmental dose-optimization program which includes automated exposure control, adjustment of the mA and/or kV according to patient size and/or use of iterative reconstruction technique. CONTRAST:  13m OMNIPAQUE IOHEXOL 300 MG/ML  SOLN COMPARISON:  04/17/2021 CT chest, abdomen and pelvis. FINDINGS: Lower chest: Mild curvilinear bibasilar scarring versus atelectasis. Coronary atherosclerosis.  Hepatobiliary: Normal liver size. No liver mass. Percutaneous cholecystostomy tube is well positioned with distal pigtail in the gallbladder lumen. Collapsed gallbladder with suggestion of mild diffuse gallbladder wall thickening no radiopaque cholelithiasis. No significant pericholecystic fluid or fat stranding. No biliary ductal dilatation. CBD diameter 2 mm. Pancreas: Normal, with no mass or duct dilation. Spleen: Normal size. No mass. Adrenals/Urinary Tract: Normal adrenals. Surgical absence of the left kidney. Normal right kidney with no right renal masses and no right hydronephrosis. Nondistended bladder with chronic mild diffuse bladder wall thickening. Stomach/Bowel: Normal non-distended stomach. Normal caliber small bowel with no small bowel wall thickening. Appendix not discretely visualized. No pericecal inflammatory changes. Mild sigmoid diverticulosis with no large bowel wall thickening or significant pericolonic fat stranding. Moderate rectal distention by stool. Vascular/Lymphatic: Atherosclerotic nonaneurysmal abdominal aorta. Patent portal, splenic, hepatic and right renal veins. No pathologically enlarged lymph nodes in the abdomen or pelvis. Reproductive: Mildly enlarged prostate. Other: No pneumoperitoneum, ascites or focal fluid collection. Musculoskeletal: No aggressive appearing focal osseous lesions. 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 IMPRESSION: 1. Percutaneous cholecystostomy tube is well positioned. Collapsed gallbladder with suggestion of mild diffuse gallbladder wall thickening, similar to prior CT and probably chronic. No radiopaque cholelithiasis. No biliary ductal dilatation. 2. Moderate rectal distention by stool, cannot exclude rectal stool impaction. No evidence of bowel obstruction or acute bowel inflammation. Mild sigmoid diverticulosis. 3. Chronic mild diffuse bladder wall thickening, probably due to chronic bladder outlet obstruction by  the mildly enlarged prostate. 4. Aortic Atherosclerosis (ICD10-I70.0). Electronically Signed   By: JIlona SorrelM.D.   On: 06/14/2021 20:37  ? ?DG Chest Port 1 View ? ?Result Date: 06/14/2021 ?CLINICAL DATA:  Possible sepsis EXAM: PORTABLE CHEST 1 VIEW COMPARISON:  04/16/2021 FINDINGS: Cardiac shadow is stable. The overall inspiratory effort is poor. Elevation of the right hemidiaphragm is noted. Cholecystostomy tube is noted in place. Small left pleural effusion is noted stable from the prior study. IMPRESSION: Overall stable appearance of the chest when compared with the prior study. Electronically Signed   By: MInez CatalinaM.D.   On: 06/14/2021 19:03   ? ? ?Assessment/Plan: ?Pt with h/o acute cholecystitis s/p Perc cholecystotomy in March 2023 ?E.coli bacteremia- likely source gall bladder. ?Due tot eh catheter being capped on 06/10/21 ?Ascending cholangitis  ?Culture sent from bile today ? ?Abnormal LFTS improving ? ?Dementia ?DVT on eliquis ? ?Discussed the management with the care team ?  ?

## 2021-06-16 NOTE — Progress Notes (Signed)
?PROGRESS NOTE ? ? ? ?Robert Le  QJF:[id number] DOB: Sep 27, 1940 DOA: 06/14/2021 ?PCP: Venia Carbon, MD  ? ? ?Brief Narrative:  ?81 year old male with a history of Alzheimer's disease admitted for E. coli sepsis ?  ?5/16: BCID positive for E. coli, status post uncapped and flushing of catheter under IR with immediate return of bile.  ID consult ? ?5/17: Patient stable.  Remains afebrile over interval.  Biliary bag changed and will draw cultures per infectious disease recommendations.  Patient remains on IV Rocephin and Flagyl. ? ?Assessment & Plan: ?  ?Active Problems: ?  Alzheimer's dementia without behavioral disturbance ([place]) ?  Sepsis ([place]) ?  Acute cholecystitis ?  Acute metabolic encephalopathy ?  History of DVT (deep vein thrombosis) ? ?Alzheimer's dementia without behavioral disturbance ([place]) ?Continue home medications including Wellbutrin, Aricept, Cymbalta and Namenda ?Palliative care consulted. ?Patient does not have capacity.  Unable to reach family ?  ?Sepsis ([place]) ?BCID positive for E. Coli ?Remained stable on IV Rocephin and Flagyl ?Status post on catheter flushing of IR placed cholecystostomy with immediate return of bile ?Infectious disease and surgery following ?No plans for inpatient cholecystectomy ?Plan: ?Continue antibiotics ?Daily CMP ?Nursing changed biliary drain bag, will collect culture ?Monitor vitals and fever curve ?  ?Acute cholecystitis ?Abnormal lft. ?Currently percutaneous cholecystostomy tube in place. ?We will defer per general surgery recommendations for replacement versus cholecystectomy. ?  ?Acute metabolic encephalopathy ?Likely due to sepsis with underlying Alzheimer's dementia ?Baseline unclear ?  ?History of DVT (deep vein thrombosis) ?Resume Eliquis.  He was diagnosed with left lower extremity DVT in March 2023 ? ?DVT prophylaxis: Eliquis ?Code Status: Full ?Family Communication: None today ?Disposition Plan: Status is: Inpatient ?Remains inpatient appropriate  because: Sepsis secondary to E. coli bacteremia and acute cholecystitis.  Biliary drain in place ? ? ?Level of care: Med-Surg ? ?Consultants:  ?ID ?General surgery ? ?Procedures:  ?Cholecystostomy drain uncapping ? ?Antimicrobials: ?Ceftriaxone ?Metronidazole ? ? ?Subjective: ?Seen and examined.  Patient pleasantly confused.  Appears comfortable.  Offers no complaints. ? ?Objective: ?Vitals:  ? 06/15/21 2026 06/16/21 0442 06/16/21 0451 06/16/21 0802  ?BP: (!) 136/95 (!) 129/93  (!) 149/61  ?Pulse: 80 69  71  ?Resp: '16 18  16  '$ ?Temp: 99.3 ?F (37.4 ?C) (!) 97.3 ?F (36.3 ?C)  98.4 ?F (36.9 ?C)  ?TempSrc: Oral Axillary  Oral  ?SpO2: 98% 98%  98%  ?Weight:   79.4 kg   ?Height:      ? ? ?Intake/Output Summary (Last 24 hours) at 06/16/2021 1140 ?Last data filed at 06/16/2021 1022 ?Gross per 24 hour  ?Intake 3182 ml  ?Output 620 ml  ?Net 2562 ml  ? ?Filed Weights  ? 06/14/21 1827 06/16/21 0451  ?Weight: 72.6 kg 79.4 kg  ? ? ?Examination: ? ?General exam: No acute distress.  Pleasantly confused ?Respiratory system: Clear to auscultation. Respiratory effort normal. ?Cardiovascular system: S1-S2, RRR, no murmurs, no pedal edema ?Gastrointestinal system: Soft, NT/ND, PERC Coley drain in place, no surrounding erythema or drainage ?Central nervous system: Alert.  Oriented x1.  No focal deficits.  Does not follow commands ?Skin: No rashes, lesions or ulcers ?Psychiatry: Judgement and insight appear impaired. Mood & affect confused.  ? ? ? ?Data Reviewed: I have personally reviewed following labs and imaging studies ? ?CBC: ?Recent Labs  ?Lab 06/14/21 ?1828 06/15/21 ?1210 06/16/21 ?8937  ?WBC 15.9* 11.8* 7.0  ?NEUTROABS 14.6*  --   --   ?HGB 15.5 13.0 13.6  ?HCT 48.0 40.2  41.7  ?MCV 91.6 92.0 91.6  ?PLT 196 171 165  ? ?Basic Metabolic Panel: ?Recent Labs  ?Lab 06/14/21 ?1828 06/15/21 ?1054 06/16/21 ?0962  ?NA 141 139 136  ?K 3.9 4.3 3.9  ?CL 105 104 101  ?CO2 '27 23 26  '$ ?GLUCOSE 115* 90 89  ?BUN '18 14 13  '$ ?CREATININE 1.08 0.99 0.88   ?CALCIUM 9.1 8.9 8.7*  ? ?GFR: ?Estimated Creatinine Clearance: 64.2 mL/min (by C-G formula based on SCr of 0.88 mg/dL). ?Liver Function Tests: ?Recent Labs  ?Lab 06/14/21 ?1828 06/15/21 ?1054 06/16/21 ?8366  ?AST 169* 73* 45*  ?ALT 179* 106* 76*  ?ALKPHOS 117 83 75  ?BILITOT 1.0 0.8 0.5  ?PROT 6.2* 5.4* 5.6*  ?ALBUMIN 3.6 3.0* 3.1*  ? ?Recent Labs  ?Lab 06/14/21 ?1828  ?LIPASE 25  ? ?No results for input(s): AMMONIA in the last 168 hours. ?Coagulation Profile: ?Recent Labs  ?Lab 06/14/21 ?1828  ?INR 1.2  ? ?Cardiac Enzymes: ?No results for input(s): CKTOTAL, CKMB, CKMBINDEX, TROPONINI in the last 168 hours. ?BNP (last 3 results) ?No results for input(s): PROBNP in the last 8760 hours. ?HbA1C: ?No results for input(s): HGBA1C in the last 72 hours. ?CBG: ?No results for input(s): GLUCAP in the last 168 hours. ?Lipid Profile: ?No results for input(s): CHOL, HDL, LDLCALC, TRIG, CHOLHDL, LDLDIRECT in the last 72 hours. ?Thyroid Function Tests: ?No results for input(s): TSH, T4TOTAL, FREET4, T3FREE, THYROIDAB in the last 72 hours. ?Anemia Panel: ?No results for input(s): VITAMINB12, FOLATE, FERRITIN, TIBC, IRON, RETICCTPCT in the last 72 hours. ?Sepsis Labs: ?Recent Labs  ?Lab 06/14/21 ?1828 06/14/21 ?2139  ?LATICACIDVEN 1.9 1.3  ? ? ?Recent Results (from the past 240 hour(s))  ?Blood Culture (routine x 2)     Status: Abnormal (Preliminary result)  ? Collection Time: 06/14/21  6:28 PM  ? Specimen: BLOOD  ?Result Value Ref Range Status  ? Specimen Description   Final  ?  BLOOD BLOOD LEFT FOREARM ?Performed at [facility], [address] [place], [place] [address] ?  ? Special Requests   Final  ?  BOTTLES DRAWN AEROBIC AND ANAEROBIC Blood Culture results may not be optimal due to an inadequate volume of blood received in culture bottles ?Performed at [facility], [address], [place], [place] [address] ?  ? Culture  Setup Time   Final  ?  GRAM NEGATIVE RODS ?IN BOTH AEROBIC AND ANAEROBIC  BOTTLES ?CRITICAL RESULT CALLED TO, READ BACK BY AND VERIFIED WITH: ?DOVEN MITCHEAL'@0650'$  06/15/21 RH ?  ? Culture (A)  Final  ?  ESCHERICHIA COLI ?SUSCEPTIBILITIES TO FOLLOW ?Performed at [facility], [place] [address] [place], [place] [address] ?  ? Report Status PENDING  Incomplete  ?Blood Culture ID Panel (Reflexed)     Status: Abnormal  ? Collection Time: 06/14/21  6:28 PM  ?Result Value Ref Range Status  ? Enterococcus faecalis NOT DETECTED NOT DETECTED Final  ? Enterococcus Faecium NOT DETECTED NOT DETECTED Final  ? Listeria monocytogenes NOT DETECTED NOT DETECTED Final  ? Staphylococcus species NOT DETECTED NOT DETECTED Final  ? Staphylococcus aureus (BCID) NOT DETECTED NOT DETECTED Final  ? Staphylococcus epidermidis NOT DETECTED NOT DETECTED Final  ? Staphylococcus lugdunensis NOT DETECTED NOT DETECTED Final  ? Streptococcus species NOT DETECTED NOT DETECTED Final  ? Streptococcus agalactiae NOT DETECTED NOT DETECTED Final  ? Streptococcus pneumoniae NOT DETECTED NOT DETECTED Final  ? Streptococcus pyogenes NOT DETECTED NOT DETECTED Final  ? A.calcoaceticus-baumannii NOT DETECTED NOT DETECTED Final  ?  Bacteroides fragilis NOT DETECTED NOT DETECTED Final  ? Enterobacterales DETECTED (A) NOT DETECTED Final  ?  Comment: Enterobacterales represent a large order of gram negative bacteria, not a single organism. ?CRITICAL RESULT CALLED TO, READ BACK BY AND VERIFIED WITH: ?DOVEN MITCHEAL'@0650'$  06/15/21 RH ?  ? Enterobacter cloacae complex NOT DETECTED NOT DETECTED Final  ? Escherichia coli DETECTED (A) NOT DETECTED Final  ?  Comment: CRITICAL RESULT CALLED TO, READ BACK BY AND VERIFIED WITH: ?DOVEN MITCHEAL'@0650'$  06/15/21 RH ?  ? Klebsiella aerogenes NOT DETECTED NOT DETECTED Final  ? Klebsiella oxytoca NOT DETECTED NOT DETECTED Final  ? Klebsiella pneumoniae NOT DETECTED NOT DETECTED Final  ? Proteus species NOT DETECTED NOT DETECTED Final  ? Salmonella species NOT DETECTED NOT DETECTED Final  ? Serratia  marcescens NOT DETECTED NOT DETECTED Final  ? Haemophilus influenzae NOT DETECTED NOT DETECTED Final  ? Neisseria meningitidis NOT DETECTED NOT DETECTED Final  ? Pseudomonas aeruginosa NOT DETECTED NOT DETECTED Final  ?

## 2021-06-17 DIAGNOSIS — K810 Acute cholecystitis: Secondary | ICD-10-CM | POA: Diagnosis not present

## 2021-06-17 DIAGNOSIS — B9620 Unspecified Escherichia coli [E. coli] as the cause of diseases classified elsewhere: Secondary | ICD-10-CM | POA: Diagnosis not present

## 2021-06-17 DIAGNOSIS — A4151 Sepsis due to Escherichia coli [E. coli]: Secondary | ICD-10-CM | POA: Diagnosis not present

## 2021-06-17 DIAGNOSIS — R7881 Bacteremia: Secondary | ICD-10-CM | POA: Diagnosis not present

## 2021-06-17 LAB — COMPREHENSIVE METABOLIC PANEL
ALT: 53 U/L — ABNORMAL HIGH (ref 0–44)
AST: 31 U/L (ref 15–41)
Albumin: 3.3 g/dL — ABNORMAL LOW (ref 3.5–5.0)
Alkaline Phosphatase: 72 U/L (ref 38–126)
Anion gap: 9 (ref 5–15)
BUN: 9 mg/dL (ref 8–23)
CO2: 27 mmol/L (ref 22–32)
Calcium: 8.8 mg/dL — ABNORMAL LOW (ref 8.9–10.3)
Chloride: 100 mmol/L (ref 98–111)
Creatinine, Ser: 0.96 mg/dL (ref 0.61–1.24)
GFR, Estimated: >60 mL/min (ref >60)
Glucose, Bld: 97 mg/dL (ref 70–99)
Potassium: 3.6 mmol/L (ref 3.5–5.1)
Sodium: 136 mmol/L (ref 135–145)
Total Bilirubin: 0.6 mg/dL (ref 0.3–1.2)
Total Protein: 6 g/dL — ABNORMAL LOW (ref 6.5–8.1)

## 2021-06-17 LAB — CULTURE, BLOOD (ROUTINE X 2)

## 2021-06-17 LAB — URINE CULTURE: Culture: NO GROWTH

## 2021-06-17 NOTE — Progress Notes (Signed)
PROGRESS NOTE    Robert Le  UEA:[id number] DOB: November 25, 1940 DOA: 06/14/2021 PCP: Venia Carbon, MD    Brief Narrative:  81 year old male with a history of Alzheimer's disease admitted for E. coli sepsis   5/16: BCID positive for E. coli, status post uncapped and flushing of catheter under IR with immediate return of bile.  ID consult  5/17: Patient stable.  Remains afebrile over interval.  Biliary bag changed and will draw cultures per infectious disease recommendations.  Patient remains on IV Rocephin and Flagyl.  Assessment & Plan:   Active Problems:   Alzheimer's dementia without behavioral disturbance ([facility])   Sepsis ([place])   Acute cholecystitis   Acute metabolic encephalopathy   History of DVT (deep vein thrombosis)  Alzheimer's dementia without behavioral disturbance ([facility]) Continue home medications including Wellbutrin, Aricept, Cymbalta and Namenda Palliative care consulted. Patient does not have capacity.  Unable to reach family Confirmed with [facility] memory care, patient okay to return once medically ready  Sepsis ([place]) BCID positive for E. Coli Remained stable on IV Rocephin and Flagyl Status post on catheter flushing of IR placed cholecystostomy with immediate return of bile Infectious disease and surgery following No plans for inpatient cholecystectomy Plan: Continue IV Rocephin and Flagyl Follow culture from biliary drain drawn 5/17 Infectious disease follow-up Monitor vitals and fever curve   Acute cholecystitis Abnormal lft. Currently percutaneous cholecystostomy tube in place. We will defer per general surgery recommendations for replacement versus cholecystectomy. No plans for inpatient cholecystectomy   Acute metabolic encephalopathy Likely due to sepsis with underlying Alzheimer's dementia Baseline unclear   History of DVT (deep vein thrombosis) Continue Eliquis.  He was diagnosed with left lower extremity DVT in March 2023  DVT  prophylaxis: Eliquis Code Status: Full Family Communication: None today Disposition Plan: Status is: Inpatient Remains inpatient appropriate because: Sepsis secondary to E. coli bacteremia and acute cholecystitis.  Biliary drain in place.  Pending ID follow-up and further recommendations regarding antibiotics prior to discharge planning.  Anticipate medical readiness in 24 hours   Level of care: Med-Surg  Consultants:  ID General surgery  Procedures:  Cholecystostomy drain uncapping  Antimicrobials: Ceftriaxone Metronidazole   Subjective: Seen and examined.  Patient pleasantly confused.  Appears comfortable.  Offers no complaints.  Objective: Vitals:   06/16/21 1615 06/16/21 1935 06/17/21 0310 06/17/21 0744  BP: (!) 153/94 139/87 (!) 163/98 (!) 152/92  Pulse: 77 74 79 71  Resp: '17 16 17 18  '$ Temp: 98.1 F (36.7 C) 98 F (36.7 C) 97.9 F (36.6 C) 97.9 F (36.6 C)  TempSrc: Oral Oral Oral Oral  SpO2: 99% 98% 97% 96%  Weight:      Height:        Intake/Output Summary (Last 24 hours) at 06/17/2021 1225 Last data filed at 06/17/2021 1047 Gross per 24 hour  Intake 630 ml  Output 3000 ml  Net -2370 ml   Filed Weights   06/14/21 1827 06/16/21 0451  Weight: 72.6 kg 79.4 kg    Examination:  General exam:  Pleasantly confused.  In no distress Respiratory system: Lungs clear.  Normal work of breathing.  Room air Cardiovascular system: S1-S2, RRR, no murmurs, no pedal edema Gastrointestinal system: Soft, NT/ND, PERC Coley drain in place, no surrounding erythema or drainage Central nervous system: Alert.  Oriented x1.  No focal deficits.  Does not follow commands Skin: No rashes, lesions or ulcers Psychiatry: Judgement and insight appear impaired. Mood & affect confused.  Data Reviewed: I have personally reviewed following labs and imaging studies  CBC: Recent Labs  Lab 06/14/21 1828 06/15/21 1210 06/16/21 0557  WBC 15.9* 11.8* 7.0  NEUTROABS 14.6*  --   --    HGB 15.5 13.0 13.6  HCT 48.0 40.2 41.7  MCV 91.6 92.0 91.6  PLT 196 171 938   Basic Metabolic Panel: Recent Labs  Lab 06/14/21 1828 06/15/21 1054 06/16/21 0557 06/17/21 0738  NA 141 139 136 136  K 3.9 4.3 3.9 3.6  CL 105 104 101 100  CO2 '27 23 26 27  '$ GLUCOSE 115* 90 89 97  BUN '18 14 13 9  '$ CREATININE 1.08 0.99 0.88 0.96  CALCIUM 9.1 8.9 8.7* 8.8*   GFR: Estimated Creatinine Clearance: 58.9 mL/min (by C-G formula based on SCr of 0.96 mg/dL). Liver Function Tests: Recent Labs  Lab 06/14/21 1828 06/15/21 1054 06/16/21 0557 06/17/21 0738  AST 169* 73* 45* 31  ALT 179* 106* 76* 53*  ALKPHOS 117 83 75 72  BILITOT 1.0 0.8 0.5 0.6  PROT 6.2* 5.4* 5.6* 6.0*  ALBUMIN 3.6 3.0* 3.1* 3.3*   Recent Labs  Lab 06/14/21 1828  LIPASE 25   No results for input(s): AMMONIA in the last 168 hours. Coagulation Profile: Recent Labs  Lab 06/14/21 1828  INR 1.2   Cardiac Enzymes: No results for input(s): CKTOTAL, CKMB, CKMBINDEX, TROPONINI in the last 168 hours. BNP (last 3 results) No results for input(s): PROBNP in the last 8760 hours. HbA1C: No results for input(s): HGBA1C in the last 72 hours. CBG: No results for input(s): GLUCAP in the last 168 hours. Lipid Profile: No results for input(s): CHOL, HDL, LDLCALC, TRIG, CHOLHDL, LDLDIRECT in the last 72 hours. Thyroid Function Tests: No results for input(s): TSH, T4TOTAL, FREET4, T3FREE, THYROIDAB in the last 72 hours. Anemia Panel: No results for input(s): VITAMINB12, FOLATE, FERRITIN, TIBC, IRON, RETICCTPCT in the last 72 hours. Sepsis Labs: Recent Labs  Lab 06/14/21 1828 06/14/21 2139  LATICACIDVEN 1.9 1.3    Recent Results (from the past 240 hour(s))  Blood Culture (routine x 2)     Status: Abnormal   Collection Time: 06/14/21  6:28 PM   Specimen: BLOOD  Result Value Ref Range Status   Specimen Description   Final    BLOOD BLOOD LEFT FOREARM Performed at [facility], [address]  [place], [place] [address]    Special Requests   Final    BOTTLES DRAWN AEROBIC AND ANAEROBIC Blood Culture results may not be optimal due to an inadequate volume of blood received in culture bottles Performed at [facility], [address] [place], [place] [address]    Culture  Setup Time   Final    GRAM NEGATIVE RODS IN BOTH AEROBIC AND ANAEROBIC BOTTLES CRITICAL RESULT CALLED TO, READ BACK BY AND VERIFIED WITH: DOVEN MITCHEAL'@0650'$  06/15/21 RH Performed at [facility], [place] [address], [place], [place] [address]    Culture ESCHERICHIA COLI (A)  Final   Report Status 06/17/2021 FINAL  Final   Organism ID, Bacteria ESCHERICHIA COLI  Final      Susceptibility   Escherichia coli - MIC*    AMPICILLIN >=32 RESISTANT Resistant     CEFAZOLIN <=4 SENSITIVE Sensitive     CEFEPIME <=0.12 SENSITIVE Sensitive     CEFTAZIDIME <=1 SENSITIVE Sensitive     CEFTRIAXONE <=0.25 SENSITIVE Sensitive     CIPROFLOXACIN <=0.25 SENSITIVE Sensitive     GENTAMICIN <=1 SENSITIVE Sensitive  IMIPENEM <=0.25 SENSITIVE Sensitive     TRIMETH/SULFA <=20 SENSITIVE Sensitive     AMPICILLIN/SULBACTAM 16 INTERMEDIATE Intermediate     PIP/TAZO <=4 SENSITIVE Sensitive     * ESCHERICHIA COLI  Urine Culture     Status: None   Collection Time: 06/14/21  6:28 PM   Specimen: In/Out Cath Urine  Result Value Ref Range Status   Specimen Description   Final    IN/OUT CATH URINE Performed at [facility], [address], [place], [place] [address]    Special Requests   Final    NONE Performed at [facility], [address] [place], [place] [address]    Culture   Final    NO GROWTH Performed at [facility], [place] [address] [place], [place] [address]    Report Status 06/17/2021 FINAL  Final  Blood Culture ID Panel (Reflexed)     Status: Abnormal   Collection Time: 06/14/21  6:28 PM  Result Value Ref Range Status   Enterococcus faecalis NOT DETECTED NOT DETECTED Final    Enterococcus Faecium NOT DETECTED NOT DETECTED Final   Listeria monocytogenes NOT DETECTED NOT DETECTED Final   Staphylococcus species NOT DETECTED NOT DETECTED Final   Staphylococcus aureus (BCID) NOT DETECTED NOT DETECTED Final   Staphylococcus epidermidis NOT DETECTED NOT DETECTED Final   Staphylococcus lugdunensis NOT DETECTED NOT DETECTED Final   Streptococcus species NOT DETECTED NOT DETECTED Final   Streptococcus agalactiae NOT DETECTED NOT DETECTED Final   Streptococcus pneumoniae NOT DETECTED NOT DETECTED Final   Streptococcus pyogenes NOT DETECTED NOT DETECTED Final   A.calcoaceticus-baumannii NOT DETECTED NOT DETECTED Final   Bacteroides fragilis NOT DETECTED NOT DETECTED Final   Enterobacterales DETECTED (A) NOT DETECTED Final    Comment: Enterobacterales represent a large order of gram negative bacteria, not a single organism. CRITICAL RESULT CALLED TO, READ BACK BY AND VERIFIED WITH: DOVEN MITCHEAL'@0650'$  06/15/21 RH    Enterobacter cloacae complex NOT DETECTED NOT DETECTED Final   Escherichia coli DETECTED (A) NOT DETECTED Final    Comment: CRITICAL RESULT CALLED TO, READ BACK BY AND VERIFIED WITH: DOVEN MITCHEAL'@0650'$  06/15/21 RH    Klebsiella aerogenes NOT DETECTED NOT DETECTED Final   Klebsiella oxytoca NOT DETECTED NOT DETECTED Final   Klebsiella pneumoniae NOT DETECTED NOT DETECTED Final   Proteus species NOT DETECTED NOT DETECTED Final   Salmonella species NOT DETECTED NOT DETECTED Final   Serratia marcescens NOT DETECTED NOT DETECTED Final   Haemophilus influenzae NOT DETECTED NOT DETECTED Final   Neisseria meningitidis NOT DETECTED NOT DETECTED Final   Pseudomonas aeruginosa NOT DETECTED NOT DETECTED Final   Stenotrophomonas maltophilia NOT DETECTED NOT DETECTED Final   Candida albicans NOT DETECTED NOT DETECTED Final   Candida auris NOT DETECTED NOT DETECTED Final   Candida glabrata NOT DETECTED NOT DETECTED Final   Candida krusei NOT DETECTED NOT DETECTED Final    Candida parapsilosis NOT DETECTED NOT DETECTED Final   Candida tropicalis NOT DETECTED NOT DETECTED Final   Cryptococcus neoformans/gattii NOT DETECTED NOT DETECTED Final   CTX-M ESBL NOT DETECTED NOT DETECTED Final   Carbapenem resistance IMP NOT DETECTED NOT DETECTED Final   Carbapenem resistance KPC NOT DETECTED NOT DETECTED Final   Carbapenem resistance NDM NOT DETECTED NOT DETECTED Final   Carbapenem resist OXA 48 LIKE NOT DETECTED NOT DETECTED Final   Carbapenem resistance VIM NOT DETECTED NOT DETECTED Final    Comment: Performed at [facility], [address] [place], [place] [address]  Blood Culture (routine x 2)     Status: None (Preliminary result)   Collection Time: 06/14/21  9:39 PM   Specimen: BLOOD  Result Value Ref Range Status   Specimen Description BLOOD BLOOD RIGHT HAND  Final   Special Requests   Final    BOTTLES DRAWN AEROBIC AND ANAEROBIC Blood Culture adequate volume   Culture   Final    NO GROWTH 3 DAYS Performed at [facility], [address], [place], [place] [address]    Report Status PENDING  Incomplete  MRSA Next Gen by PCR, Nasal     Status: None   Collection Time: 06/16/21 12:44 AM   Specimen: Nasal Mucosa; Nasal Swab  Result Value Ref Range Status   MRSA by PCR Next Gen NOT DETECTED NOT DETECTED Final    Comment: (NOTE) The GeneXpert MRSA Assay (FDA approved for NASAL specimens only), is one component of a comprehensive MRSA colonization surveillance program. It is not intended to diagnose MRSA infection nor to guide or monitor treatment for MRSA infections. Test performance is not FDA approved in patients less than 25 years old. Performed at [facility], [address] [place], [place] [address]   Body fluid culture w Gram Stain     Status: None (Preliminary result)   Collection Time: 06/16/21  1:30 PM   Specimen: BILE; Body Fluid  Result Value Ref Range Status   Specimen Description   Final     BILE Performed at [facility], [address] [place], [place] [address]    Special Requests   Final    NONE Performed at [facility], [place], [place] [address]    Gram Stain NO WBC SEEN NO ORGANISMS SEEN   Final   Culture   Final    NO GROWTH < 24 HOURS Performed at [facility], [place] [address], [place], [place] [address]    Report Status PENDING  Incomplete         Radiology Studies: No results found.      Scheduled Meds:  apixaban  5 mg Oral BID   buPROPion  150 mg Oral BID   chlorhexidine  15 mL Mouth Rinse BID   donepezil  5 mg Oral Daily   DULoxetine  60 mg Oral Q breakfast   And   DULoxetine  30 mg Oral q1600   mouth rinse  15 mL Mouth Rinse q12n4p   memantine  10 mg Oral BID   sodium chloride flush  3 mL Intravenous Q12H   sodium chloride flush  5 mL Intracatheter Q8H   Continuous Infusions:  cefTRIAXone (ROCEPHIN)  IV 2 g (06/17/21 0839)   metronidazole 500 mg (06/17/21 0840)     LOS: 3 days     Sidney Ace, MD [facility]   If 7PM-7AM, please contact night-coverage  06/17/2021, 12:25 PM

## 2021-06-17 NOTE — Progress Notes (Signed)
   Date of Admission:  06/14/2021      ID: Robert Le is a 81 y.o. male  Active Problems:   Alzheimer's dementia without behavioral disturbance ([place])   Acute cholecystitis   Sepsis ([place])   Acute metabolic encephalopathy   History of DVT (deep vein thrombosis)    Subjective: Pt says he is okay Other wise unable to have a conversation as he is pleasantly confused  Medications:   apixaban  5 mg Oral BID   buPROPion  150 mg Oral BID   chlorhexidine  15 mL Mouth Rinse BID   donepezil  5 mg Oral Daily   DULoxetine  60 mg Oral Q breakfast   And   DULoxetine  30 mg Oral q1600   mouth rinse  15 mL Mouth Rinse q12n4p   memantine  10 mg Oral BID   sodium chloride flush  3 mL Intravenous Q12H   sodium chloride flush  5 mL Intracatheter Q8H    Objective: Vital signs in last 24 hours: Temp:  [97.9 F (36.6 C)-98.1 F (36.7 C)] 97.9 F (36.6 C) (05/18 0744) Pulse Rate:  [71-79] 71 (05/18 0744) Resp:  [16-18] 18 (05/18 0744) BP: (139-163)/(87-98) 152/92 (05/18 0744) SpO2:  [96 %-99 %] 96 % (05/18 0744)    PHYSICAL EXAM:  General: pleasantly confused Lungs: Clear to auscultation bilaterally. No Wheezing or Rhonchi. No rales. Heart: Regular rate and rhythm, no murmur, rub or gallop. Abdomen: Soft, rt upper quadrant drain Extremities: atraumatic, no cyanosis. No edema. No clubbing Skin: No rashes or lesions. Or bruising Lymph: Cervical, supraclavicular normal. Neurologic: Grossly non-focal  Lab Results Recent Labs    06/15/21 1210 06/16/21 0557 06/17/21 0738  WBC 11.8* 7.0  --   HGB 13.0 13.6  --   HCT 40.2 41.7  --   NA  --  136 136  K  --  3.9 3.6  CL  --  101 100  CO2  --  26 27  BUN  --  13 9  CREATININE  --  0.88 0.96   Liver Panel Recent Labs    06/16/21 0557 06/17/21 0738  PROT 5.6* 6.0*  ALBUMIN 3.1* 3.3*  AST 45* 31  ALT 76* 53*  ALKPHOS 75 72  BILITOT 0.5 0.6  BILIDIR 0.1  --   IBILI 0.4  --      Microbiology: BC e.coli  bacteremia Studies/Results: No results found.   Assessment/Plan: Pt with h/o acute cholecystitis s/p Perc cholecystotomy in March 2023 E.coli bacteremia- likely source gall bladder. Due to the  catheter being capped on 06/10/21 Ascending cholangitis  Culture sent from bile t so far no growth UC NG Need a total of 2 weeks of antibiotic- can be PO meds like quinolone ( if no DDI, contraindication, or bactrim)  Abnormal LFTS improving  Dementia DVT on eliquis  Discussed the management with the care team

## 2021-06-17 NOTE — Progress Notes (Signed)
Palliative-   Chart reviewed- attempted again to call son.  Message left requesting return call for Palliative consult.   Mariana Kaufman, AGNP-C Palliative Medicine  No charge

## 2021-06-17 NOTE — TOC Progression Note (Signed)
Transition of Care [facility]) - Progression Note    Patient Details  Name: Robert Le MRN: [id number] Date of Birth: [birth year]  Transition of Care [facility]) CM/SW [place], LCSW Phone Number: 06/17/2021, 9:03 AM  Clinical Narrative:   Robert Le admissions coordinator confirmed patient can return to the [facility]F even though he is requiring total assist.  Expected Discharge Plan: Memory Care Barriers to Discharge: Continued Medical Work up  Expected Discharge Plan and Services Expected Discharge Plan: [place] arrangements for the past 2 months: [place] (Memory care)                                       Social Determinants of Health (SDOH) Interventions    Readmission Risk Interventions     View : No data to display.

## 2021-06-17 NOTE — Progress Notes (Signed)
Mobility Specialist - Progress Note   06/17/21 1200  Mobility  Activity Refused mobility     Pt sleeping in bed upon arrival. Does awaken to voice then closes eyes when notifying pt of mobility goal today. Attempted to pull back blanket x2 but pt pulls covers back up on chest. Attempted to get pt to engage in ADLs while semi-supine, but refuses. Pt left in bed with alarm set, needs in reach.    Kathee Delton Mobility Specialist 06/17/21, 1:02 PM

## 2021-06-17 NOTE — Care Management Important Message (Signed)
Important Message  Patient Details  Name: Robert Le MRN: [id number] Date of Birth: 1940/02/26   Medicare Important Message Given:  N/A - LOS <3 / Initial given by admissions     Dannette Barbara 06/17/2021, 12:29 PM

## 2021-06-18 DIAGNOSIS — K810 Acute cholecystitis: Secondary | ICD-10-CM | POA: Diagnosis not present

## 2021-06-18 MED ORDER — CIPROFLOXACIN HCL 500 MG PO TABS: 500.0000 mg | ORAL_TABLET | Freq: Two times a day (BID) | ORAL | 0 refills | Status: AC

## 2021-06-18 MED ORDER — CIPROFLOXACIN HCL 500 MG PO TABS
500.0000 mg | ORAL_TABLET | Freq: Two times a day (BID) | ORAL | Status: DC
Start: 2021-06-18 — End: 2021-06-18
  Administered 2021-06-18: 500 mg via ORAL
  Filled 2021-06-18: qty 1

## 2021-06-18 NOTE — TOC Transition Note (Signed)
Transition of Care [facility]) - CM/SW Discharge Note   Patient Details  Name: Robert Le MRN: [id number] Date of Birth: 1941-01-21  Transition of Care [facility]) CM/SW Contact:  Candie Chroman, LCSW Phone Number: 06/18/2021, 9:51 AM   Clinical Narrative:   Patient has orders to discharge back to [facility] today. RN has already called report. EMS transport has been arranged. No further concerns. CSW signing off.  Final next level of care: Assisted Living Barriers to Discharge: Barriers Resolved   Patient Goals and CMS Choice     Choice offered to / list presented to : NA  Discharge Placement                Patient to be transferred to facility by: EMS Name of family member notified: Briston Lax Patient and family notified of of transfer: 06/18/21  Discharge Plan and Services                                     Social Determinants of Health (SDOH) Interventions     Readmission Risk Interventions     View : No data to display.

## 2021-06-18 NOTE — Discharge Summary (Signed)
Physician Discharge Summary  Robert Le HYW:[id number] DOB: Apr 23, 1940 DOA: 06/14/2021  PCP: Venia Carbon, MD  Admit date: 06/14/2021 Discharge date: 06/18/2021  Admitted From: Home/ALF Disposition:  ALF  Recommendations for Outpatient Follow-up:  Follow up with PCP in 1-2 weeks Referral for outpatient palliative care  Home Health: No Equipment/Devices:Biliary drain   Discharge Condition:Stable  CODE STATUS:FULL  Diet recommendation: Reg  Brief/Interim Summary: 81 year old male with a history of Alzheimer's disease admitted for E. coli sepsis   5/16: BCID positive for E. coli, status post uncapped and flushing of catheter under IR with immediate return of bile.  ID consult   5/17: Patient stable.  Remains afebrile over interval.  Biliary bag changed and will draw cultures per infectious disease recommendations.  Patient remains on IV Rocephin and Flagyl.   5/19: New cultures from biluary drain no growth.  Patient stable for dc.  Will prescribe 10 additional days of PO cipro for total 10 day antibiotic course.    Discharge Diagnoses:  Active Problems:   Alzheimer's dementia without behavioral disturbance (HCC)   Sepsis ([place])   Acute cholecystitis   Acute metabolic encephalopathy   History of DVT (deep vein thrombosis)   Alzheimer's dementia without behavioral disturbance (HCC) Continue home medications including Wellbutrin, Aricept, Cymbalta and Namenda Palliative care consulted. Patient does not have capacity.  Unable to reach family Confirmed with [facility] memory care, patient okay to return once medically ready   Sepsis ([place]) BCID positive for E. Coli Remained stable on IV Rocephin and Flagyl Status post on catheter flushing of IR placed cholecystostomy with immediate return of bile Infectious disease and surgery following No plans for inpatient cholecystectomy Plan: DC iv abx Start PO cipro 14 total day course   Acute cholecystitis Abnormal  lft. Currently percutaneous cholecystostomy tube in place. We will defer per general surgery recommendations for replacement versus cholecystectomy. No plans for inpatient cholecystectomy FU OP gen surg   Acute metabolic encephalopathy Likely due to sepsis with underlying Alzheimer's dementia Baseline unclear   History of DVT (deep vein thrombosis) Continue Eliquis.  He was diagnosed with left lower extremity DVT in March 2023  Discharge Instructions  Discharge Instructions     Diet - low sodium heart healthy   Complete by: As directed    Increase activity slowly   Complete by: As directed    No wound care   Complete by: As directed       Allergies as of 06/18/2021       Reactions   Donepezil Other (See Comments)   Increased confusion Still listed on pt's current med list as of 06/14/2021   Sulfites    Unable to recall reaction or specific sulfa drug        Medication List     STOP taking these medications    HYDROcodone-acetaminophen 5-325 MG tablet Commonly known as: NORCO/VICODIN       TAKE these medications    acetaminophen 500 MG tablet Commonly known as: TYLENOL Take 500 mg by mouth 3 (three) times daily as needed for mild pain.   apixaban 5 MG Tabs tablet Commonly known as: ELIQUIS Take 1 tablet (5 mg total) by mouth 2 (two) times daily. What changed: Another medication with the same name was removed. Continue taking this medication, and follow the directions you see here.   buPROPion 75 MG tablet Commonly known as: WELLBUTRIN Take 150 mg by mouth 2 (two) times daily. What changed: Another medication with the same name was  removed. Continue taking this medication, and follow the directions you see here.   ciprofloxacin 500 MG tablet Commonly known as: CIPRO Take 1 tablet (500 mg total) by mouth 2 (two) times daily for 10 days.   donepezil 5 MG tablet Commonly known as: ARICEPT Take 5 mg by mouth daily.   DULoxetine 30 MG capsule Commonly  known as: CYMBALTA Take 30-60 mg by mouth 2 (two) times daily. 60 mg every morning and 30 mg daily at 4 pm   gabapentin 100 MG capsule Commonly known as: NEURONTIN Take 200 mg by mouth 2 (two) times daily.   gabapentin 600 MG tablet Commonly known as: NEURONTIN Take 600 mg by mouth daily.   memantine 10 MG tablet Commonly known as: NAMENDA Take 10 mg by mouth 2 (two) times daily.   multivitamin with minerals Tabs tablet Take 1 tablet by mouth daily.   polyethylene glycol 17 g packet Commonly known as: MIRALAX / GLYCOLAX Take 17 g by mouth every 3 (three) days.        Contact information for follow-up providers     Piscoya, Jacqulyn Bath, MD. Schedule an appointment as soon as possible for a visit in 1 month(s).   Specialty: General Surgery Why: Follow up in 3-4 weeks; hospital follow up; cholecystitis with cholecystostomy tube Contact information: [address] [place] [address] [phone]              Contact information for after-discharge care     Destination     [facility] .   Service: Skilled Nursing Contact information: [place] [address] [phone]                    Allergies  Allergen Reactions   Donepezil Other (See Comments)    Increased confusion Still listed on pt's current med list as of 06/14/2021   Sulfites     Unable to recall reaction or specific sulfa drug    Consultations: Surgery IR ID   Procedures/Studies: CT HEAD WO CONTRAST (5MM)  Result Date: 06/14/2021 CLINICAL DATA:  Mental status change, unknown cause EXAM: CT HEAD WITHOUT CONTRAST TECHNIQUE: Contiguous axial images were obtained from the base of the skull through the vertex without intravenous contrast. RADIATION DOSE REDUCTION: This exam was performed according to the departmental dose-optimization program which includes automated exposure control, adjustment of the mA and/or kV according  to patient size and/or use of iterative reconstruction technique. COMPARISON:  04/17/2021 FINDINGS: Brain: There is atrophy and chronic small vessel disease changes. No acute intracranial abnormality. Specifically, no hemorrhage, hydrocephalus, mass lesion, acute infarction, or significant intracranial injury. Vascular: No hyperdense vessel or unexpected calcification. Skull: No acute calvarial abnormality. Sinuses/Orbits: No acute findings Other: None IMPRESSION: Atrophy, chronic microvascular disease. No acute intracranial abnormality. Electronically Signed   By: Rolm Baptise M.D.   On: 06/14/2021 20:24   CT ABDOMEN PELVIS W CONTRAST  Result Date: 06/14/2021 CLINICAL DATA:  Abdominal pain, acute, nonlocalized. Recent percutaneous cholecystostomy tube for acalculous cholecystitis with drain exchange 06/10/2021. EXAM: CT ABDOMEN AND PELVIS WITH CONTRAST TECHNIQUE: Multidetector CT imaging of the abdomen and pelvis was performed using the standard protocol following bolus administration of intravenous contrast. RADIATION DOSE REDUCTION: This exam was performed according to the departmental dose-optimization program which includes automated exposure control, adjustment of the mA and/or kV according to patient size and/or use of iterative reconstruction technique. CONTRAST:  165m OMNIPAQUE IOHEXOL 300 MG/ML  SOLN COMPARISON:  04/17/2021  CT chest, abdomen and pelvis. FINDINGS: Lower chest: Mild curvilinear bibasilar scarring versus atelectasis. Coronary atherosclerosis. Hepatobiliary: Normal liver size. No liver mass. Percutaneous cholecystostomy tube is well positioned with distal pigtail in the gallbladder lumen. Collapsed gallbladder with suggestion of mild diffuse gallbladder wall thickening no radiopaque cholelithiasis. No significant pericholecystic fluid or fat stranding. No biliary ductal dilatation. CBD diameter 2 mm. Pancreas: Normal, with no mass or duct dilation. Spleen: Normal size. No mass.  Adrenals/Urinary Tract: Normal adrenals. Surgical absence of the left kidney. Normal right kidney with no right renal masses and no right hydronephrosis. Nondistended bladder with chronic mild diffuse bladder wall thickening. Stomach/Bowel: Normal non-distended stomach. Normal caliber small bowel with no small bowel wall thickening. Appendix not discretely visualized. No pericecal inflammatory changes. Mild sigmoid diverticulosis with no large bowel wall thickening or significant pericolonic fat stranding. Moderate rectal distention by stool. Vascular/Lymphatic: Atherosclerotic nonaneurysmal abdominal aorta. Patent portal, splenic, hepatic and right renal veins. No pathologically enlarged lymph nodes in the abdomen or pelvis. Reproductive: Mildly enlarged prostate. Other: No pneumoperitoneum, ascites or focal fluid collection. Musculoskeletal: No aggressive appearing focal osseous lesions. 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 IMPRESSION: 1. Percutaneous cholecystostomy tube is well positioned. Collapsed gallbladder with suggestion of mild diffuse gallbladder wall thickening, similar to prior CT and probably chronic. No radiopaque cholelithiasis. No biliary ductal dilatation. 2. Moderate rectal distention by stool, cannot exclude rectal stool impaction. No evidence of bowel obstruction or acute bowel inflammation. Mild sigmoid diverticulosis. 3. 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 4. Aortic Atherosclerosis (ICD10-I70.0). Electronically Signed   By: Ilona Sorrel M.D.   On: 06/14/2021 20:37   DG Chest Port 1 View  Result Date: 06/14/2021 CLINICAL DATA:  Possible sepsis EXAM: PORTABLE CHEST 1 VIEW COMPARISON:  04/16/2021 FINDINGS: Cardiac shadow is stable. The overall inspiratory effort is poor. Elevation of the right hemidiaphragm is noted. Cholecystostomy tube is noted in place.  Small left pleural effusion is noted stable from the prior study. IMPRESSION: Overall stable appearance of the chest when compared with the prior study. Electronically Signed   By: Inez Catalina M.D.   On: 06/14/2021 19:03   IR EXCHANGE BILIARY DRAIN  Result Date: 06/10/2021 INDICATION: 81 year old gentleman with history of cholecystitis status post cholecystostomy drain placement on 04/17/2021 returns to IR for cholangiogram and drain exchange. Patient developed lower extremity DVT on 04/20/2021, which further delayed possible cholecystectomy. Patient is due to see surgical team in early June. EXAM: Cholecystostomy drain injection and exchange MEDICATIONS: None ANESTHESIA/SEDATION: None FLUOROSCOPY: Radiation Exposure Index (as provided by the fluoroscopic device): 4.5 mGy Kerma COMPLICATIONS: None immediate. PROCEDURE: Informed written consent was obtained from the patient and his son after a thorough discussion of the procedural risks, benefits and alternatives. All questions were addressed. Maximal Sterile Barrier Technique was utilized including caps, mask, sterile gowns, sterile gloves, sterile drape, hand hygiene and skin antiseptic. A timeout was performed prior to the initiation of the procedure. Patient positioned supine on the procedure table. The external segment of the right upper quadrant cholecystostomy drain and surrounding skin prepped and draped in usual fashion. Contrast administered through the drain under fluoroscopy showed patent cystic and common bile ducts. Following local administration, the existing drain was cut and removed over 0.035 inch Amplatz guidewire and replaced with new 10.2 [place] multipurpose pigtail drain. Contrast administered through the new drain under fluoroscopy confirmed appropriate positioning of the pigtail within the gallbladder lumen.  Drain was secured to skin with suture and capped. IMPRESSION: 1. Cholecystostomy drain injection demonstrates patent cystic and  common bile ducts. 2. Cholecystostomy drain exchanged for new 10.2 [place] multipurpose pigtail catheter. The drain was capped given patency of the cystic and common bile ducts. The patient was provided with bag and instructions for reattaching in case he develops pain, fever, chills, leakage around catheter. PLAN: Patient is supposed to follow up with surgical team in early June. We will schedule him for a follow-up appointment in mid June for drain check and possible removal. Electronically Signed   By: Miachel Roux M.D.   On: 06/10/2021 14:58      Subjective: Seen and examined.  Stable, at baseline.  Discharge Exam: Vitals:   06/17/21 1919 06/18/21 0329  BP: (!) 164/97 (!) 158/94  Pulse: 79 69  Resp: 16 20  Temp: (!) 97.2 F (36.2 C) (!) 97.4 F (36.3 C)  SpO2: 96% 98%   Vitals:   06/17/21 0744 06/17/21 1542 06/17/21 1919 06/18/21 0329  BP: (!) 152/92 (!) 126/107 (!) 164/97 (!) 158/94  Pulse: 71 81 79 69  Resp: '18 18 16 20  '$ Temp: 97.9 F (36.6 C) 99.3 F (37.4 C) (!) 97.2 F (36.2 C) (!) 97.4 F (36.3 C)  TempSrc: Oral  Axillary Axillary  SpO2: 96% 96% 96% 98%  Weight:      Height:        General: Pt is alert, awake, not in acute distress Cardiovascular: RRR, S1/S2 +, no rubs, no gallops Respiratory: CTA bilaterally, no wheezing, no rhonchi Abdominal: Soft, NT, ND, bowel sounds + Extremities: no edema, no cyanosis    The results of significant diagnostics from this hospitalization (including imaging, microbiology, ancillary and laboratory) are listed below for reference.     Microbiology: Recent Results (from the past 240 hour(s))  Blood Culture (routine x 2)     Status: Abnormal   Collection Time: 06/14/21  6:28 PM   Specimen: BLOOD  Result Value Ref Range Status   Specimen Description   Final    BLOOD BLOOD LEFT FOREARM Performed at [facility], [address] [place], [place] [address]    Special Requests   Final    BOTTLES DRAWN AEROBIC AND  ANAEROBIC Blood Culture results may not be optimal due to an inadequate volume of blood received in culture bottles Performed at [facility], [address] [place], [place] [address]    Culture  Setup Time   Final    GRAM NEGATIVE RODS IN BOTH AEROBIC AND ANAEROBIC BOTTLES CRITICAL RESULT CALLED TO, READ BACK BY AND VERIFIED WITH: DOVEN MITCHEAL'@0650'$  06/15/21 RH Performed at [facility], [place] [address] [place], [place] [address]    Culture ESCHERICHIA COLI (A)  Final   Report Status 06/17/2021 FINAL  Final   Organism ID, Bacteria ESCHERICHIA COLI  Final      Susceptibility   Escherichia coli - MIC*    AMPICILLIN >=32 RESISTANT Resistant     CEFAZOLIN <=4 SENSITIVE Sensitive     CEFEPIME <=0.12 SENSITIVE Sensitive     CEFTAZIDIME <=1 SENSITIVE Sensitive     CEFTRIAXONE <=0.25 SENSITIVE Sensitive     CIPROFLOXACIN <=0.25 SENSITIVE Sensitive     GENTAMICIN <=1 SENSITIVE Sensitive     IMIPENEM <=0.25 SENSITIVE Sensitive     TRIMETH/SULFA <=20 SENSITIVE Sensitive     AMPICILLIN/SULBACTAM 16 INTERMEDIATE Intermediate     PIP/TAZO <=4 SENSITIVE Sensitive     * ESCHERICHIA COLI  Urine Culture     Status: None   Collection Time: 06/14/21  6:28 PM   Specimen: In/Out Cath Urine  Result Value Ref Range Status   Specimen Description   Final    IN/OUT CATH URINE Performed at [facility], [address], [place], [place] [address]    Special Requests   Final    NONE Performed at [facility], [address], [place], [place] [address]    Culture   Final    NO GROWTH Performed at [facility], [place] [address], [place], [place] [address]    Report Status 06/17/2021 FINAL  Final  Blood Culture ID Panel (Reflexed)     Status: Abnormal   Collection Time: 06/14/21  6:28 PM  Result Value Ref Range Status   Enterococcus faecalis NOT DETECTED NOT DETECTED Final   Enterococcus Faecium NOT DETECTED NOT DETECTED Final   Listeria monocytogenes NOT  DETECTED NOT DETECTED Final   Staphylococcus species NOT DETECTED NOT DETECTED Final   Staphylococcus aureus (BCID) NOT DETECTED NOT DETECTED Final   Staphylococcus epidermidis NOT DETECTED NOT DETECTED Final   Staphylococcus lugdunensis NOT DETECTED NOT DETECTED Final   Streptococcus species NOT DETECTED NOT DETECTED Final   Streptococcus agalactiae NOT DETECTED NOT DETECTED Final   Streptococcus pneumoniae NOT DETECTED NOT DETECTED Final   Streptococcus pyogenes NOT DETECTED NOT DETECTED Final   A.calcoaceticus-baumannii NOT DETECTED NOT DETECTED Final   Bacteroides fragilis NOT DETECTED NOT DETECTED Final   Enterobacterales DETECTED (A) NOT DETECTED Final    Comment: Enterobacterales represent a large order of gram negative bacteria, not a single organism. CRITICAL RESULT CALLED TO, READ BACK BY AND VERIFIED WITH: DOVEN MITCHEAL'@0650'$  06/15/21 RH    Enterobacter cloacae complex NOT DETECTED NOT DETECTED Final   Escherichia coli DETECTED (A) NOT DETECTED Final    Comment: CRITICAL RESULT CALLED TO, READ BACK BY AND VERIFIED WITH: DOVEN MITCHEAL'@0650'$  06/15/21 RH    Klebsiella aerogenes NOT DETECTED NOT DETECTED Final   Klebsiella oxytoca NOT DETECTED NOT DETECTED Final   Klebsiella pneumoniae NOT DETECTED NOT DETECTED Final   Proteus species NOT DETECTED NOT DETECTED Final   Salmonella species NOT DETECTED NOT DETECTED Final   Serratia marcescens NOT DETECTED NOT DETECTED Final   Haemophilus influenzae NOT DETECTED NOT DETECTED Final   Neisseria meningitidis NOT DETECTED NOT DETECTED Final   Pseudomonas aeruginosa NOT DETECTED NOT DETECTED Final   Stenotrophomonas maltophilia NOT DETECTED NOT DETECTED Final   Candida albicans NOT DETECTED NOT DETECTED Final   Candida auris NOT DETECTED NOT DETECTED Final   Candida glabrata NOT DETECTED NOT DETECTED Final   Candida krusei NOT DETECTED NOT DETECTED Final   Candida parapsilosis NOT DETECTED NOT DETECTED Final   Candida tropicalis NOT  DETECTED NOT DETECTED Final   Cryptococcus neoformans/gattii NOT DETECTED NOT DETECTED Final   CTX-M ESBL NOT DETECTED NOT DETECTED Final   Carbapenem resistance IMP NOT DETECTED NOT DETECTED Final   Carbapenem resistance KPC NOT DETECTED NOT DETECTED Final   Carbapenem resistance NDM NOT DETECTED NOT DETECTED Final   Carbapenem resist OXA 48 LIKE NOT DETECTED NOT DETECTED Final   Carbapenem resistance VIM NOT DETECTED NOT DETECTED Final    Comment: Performed at [facility], [address] [place], [place] [address]  Blood Culture (routine x 2)     Status: None (Preliminary result)   Collection Time: 06/14/21  9:39 PM   Specimen: BLOOD  Result Value Ref Range Status   Specimen Description  BLOOD BLOOD RIGHT HAND  Final   Special Requests   Final    BOTTLES DRAWN AEROBIC AND ANAEROBIC Blood Culture adequate volume   Culture   Final    NO GROWTH 4 DAYS Performed at [facility], [address] [place], [place] [address]    Report Status PENDING  Incomplete  MRSA Next Gen by PCR, Nasal     Status: None   Collection Time: 06/16/21 12:44 AM   Specimen: Nasal Mucosa; Nasal Swab  Result Value Ref Range Status   MRSA by PCR Next Gen NOT DETECTED NOT DETECTED Final    Comment: (NOTE) The GeneXpert MRSA Assay (FDA approved for NASAL specimens only), is one component of a comprehensive MRSA colonization surveillance program. It is not intended to diagnose MRSA infection nor to guide or monitor treatment for MRSA infections. Test performance is not FDA approved in patients less than 67 years old. Performed at [facility], [address] [place], [place] [address]   Body fluid culture w Gram Stain     Status: None (Preliminary result)   Collection Time: 06/16/21  1:30 PM   Specimen: BILE; Body Fluid  Result Value Ref Range Status   Specimen Description   Final    BILE Performed at [facility], [address] [place], [place] [address]     Special Requests   Final    NONE Performed at [facility], [place], [place] [address]    Gram Stain NO WBC SEEN NO ORGANISMS SEEN   Final   Culture   Final    NO GROWTH < 24 HOURS Performed at [facility], [place] [address] [place], [place] [address]    Report Status PENDING  Incomplete     Labs: BNP (last 3 results) No results for input(s): BNP in the last 8760 hours. Basic Metabolic Panel: Recent Labs  Lab 06/14/21 1828 06/15/21 1054 06/16/21 0557 06/17/21 0738  NA 141 139 136 136  K 3.9 4.3 3.9 3.6  CL 105 104 101 100  CO2 '27 23 26 27  '$ GLUCOSE 115* 90 89 97  BUN '18 14 13 9  '$ CREATININE 1.08 0.99 0.88 0.96  CALCIUM 9.1 8.9 8.7* 8.8*   Liver Function Tests: Recent Labs  Lab 06/14/21 1828 06/15/21 1054 06/16/21 0557 06/17/21 0738  AST 169* 73* 45* 31  ALT 179* 106* 76* 53*  ALKPHOS 117 83 75 72  BILITOT 1.0 0.8 0.5 0.6  PROT 6.2* 5.4* 5.6* 6.0*  ALBUMIN 3.6 3.0* 3.1* 3.3*   Recent Labs  Lab 06/14/21 1828  LIPASE 25   No results for input(s): AMMONIA in the last 168 hours. CBC: Recent Labs  Lab 06/14/21 1828 06/15/21 1210 06/16/21 0557  WBC 15.9* 11.8* 7.0  NEUTROABS 14.6*  --   --   HGB 15.5 13.0 13.6  HCT 48.0 40.2 41.7  MCV 91.6 92.0 91.6  PLT 196 171 165   Cardiac Enzymes: No results for input(s): CKTOTAL, CKMB, CKMBINDEX, TROPONINI in the last 168 hours. BNP: Invalid input(s): POCBNP CBG: No results for input(s): GLUCAP in the last 168 hours. D-Dimer No results for input(s): DDIMER in the last 72 hours. Hgb A1c No results for input(s): HGBA1C in the last 72 hours. Lipid Profile No results for input(s): CHOL, HDL, LDLCALC, TRIG, CHOLHDL, LDLDIRECT in the last 72 hours. Thyroid function studies No results for input(s): TSH, T4TOTAL, T3FREE, THYROIDAB in the last 72 hours.  Invalid input(s): FREET3 Anemia work  up No results for input(s): VITAMINB12, FOLATE, FERRITIN, TIBC, IRON, RETICCTPCT in the last  72 hours. Urinalysis    Component Value Date/Time   COLORURINE YELLOW (A) 06/14/2021 1828   APPEARANCEUR CLEAR (A) 06/14/2021 1828   LABSPEC 1.020 06/14/2021 1828   PHURINE 6.0 06/14/2021 1828   GLUCOSEU NEGATIVE 06/14/2021 1828   HGBUR NEGATIVE 06/14/2021 1828   BILIRUBINUR NEGATIVE 06/14/2021 1828   KETONESUR 20 (A) 06/14/2021 1828   PROTEINUR NEGATIVE 06/14/2021 1828   UROBILINOGEN 0.2 08/31/2013 1723   NITRITE NEGATIVE 06/14/2021 1828   LEUKOCYTESUR NEGATIVE 06/14/2021 1828   Sepsis Labs Invalid input(s): PROCALCITONIN,  WBC,  LACTICIDVEN Microbiology Recent Results (from the past 240 hour(s))  Blood Culture (routine x 2)     Status: Abnormal   Collection Time: 06/14/21  6:28 PM   Specimen: BLOOD  Result Value Ref Range Status   Specimen Description   Final    BLOOD BLOOD LEFT FOREARM Performed at [facility], [address], [place], [place] [address]    Special Requests   Final    BOTTLES DRAWN AEROBIC AND ANAEROBIC Blood Culture results may not be optimal due to an inadequate volume of blood received in culture bottles Performed at [facility], [address] [place], [place] [address]    Culture  Setup Time   Final    GRAM NEGATIVE RODS IN BOTH AEROBIC AND ANAEROBIC BOTTLES CRITICAL RESULT CALLED TO, READ BACK BY AND VERIFIED WITH: DOVEN MITCHEAL'@0650'$  06/15/21 RH Performed at [facility], [place] [address] [place], [place] [address]    Culture ESCHERICHIA COLI (A)  Final   Report Status 06/17/2021 FINAL  Final   Organism ID, Bacteria ESCHERICHIA COLI  Final      Susceptibility   Escherichia coli - MIC*    AMPICILLIN >=32 RESISTANT Resistant     CEFAZOLIN <=4 SENSITIVE Sensitive     CEFEPIME <=0.12 SENSITIVE Sensitive     CEFTAZIDIME <=1 SENSITIVE Sensitive     CEFTRIAXONE <=0.25 SENSITIVE Sensitive     CIPROFLOXACIN <=0.25 SENSITIVE Sensitive     GENTAMICIN <=1 SENSITIVE Sensitive     IMIPENEM <=0.25 SENSITIVE Sensitive      TRIMETH/SULFA <=20 SENSITIVE Sensitive     AMPICILLIN/SULBACTAM 16 INTERMEDIATE Intermediate     PIP/TAZO <=4 SENSITIVE Sensitive     * ESCHERICHIA COLI  Urine Culture     Status: None   Collection Time: 06/14/21  6:28 PM   Specimen: In/Out Cath Urine  Result Value Ref Range Status   Specimen Description   Final    IN/OUT CATH URINE Performed at [facility], [address] [place], [place] [address]    Special Requests   Final    NONE Performed at [facility], [address] [place], [place] [address]    Culture   Final    NO GROWTH Performed at [facility], [place] [address], [place], [place] [address]    Report Status 06/17/2021 FINAL  Final  Blood Culture ID Panel (Reflexed)     Status: Abnormal   Collection Time: 06/14/21  6:28 PM  Result Value Ref Range Status   Enterococcus faecalis NOT DETECTED NOT DETECTED Final   Enterococcus Faecium NOT DETECTED NOT DETECTED Final   Listeria monocytogenes NOT DETECTED NOT DETECTED Final   Staphylococcus species NOT DETECTED NOT DETECTED Final   Staphylococcus aureus (BCID) NOT DETECTED NOT DETECTED Final   Staphylococcus epidermidis NOT DETECTED NOT DETECTED Final   Staphylococcus lugdunensis NOT  DETECTED NOT DETECTED Final   Streptococcus species NOT DETECTED NOT DETECTED Final   Streptococcus agalactiae NOT DETECTED NOT DETECTED Final   Streptococcus pneumoniae NOT DETECTED NOT DETECTED Final   Streptococcus pyogenes NOT DETECTED NOT DETECTED Final   A.calcoaceticus-baumannii NOT DETECTED NOT DETECTED Final   Bacteroides fragilis NOT DETECTED NOT DETECTED Final   Enterobacterales DETECTED (A) NOT DETECTED Final    Comment: Enterobacterales represent a large order of gram negative bacteria, not a single organism. CRITICAL RESULT CALLED TO, READ BACK BY AND VERIFIED WITH: DOVEN MITCHEAL'@0650'$  06/15/21 RH    Enterobacter cloacae complex NOT DETECTED NOT DETECTED Final   Escherichia coli DETECTED (A) NOT  DETECTED Final    Comment: CRITICAL RESULT CALLED TO, READ BACK BY AND VERIFIED WITH: DOVEN MITCHEAL'@0650'$  06/15/21 RH    Klebsiella aerogenes NOT DETECTED NOT DETECTED Final   Klebsiella oxytoca NOT DETECTED NOT DETECTED Final   Klebsiella pneumoniae NOT DETECTED NOT DETECTED Final   Proteus species NOT DETECTED NOT DETECTED Final   Salmonella species NOT DETECTED NOT DETECTED Final   Serratia marcescens NOT DETECTED NOT DETECTED Final   Haemophilus influenzae NOT DETECTED NOT DETECTED Final   Neisseria meningitidis NOT DETECTED NOT DETECTED Final   Pseudomonas aeruginosa NOT DETECTED NOT DETECTED Final   Stenotrophomonas maltophilia NOT DETECTED NOT DETECTED Final   Candida albicans NOT DETECTED NOT DETECTED Final   Candida auris NOT DETECTED NOT DETECTED Final   Candida glabrata NOT DETECTED NOT DETECTED Final   Candida krusei NOT DETECTED NOT DETECTED Final   Candida parapsilosis NOT DETECTED NOT DETECTED Final   Candida tropicalis NOT DETECTED NOT DETECTED Final   Cryptococcus neoformans/gattii NOT DETECTED NOT DETECTED Final   CTX-M ESBL NOT DETECTED NOT DETECTED Final   Carbapenem resistance IMP NOT DETECTED NOT DETECTED Final   Carbapenem resistance KPC NOT DETECTED NOT DETECTED Final   Carbapenem resistance NDM NOT DETECTED NOT DETECTED Final   Carbapenem resist OXA 48 LIKE NOT DETECTED NOT DETECTED Final   Carbapenem resistance VIM NOT DETECTED NOT DETECTED Final    Comment: Performed at [facility], [address] [place], [place] [address]  Blood Culture (routine x 2)     Status: None (Preliminary result)   Collection Time: 06/14/21  9:39 PM   Specimen: BLOOD  Result Value Ref Range Status   Specimen Description BLOOD BLOOD RIGHT HAND  Final   Special Requests   Final    BOTTLES DRAWN AEROBIC AND ANAEROBIC Blood Culture adequate volume   Culture   Final    NO GROWTH 4 DAYS Performed at [facility], [address] [place], [place] [address]     Report Status PENDING  Incomplete  MRSA Next Gen by PCR, Nasal     Status: None   Collection Time: 06/16/21 12:44 AM   Specimen: Nasal Mucosa; Nasal Swab  Result Value Ref Range Status   MRSA by PCR Next Gen NOT DETECTED NOT DETECTED Final    Comment: (NOTE) The GeneXpert MRSA Assay (FDA approved for NASAL specimens only), is one component of a comprehensive MRSA colonization surveillance program. It is not intended to diagnose MRSA infection nor to guide or monitor treatment for MRSA infections. Test performance is not FDA approved in patients less than 69 years old. Performed at [facility], [address] [place],  [address]   Body fluid culture w Gram Stain     Status: None (Preliminary result)   Collection Time: 06/16/21  1:30 PM   Specimen:  BILE; Body Fluid  Result Value Ref Range Status   Specimen Description   Final    BILE Performed at [facility], [address] [place], [place] [address]    Special Requests   Final    NONE Performed at [facility], [place], [place] [address]    Gram Stain NO WBC SEEN NO ORGANISMS SEEN   Final   Culture   Final    NO GROWTH < 24 HOURS Performed at [facility], [place] [address] [place], [place] [address]    Report Status PENDING  Incomplete     Time coordinating discharge: Over 30 minutes  SIGNED:   Sidney Ace, MD  [facility] 06/18/2021, 7:44 AM Pager   If 7PM-7AM, please contact night-coverage

## 2021-06-18 NOTE — NC FL2 (Signed)
[place] LEVEL OF CARE SCREENING TOOL     IDENTIFICATION  Patient Name: Robert Le Birthdate: 25-Oct-1940 Sex: male Admission Date (Current Location): 06/14/2021  [facility] and [place] Number:  Engineering geologist and Address:  [facility], [address] [place], [place] [address]      Provider Number: [id number]  Attending Physician Name and Address:  Sidney Ace, MD  Relative Name and Phone Number:       Current Level of Care: Hospital Recommended Level of Care: [place], Memory Care Prior Approval Number:    Date Approved/Denied:   PASRR Number:    Discharge Plan: Other (Comment) (Memory Care ALF)    Current Diagnoses: Patient Active Problem List   Diagnosis Date Noted   History of DVT (deep vein thrombosis) 06/15/2021   Acute cholecystitis 04/17/2021   Sepsis ([place]) 28/78/6767   Acute metabolic encephalopathy 20/94/7096   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 Finger foods.)  AMBULATORY STATUS COMMUNICATION OF NEEDS Skin   Total Care Verbally Normal                       Personal Care Assistance Level of Assistance  Total care       Total Care Assistance: Maximum assistance   Functional Limitations Info  Sight, Hearing, Speech Sight Info: Adequate Hearing Info: Adequate Speech Info: Adequate    SPECIAL CARE FACTORS FREQUENCY                       Contractures Contractures Info: Not present    Additional Factors Info  Code Status, Allergies Code Status Info: Full code Allergies  Info: Donepezil, Sulfites           Current Medications (06/18/2021):  This is the current hospital active medication list Current Facility-Administered Medications  Medication Dose Route Frequency Provider Last Rate Last Admin   acetaminophen (TYLENOL) tablet 650 mg  650 mg Oral Q6H PRN Para Skeans, MD   650 mg at 06/15/21 2037   Or   acetaminophen (TYLENOL) suppository 650 mg  650 mg Rectal Q6H PRN Para Skeans, MD       apixaban Arne Cleveland) tablet 5 mg  5 mg Oral BID Max Sane, MD   5 mg at 06/17/21 2124   buPROPion [facility]) tablet 150 mg  150 mg Oral BID Max Sane, MD   150 mg at 06/17/21 2124   chlorhexidine (PERIDEX) 0.12 % solution 15 mL  15 mL Mouth Rinse BID Max Sane, MD   15 mL at 06/17/21 2124   ciprofloxacin (CIPRO) tablet 500 mg  500 mg Oral BID Ralene Muskrat B, MD       donepezil (ARICEPT) tablet 5 mg  5 mg Oral Daily Florina Ou V, MD   5 mg at 06/17/21 0841   DULoxetine (CYMBALTA) DR capsule 60 mg  60 mg Oral Q breakfast Renda Rolls, RPH   60 mg at 06/17/21 0841   And   DULoxetine (CYMBALTA) DR capsule 30 mg  30 mg Oral q1600 Renda Rolls, [facility]  30 mg at 06/17/21 1700   MEDLINE mouth rinse  15 mL Mouth Rinse q12n4p Max Sane, MD   15 mL at 06/17/21 1700   memantine (NAMENDA) tablet 10 mg  10 mg Oral BID Para Skeans, MD   10 mg at 06/17/21 2124   morphine (PF) 2 MG/ML injection 2 mg  2 mg Intravenous Q4H PRN Para Skeans, MD       sodium chloride flush (NS) 0.9 % injection 3 mL  3 mL Intravenous Q12H Florina Ou V, MD   3 mL at 06/17/21 2124   sodium chloride flush (NS) 0.9 % injection 5 mL  5 mL Intracatheter Q8H Tsosie Billing D, PA-C   5 mL at 06/18/21 5038     Discharge Medications: STOP taking these medications     HYDROcodone-acetaminophen 5-325 MG tablet Commonly known as: NORCO/VICODIN           TAKE these medications     acetaminophen 500 MG tablet Commonly known as: TYLENOL Take 500 mg by mouth 3 (three) times daily as  needed for mild pain.    apixaban 5 MG Tabs tablet Commonly known as: ELIQUIS Take 1 tablet (5 mg total) by mouth 2 (two) times daily. What changed: Another medication with the same name was removed. Continue taking this medication, and follow the directions you see here.    buPROPion 75 MG tablet Commonly known as: WELLBUTRIN Take 150 mg by mouth 2 (two) times daily. What changed: Another medication with the same name was removed. Continue taking this medication, and follow the directions you see here.    ciprofloxacin 500 MG tablet Commonly known as: CIPRO Take 1 tablet (500 mg total) by mouth 2 (two) times daily for 10 days.    donepezil 5 MG tablet Commonly known as: ARICEPT Take 5 mg by mouth daily.    DULoxetine 30 MG capsule Commonly known as: CYMBALTA Take 30-60 mg by mouth 2 (two) times daily. 60 mg every morning and 30 mg daily at 4 pm    gabapentin 100 MG capsule Commonly known as: NEURONTIN Take 200 mg by mouth 2 (two) times daily.    gabapentin 600 MG tablet Commonly known as: NEURONTIN Take 600 mg by mouth daily.    memantine 10 MG tablet Commonly known as: NAMENDA Take 10 mg by mouth 2 (two) times daily.    multivitamin with minerals Tabs tablet Take 1 tablet by mouth daily.    polyethylene glycol 17 g packet Commonly known as: MIRALAX / GLYCOLAX Take 17 g by mouth every 3 (three) days.    Relevant Imaging Results:  Relevant Lab Results:   Additional Information SS#: [id number]. Has biliary drain.  Candie Chroman, LCSW

## 2021-06-19 LAB — CULTURE, BLOOD (ROUTINE X 2)
Culture: NO GROWTH
Special Requests: ADEQUATE

## 2021-06-20 LAB — BODY FLUID CULTURE W GRAM STAIN: Gram Stain: NONE SEEN

## 2021-06-21 DIAGNOSIS — K810 Acute cholecystitis: Secondary | ICD-10-CM | POA: Diagnosis not present

## 2021-07-05 ENCOUNTER — Telehealth: Payer: Self-pay

## 2021-07-05 NOTE — Telephone Encounter (Signed)
Transition Care Management Unsuccessful Follow-up Telephone Call  Date of discharge and from where:  06/18/21 / [facility]  Attempts:  1st Attempt  Reason for unsuccessful TCM follow-up call:  Left voice message with patients designated party releases:  Sherryle Lis and Patsi Sears RN,BSN,CCM RN Case Manager [place]  [phone]

## 2021-07-12 ENCOUNTER — Telehealth: Payer: Self-pay

## 2021-07-12 ENCOUNTER — Ambulatory Visit (INDEPENDENT_AMBULATORY_CARE_PROVIDER_SITE_OTHER): Payer: Medicare Other | Admitting: Surgery

## 2021-07-12 ENCOUNTER — Encounter: Payer: Self-pay | Admitting: Surgery

## 2021-07-12 VITALS — BP 131/89 | HR 80 | Temp 98.0°F | Wt 175.0 lb

## 2021-07-12 DIAGNOSIS — K810 Acute cholecystitis: Secondary | ICD-10-CM

## 2021-07-12 NOTE — Telephone Encounter (Signed)
Faxed medical clearance to Dr. Viviana Simpler at [phone].

## 2021-07-12 NOTE — Progress Notes (Signed)
07/12/2021  History of Present Illness: Robert Le is a 81 y.o. male presenting for follow-up of acute cholecystitis.  He was initially admitted in March 2023 with cholecystitis and sepsis and had a percutaneous cholecystostomy drain placed on 04/17/2021.  He had a cholangiogram on 06/10/2021 which showed a patent cystic duct and common bile duct and his drain was capped.  However he was then admitted to the hospital on 06/14/2021 with fever, bacteremia, and a high white blood cell count.  His drain was reconnected to bag drainage and bile cultures grew Staph epidermidis.  His blood cultures grew E. coli.  He was discharged with antibiotic course which she has since completed.  The patient presents today from his memory care [facility] residential area for follow-up and to discuss surgery for his gallbladder.  He is pleasantly confused given his history of dementia but overall denies any abdominal pain, nausea, vomiting.  Per records from the nursing home, it does appear that his drain has been getting flushed once daily although I do not have any record of how much volume is draining.  Past Medical History: Past Medical History:  Diagnosis Date   Alzheimer disease (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 Authorizing Provider  acetaminophen (TYLENOL) 500 MG tablet Take 500 mg by mouth 3  (three) times daily as needed for mild pain.   Yes [provider]  apixaban (ELIQUIS) 5 MG TABS tablet Take 1 tablet (5 mg total) by mouth 2 (two) times daily. 04/27/21 07/26/21 Yes Enzo Bi, MD  buPROPion (WELLBUTRIN) 75 MG tablet Take 150 mg by mouth 2 (two) times daily.   Yes [provider]  donepezil (ARICEPT) 5 MG tablet Take 5 mg by mouth daily. 02/23/21  Yes [provider]  DULoxetine (CYMBALTA) 30 MG capsule Take 30-60 mg by mouth 2 (two) times daily. 60 mg every morning and 30 mg daily at 4 pm   Yes [provider]  gabapentin (NEURONTIN) 100 MG capsule Take 200 mg by mouth 2 (two) times daily. 04/23/21  Yes [provider]  gabapentin (NEURONTIN) 600 MG tablet Take 600 mg by mouth daily. 04/23/21  Yes [provider]  memantine (NAMENDA) 10 MG tablet Take 10 mg by mouth 2 (two) times daily. 02/23/21  Yes [provider]  Multiple Vitamin (MULTIVITAMIN WITH MINERALS) TABS tablet Take 1 tablet by mouth daily. 05/05/16  Yes Venia Carbon, MD  polyethylene glycol (MIRALAX / GLYCOLAX) 17 g packet Take 17 g by mouth every 3 (three) days.   Yes [provider]    Allergies: Allergies  Allergen Reactions   Donepezil Other (See Comments)    Increased confusion Still listed on pt's current med list as of 06/14/2021   Sulfites     Unable to  recall reaction or specific sulfa drug    Review of Systems: Review of Systems  Unable to perform ROS: Dementia    Physical Exam BP 131/89   Pulse 80   Temp 98 F (36.7 C) (Oral)   Wt 175 lb (79.4 kg)   SpO2 97%   BMI 27.41 kg/m  CONSTITUTIONAL: No acute distress, well-nourished. HEENT:  Normocephalic, atraumatic, extraocular motion intact. NECK: Trachea is midline, no jugular venous distention. RESPIRATORY:  Lungs are clear, and breath sounds are equal bilaterally. Normal respiratory effort without pathologic use of accessory muscles. CARDIOVASCULAR: Heart is regular  without murmurs, gallops, or rubs. GI: The abdomen is soft, nondistended, nontender to palpation.  Right upper quadrant drain with dressing in place and with clear fluid in the bag.  Negative Murphy sign.  MUSCULOSKELETAL: No peripheral edema, however patient is on a wheelchair. NEUROLOGIC:  Motor and sensation is grossly normal.  Cranial nerves are grossly intact. PSYCH: Disoriented but very pleasant  Labs/Imaging: Labs from 06/14/2021: Sodium 141, potassium 3.9, chloride 105, CO2 27, BUN 18, creatinine 1.08.  Total bilirubin 1.0, AST 169, ALT 179, alkaline phosphatase 117, lipase 25, albumin 3.6.  WBC 15.9, hemoglobin 15.5, hematocrit 48.0, platelets 196.  CT abdomen/pelvis on 06/14/2021: IMPRESSION: 1. Percutaneous cholecystostomy tube is well positioned. Collapsed gallbladder with suggestion of mild diffuse gallbladder wall thickening, similar to prior CT and probably chronic. No radiopaque cholelithiasis. No biliary ductal dilatation. 2. Moderate rectal distention by stool, cannot exclude rectal stool impaction. No evidence of bowel obstruction or acute bowel inflammation. Mild sigmoid diverticulosis. 3. 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 4. Aortic Atherosclerosis (ICD10-I70.0).  Assessment and Plan: This is a 81 y.o. male with history of acute cholecystitis with a percutaneous cholecystostomy drain.  - Discussed with the patient that given the DVT that he had in the left lower extremity prior to his discharge in March 2023, I had wanted him to stay on Eliquis for at least 3 months so that we can more safely stop his Eliquis prior to doing surgery for his gallbladder.  Unfortunately he had to present again to the hospital last month due to bacteremia and fevers after his drinking.  Potentially had another episode of cholecystitis after the drain got capped and given his dementia, will be more  difficult to ascertain any specific symptoms or issues.  Now his drain is reconnected to drainage again and he has been doing better and his drain is getting flushed appropriately.  Now has been about 3 months since his DVT diagnosis likely will be more safe to proceed with stopping his Eliquis prior to surgery.  However we will also send for medical clearance to his PCP specifically to see if is possible to stop his Eliquis for surgery or not.  If so, he would need to be off his Eliquis 2 days prior to surgery. - Tentatively we will schedule him for surgery for robotic assisted cholecystectomy on 07/29/2021.  Last dose of Eliquis would be on 07/26/2021.  Will discuss with his son, Mr. Encarnacion Scioneaux, as he is his power of attorney about the surgery so that we can get consent for surgery as well. - All the patient's questions were answered.  I spent 40 minutes dedicated to the care of this patient on the date of this encounter to include pre-visit review of records, face-to-face time with the patient discussing diagnosis and management, and any post-visit coordination of care.  Melvyn Neth, [facility]

## 2021-07-12 NOTE — Patient Instructions (Signed)
Our surgery scheduler Barbara will call you within 24-48 hours to get you scheduled. If you have not heard from her after 48 hours, please call our office. Have the blue sheet available when she calls to write down important information.   If you have any concerns or questions, please feel free to call our office.    Minimally Invasive Cholecystectomy  Minimally invasive cholecystectomy is surgery to remove the gallbladder. The gallbladder is a pear-shaped organ that lies beneath the liver on the right side of the body. The gallbladder stores bile, which is a fluid that helps the body digest fats. Cholecystectomy is often done to treat inflammation (irritation and swelling) of the gallbladder (cholecystitis). This condition is usually caused by a buildup of gallstones (cholelithiasis) in the gallbladder or when the fluid in the gall bladder becomes stagnant because gallstones get stuck in the ducts (tubes) and block the flow of bile. This can result in inflammation and pain. In severe cases, emergency surgery may be required. This procedure is done through small incisions in the abdomen, instead of one large incision. It is also called laparoscopic surgery. A thin scope with a camera (laparoscope) is inserted through one incision. Then surgical instruments are inserted through the other incisions. In some cases, a minimally invasive surgery may need to be changed to a surgery that is done through a larger incision. This is called open surgery. Tell a health care provider about: Any allergies you have. All medicines you are taking, including vitamins, herbs, eye drops, creams, and over-the-counter medicines. Any problems you or family members have had with anesthetic medicines. Any bleeding problems you have. Any surgeries you have had. Any medical conditions you have. Whether you are pregnant or may be pregnant. What are the risks? Generally, this is a safe procedure. However, problems may occur,  including: Infection. Bleeding. Allergic reactions to medicines. Damage to nearby structures or organs. A gallstone remaining in the common bile duct. The common bile duct carries bile from the gallbladder to the small intestine. A bile leak from the liver or cystic duct after your gallbladder is removed. What happens before the procedure? When to stop eating and drinking Follow instructions from your health care provider about what you may eat and drink before your procedure. These may include: 8 hours before the procedure Stop eating most foods. Do not eat meat, fried foods, or fatty foods. Eat only light foods, such as toast or crackers. All liquids are okay except energy drinks and alcohol. 6 hours before the procedure Stop eating. Drink only clear liquids, such as water, clear fruit juice, black coffee, plain tea, and sports drinks. Do not drink energy drinks or alcohol. 2 hours before the procedure Stop drinking all liquids. You may be allowed to take medicines with small sips of water. If you do not follow your health care provider's instructions, your procedure may be delayed or canceled. Medicines Ask your health care provider about: Changing or stopping your regular medicines. This is especially important if you are taking diabetes medicines or blood thinners. Taking medicines such as aspirin and ibuprofen. These medicines can thin your blood. Do not take these medicines unless your health care provider tells you to take them. Taking over-the-counter medicines, vitamins, herbs, and supplements. General instructions If you will be going home right after the procedure, plan to have a responsible adult: Take you home from the hospital or clinic. You will not be allowed to drive. Care for you for the time you are   told. Do not use any products that contain nicotine or tobacco for at least 4 weeks before the procedure. These products include cigarettes, chewing tobacco, and vaping  devices, such as e-cigarettes. If you need help quitting, ask your health care provider. Ask your health care provider: How your surgery site will be marked. What steps will be taken to help prevent infection. These may include: Removing hair at the surgery site. Washing skin with a germ-killing soap. Taking antibiotic medicine. What happens during the procedure?  An IV will be inserted into one of your veins. You will be given one or both of the following: A medicine to help you relax (sedative). A medicine to make you fall asleep (general anesthetic). Your surgeon will make several small incisions in your abdomen. The laparoscope will be inserted through one of the small incisions. The camera on the laparoscope will send images to a monitor in the operating room. This lets your surgeon see inside your abdomen. A gas will be pumped into your abdomen. This will expand your abdomen to give the surgeon more room to perform the surgery. Other tools that are needed for the procedure will be inserted through the other incisions. The gallbladder will be removed through one of the incisions. Your common bile duct may be examined. If stones are found in the common bile duct, they may be removed. After your gallbladder has been removed, the incisions will be closed with stitches (sutures), staples, or skin glue. Your incisions will be covered with a bandage (dressing). The procedure may vary among health care providers and hospitals. What happens after the procedure? Your blood pressure, heart rate, breathing rate, and blood oxygen level will be monitored until you leave the hospital or clinic. You will be given medicines as needed to control your pain. You may have a drain placed in the incision. The drain will be removed a day or two after the procedure. Summary Minimally invasive cholecystectomy, also called laparoscopic cholecystectomy, is surgery to remove the gallbladder using small  incisions. Tell your health care provider about all the medical conditions you have and all the medicines you are taking for those conditions. Before the procedure, follow instructions about when to stop eating and drinking and changing or stopping medicines. Plan to have a responsible adult care for you for the time you are told after you leave the hospital or clinic. This information is not intended to replace advice given to you by your health care provider. Make sure you discuss any questions you have with your health care provider. Document Revised: 07/21/2020 Document Reviewed: 07/21/2020 Elsevier Patient Education  2023 [employer]  

## 2021-07-12 NOTE — H&P (View-Only) (Signed)
07/12/2021  History of Present Illness: Robert Le is a 81 y.o. male presenting for follow-up of acute cholecystitis.  He was initially admitted in March 2023 with cholecystitis and sepsis and had a percutaneous cholecystostomy drain placed on 04/17/2021.  He had a cholangiogram on 06/10/2021 which showed a patent cystic duct and common bile duct and his drain was capped.  However he was then admitted to the hospital on 06/14/2021 with fever, bacteremia, and a high white blood cell count.  His drain was reconnected to bag drainage and bile cultures grew Staph epidermidis.  His blood cultures grew E. coli.  He was discharged with antibiotic course which she has since completed.  The patient presents today from his memory care [facility] residential area for follow-up and to discuss surgery for his gallbladder.  He is pleasantly confused given his history of dementia but overall denies any abdominal pain, nausea, vomiting.  Per records from the nursing home, it does appear that his drain has been getting flushed once daily although I do not have any record of how much volume is draining.  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 Authorizing Provider  acetaminophen (TYLENOL) 500 MG tablet Take 500 mg by mouth 3  (three) times daily as needed for mild pain.   Yes [provider]  apixaban (ELIQUIS) 5 MG TABS tablet Take 1 tablet (5 mg total) by mouth 2 (two) times daily. 04/27/21 07/26/21 Yes Enzo Bi, MD  buPROPion (WELLBUTRIN) 75 MG tablet Take 150 mg by mouth 2 (two) times daily.   Yes [provider]  donepezil (ARICEPT) 5 MG tablet Take 5 mg by mouth daily. 02/23/21  Yes [provider]  DULoxetine (CYMBALTA) 30 MG capsule Take 30-60 mg by mouth 2 (two) times daily. 60 mg every morning and 30 mg daily at 4 pm   Yes [provider]  gabapentin (NEURONTIN) 100 MG capsule Take 200 mg by mouth 2 (two) times daily. 04/23/21  Yes [provider]  gabapentin (NEURONTIN) 600 MG tablet Take 600 mg by mouth daily. 04/23/21  Yes [provider]  memantine (NAMENDA) 10 MG tablet Take 10 mg by mouth 2 (two) times daily. 02/23/21  Yes [provider]  Multiple Vitamin (MULTIVITAMIN WITH MINERALS) TABS tablet Take 1 tablet by mouth daily. 05/05/16  Yes Venia Carbon, MD  polyethylene glycol (MIRALAX / GLYCOLAX) 17 g packet Take 17 g by mouth every 3 (three) days.   Yes [provider]    Allergies: Allergies  Allergen Reactions   Donepezil Other (See Comments)    Increased confusion Still listed on pt's current med list as of 06/14/2021   Sulfites     Unable to  recall reaction or specific sulfa drug    Review of Systems: Review of Systems  Unable to perform ROS: Dementia    Physical Exam BP 131/89   Pulse 80   Temp 98 F (36.7 C) (Oral)   Wt 175 lb (79.4 kg)   SpO2 97%   BMI 27.41 kg/m  CONSTITUTIONAL: No acute distress, well-nourished. HEENT:  Normocephalic, atraumatic, extraocular motion intact. NECK: Trachea is midline, no jugular venous distention. RESPIRATORY:  Lungs are clear, and breath sounds are equal bilaterally. Normal respiratory effort without pathologic use of accessory muscles. CARDIOVASCULAR: Heart is regular  without murmurs, gallops, or rubs. GI: The abdomen is soft, nondistended, nontender to palpation.  Right upper quadrant drain with dressing in place and with clear fluid in the bag.  Negative Murphy sign.  MUSCULOSKELETAL: No peripheral edema, however patient is on a wheelchair. NEUROLOGIC:  Motor and sensation is grossly normal.  Cranial nerves are grossly intact. PSYCH: Disoriented but very pleasant  Labs/Imaging: Labs from 06/14/2021: Sodium 141, potassium 3.9, chloride 105, CO2 27, BUN 18, creatinine 1.08.  Total bilirubin 1.0, AST 169, ALT 179, alkaline phosphatase 117, lipase 25, albumin 3.6.  WBC 15.9, hemoglobin 15.5, hematocrit 48.0, platelets 196.  CT abdomen/pelvis on 06/14/2021: IMPRESSION: 1. Percutaneous cholecystostomy tube is well positioned. Collapsed gallbladder with suggestion of mild diffuse gallbladder wall thickening, similar to prior CT and probably chronic. No radiopaque cholelithiasis. No biliary ductal dilatation. 2. Moderate rectal distention by stool, cannot exclude rectal stool impaction. No evidence of bowel obstruction or acute bowel inflammation. Mild sigmoid diverticulosis. 3. 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 4. Aortic Atherosclerosis (ICD10-I70.0).  Assessment and Plan: This is a 81 y.o. male with history of acute cholecystitis with a percutaneous cholecystostomy drain.  - Discussed with the patient that given the DVT that he had in the left lower extremity prior to his discharge in March 2023, I had wanted him to stay on Eliquis for at least 3 months so that we can more safely stop his Eliquis prior to doing surgery for his gallbladder.  Unfortunately he had to present again to the hospital last month due to bacteremia and fevers after his drinking.  Potentially had another episode of cholecystitis after the drain got capped and given his dementia, will be more  difficult to ascertain any specific symptoms or issues.  Now his drain is reconnected to drainage again and he has been doing better and his drain is getting flushed appropriately.  Now has been about 3 months since his DVT diagnosis likely will be more safe to proceed with stopping his Eliquis prior to surgery.  However we will also send for medical clearance to his PCP specifically to see if is possible to stop his Eliquis for surgery or not.  If so, he would need to be off his Eliquis 2 days prior to surgery. - Tentatively we will schedule him for surgery for robotic assisted cholecystectomy on 07/29/2021.  Last dose of Eliquis would be on 07/26/2021.  Will discuss with his son, Robert Le, as he is his power of attorney about the surgery so that we can get consent for surgery as well. - All the patient's questions were answered.  I spent 40 minutes dedicated to the care of this patient on the date of this encounter to include pre-visit review of records, face-to-face time with the patient discussing diagnosis and management, and any post-visit coordination of care.  Melvyn Neth, [facility]

## 2021-07-13 ENCOUNTER — Telehealth: Payer: Self-pay | Admitting: Surgery

## 2021-07-13 ENCOUNTER — Ambulatory Visit
Admission: RE | Admit: 2021-07-13 | Discharge: 2021-07-13 | Disposition: A | Discharge status: Home / Self Care | Payer: Medicare Other | Source: Ambulatory Visit | Attending: Interventional Radiology | Admitting: Interventional Radiology

## 2021-07-13 ENCOUNTER — Other Ambulatory Visit: Payer: Self-pay | Admitting: Interventional Radiology

## 2021-07-13 DIAGNOSIS — T85520D Displacement of bile duct prosthesis, subsequent encounter: Secondary | ICD-10-CM | POA: Diagnosis not present

## 2021-07-13 DIAGNOSIS — K810 Acute cholecystitis: Secondary | ICD-10-CM

## 2021-07-13 HISTORY — PX: IR FLUORO PROCEDURE UNLISTED: IMG2388

## 2021-07-13 NOTE — Telephone Encounter (Signed)
07/13/21:  Spoke via phone with the patient's POA, Mr. Robert Le, with regards to the patient's upcoming surgery.  He had been initially scheduled for robotic assisted cholecystectomy on 07/29/21, but we got notice that his drain had become dislodged and could not be replaced.  Thus, decision was made to move his surgery ahead of time to 07/16/21.  I spoke with Mr. Robert Le about the changes and he's in agreement.  Discussed with him the surgery at length including the incisions, the risks of bleeding, infection, injury to surrounding structures, that this is an outpatient surgery, the potential need for a drain, and he's willing to proceed and has given consent for Mr. Robert Le, as his POA.  Dr. Silvio Pate has cleared him for surgery as well and I have discussed the new date and when to stop his Eliquis with the patient's nurse Robert Le at [facility].   Olean Ree, MD

## 2021-07-13 NOTE — Anesthesia Preprocedure Evaluation (Addendum)
Anesthesia Evaluation  Patient identified by MRN, date of birth, ID band Patient confused    Reviewed: Allergy & Precautions, NPO status , Patient's Chart, lab work & pertinent test results  History of Anesthesia Complications (+) Emergence Delirium and history of anesthetic complications  Airway Mallampati: III  TM Distance: >3 FB Neck ROM: full    Dental  (+) Chipped   Pulmonary neg pulmonary ROS, neg shortness of breath,    Pulmonary exam normal breath sounds clear to auscultation       Cardiovascular Exercise Tolerance: Good hypertension, Pt. on medications negative cardio ROS Normal cardiovascular exam Rhythm:Regular     Neuro/Psych PSYCHIATRIC DISORDERS Dementia negative neurological ROS  negative psych ROS   GI/Hepatic negative GI ROS, Neg liver ROS,   Endo/Other  negative endocrine ROS  Renal/GU Renal disease     Musculoskeletal   Abdominal Normal abdominal exam  (+)   Peds negative pediatric ROS (+)  Hematology negative hematology ROS (+)   Anesthesia Other Findings Past Medical History: No date: Alzheimer disease ([place]) No date: Anxiety No date: Arthritis No date: Chronic kidney disease     Comment:  L TOTAL NEPHRECTOMY No date: Hypertension  Past Surgical History: No date: FRACTURE SURGERY     Comment:  [place] 06/10/2021: IR EXCHANGE BILIARY DRAIN 07/13/2021: IR FLUORO PROCEDURE UNLISTED 04/17/2021: IR PERC CHOLECYSTOSTOMY 1985: NEPHRECTOMY     Comment:  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 08/20/2013: TOTAL HIP ARTHROPLASTY; Left     Comment:  Procedure: LEFT TOTAL HIP ARTHROPLASTY ANTERIOR               APPROACH;  Surgeon: Mauri Pole, MD;  Location: [facility]               [facility];  Service: Orthopedics;  Laterality: Left;     Reproductive/Obstetrics negative OB ROS                         Anesthesia Physical Anesthesia Plan  ASA: 3  Anesthesia Plan:  General ETT and General   Post-op Pain Management:    Induction: Intravenous  PONV Risk Score and Plan: Ondansetron, Dexamethasone, Midazolam and Treatment may vary due to age or medical condition  Airway Management Planned: Oral ETT  Additional Equipment:   Intra-op Plan:   Post-operative Plan: Extubation in OR  Informed Consent: I have reviewed the patients History and Physical, chart, labs and discussed the procedure including the risks, benefits and alternatives for the proposed anesthesia with the patient or authorized representative who has indicated his/her understanding and acceptance.     Dental Advisory Given and Consent reviewed with POA  Plan Discussed with: Anesthesiologist, CRNA and Surgeon  Anesthesia Plan Comments: (History and phone consent from the patients sons Robert Le at [phone] and Robert Le [facility]) at [phone]   Sons consented for risks of anesthesia including but not limited to:  - increased risk for post operative cognitive dysfunction 2/2 the patients medical history - adverse reactions to medications - damage to eyes, teeth, lips or other oral mucosa - nerve damage due to positioning  - sore throat or hoarseness - Damage to heart, brain, nerves, lungs, other parts of body or loss of life  They voiced understanding.)     Anesthesia Quick Evaluation

## 2021-07-13 NOTE — Telephone Encounter (Signed)
Spoke with son, Dantae Meunier, he is made aware of the following regarding his dad's surgery who further states his dad is staying at [facility].      Pre-Admission date/time, COVID Testing date and Surgery date.  Surgery Date: 07/29/21 Preadmission Testing Date: 07/22/21 (phone 8a-1p) Covid Testing Date: Not needed.     Also they are made aware to call [phone], between 1-3:00pm the day before surgery, to find out what time to arrive for surgery.

## 2021-07-15 ENCOUNTER — Encounter
Admission: RE | Admit: 2021-07-15 | Discharge: 2021-07-15 | Disposition: A | Discharge status: Home / Self Care | Payer: Medicare Other | Source: Ambulatory Visit | Attending: Surgery | Admitting: Surgery

## 2021-07-15 ENCOUNTER — Ambulatory Visit: Admission: RE | Admit: 2021-07-15 | Payer: Medicare Other | Source: Ambulatory Visit | Admitting: Radiology

## 2021-07-15 HISTORY — DX: Atherosclerosis of aorta: I70.0

## 2021-07-15 HISTORY — DX: Metabolic encephalopathy: G93.41

## 2021-07-15 HISTORY — DX: Acute cholecystitis: K81.0

## 2021-07-15 HISTORY — DX: Personal history of other venous thrombosis and embolism: Z86.718

## 2021-07-15 HISTORY — DX: Sepsis, unspecified organism: A41.9

## 2021-07-15 NOTE — Pre-Procedure Instructions (Signed)
Pts son Mychael Smock is HCPOA. I spoke with Merry Proud over the phone who gave verbal consent over the phone for Dr Hampton Abbot to do his gallbladder surgery in the morning. Cherie Lucia Gaskins RN was the 2nd witness to this verbal consent. Surgery consent completed.   I called over to [facility] and asked Manuela Schwartz to send copy of HCPOA paperwork. Paperwork received from facility and placed on chart

## 2021-07-15 NOTE — Patient Instructions (Addendum)
Your procedure is scheduled on:07-16-21 Friday Report to the Registration Desk on the 1st floor of the [facility].Then proceed to the 2nd floor Surgery Desk. Arrive at 11am  REMEMBER: Instructions that are not followed completely may result in serious medical risk, up to and including death; or upon the discretion of your surgeon and anesthesiologist your surgery may need to be rescheduled.  Do not eat food OR drink any liquids after midnight the night before surgery.  No gum chewing, lozengers or hard candies.  DO NOT GIVE PATIENT ANY MEDICATION THE MORNING OF SURGERY  Last dose of apixaban Arne Cleveland) was on 07-13-21 Tuesday  One week prior to surgery: Stop Anti-inflammatories (NSAIDS) such as Advil, Aleve, Ibuprofen, Motrin, Naproxen, Naprosyn and Aspirin based products such as Excedrin, Goodys Powder, BC Powder. You may however, continue to take Tylenol if needed for pain up until the day of surgery.  No Alcohol for 24 hours before or after surgery.  No Smoking including e-cigarettes for 24 hours prior to surgery.  No chewable tobacco products for at least 6 hours prior to surgery.  No nicotine patches on the day of surgery.  Do not use any "recreational" drugs for at least a week prior to your surgery.  Please be advised that the combination of cocaine and anesthesia may have negative outcomes, up to and including death. If you test positive for cocaine, your surgery will be cancelled.  On the morning of surgery brush your teeth with toothpaste and water, you may rinse your mouth with mouthwash if you wish. Do not swallow any toothpaste or mouthwash.  Do not wear jewelry, make-up, hairpins, clips or nail polish.  Do not wear lotions, powders, or perfumes.   Do not shave body from the neck down 48 hours prior to surgery just in case you cut yourself which could leave a site for infection.  Also, freshly shaved skin may become irritated if using the CHG soap.  Contact lenses,  hearing aids and dentures may not be worn into surgery.  Do not bring valuables to the hospital. [facility] is not responsible for any missing/lost belongings or valuables.   Notify your doctor if there is any change in your medical condition (cold, fever, infection).  Wear comfortable clothing (specific to your surgery type) to the hospital.  After surgery, you can help prevent lung complications by doing breathing exercises.  Take deep breaths and cough every 1-2 hours. Your doctor may order a device called an Incentive Spirometer to help you take deep breaths. When coughing or sneezing, hold a pillow firmly against your incision with both hands. This is called "splinting." Doing this helps protect your incision. It also decreases belly discomfort.  If you are being admitted to the hospital overnight, leave your suitcase in the car. After surgery it may be brought to your room.  If you are being discharged the day of surgery, you will not be allowed to drive home. You will need a responsible adult (18 years or older) to drive you home and stay with you that night.   If you are taking public transportation, you will need to have a responsible adult (18 years or older) with you. Please confirm with your physician that it is acceptable to use public transportation.   Please call the [place] Dept. at [phone] if you have any questions about these instructions.  Surgery Visitation Policy:  Patients undergoing a surgery or procedure may have two family members or support persons with them  as long as the person is not COVID-19 positive or experiencing its symptoms.

## 2021-07-15 NOTE — Pre-Procedure Instructions (Signed)
Spoke with Manuela Schwartz, nurse at [facility]. Faxed surgery instructions to her which she did receive. Went over instruction over the phone with Manuela Schwartz about NPO after mn tonight. No meds to be given to patient in the morning as pills have to be crushed and placed in applesauce in facility. Manuela Schwartz verbalized that she understoood not to give pt any meds in am. [facility] to bring pt to hospital and [name], pts son will be with him. Manuela Schwartz aware that pt needs to be at the hospital at 11 am

## 2021-07-16 ENCOUNTER — Other Ambulatory Visit: Payer: Self-pay

## 2021-07-16 ENCOUNTER — Encounter
Admission: RE | Disposition: A | Discharge status: Home / Self Care | Payer: Self-pay | Source: Home / Self Care | Attending: Surgery

## 2021-07-16 ENCOUNTER — Ambulatory Visit: Payer: Medicare Other | Admitting: Anesthesiology

## 2021-07-16 ENCOUNTER — Ambulatory Visit
Admission: RE | Admit: 2021-07-16 | Discharge: 2021-07-16 | Disposition: A | Discharge status: Home / Self Care | Payer: Medicare Other | Attending: Surgery | Admitting: Surgery

## 2021-07-16 ENCOUNTER — Encounter: Payer: Self-pay | Admitting: Surgery

## 2021-07-16 DIAGNOSIS — G309 Alzheimer's disease, unspecified: Secondary | ICD-10-CM | POA: Diagnosis not present

## 2021-07-16 DIAGNOSIS — N183 Chronic kidney disease, stage 3 unspecified: Secondary | ICD-10-CM | POA: Diagnosis not present

## 2021-07-16 DIAGNOSIS — Z96642 Presence of left artificial hip joint: Secondary | ICD-10-CM | POA: Insufficient documentation

## 2021-07-16 DIAGNOSIS — F0280 Dementia in other diseases classified elsewhere without behavioral disturbance: Secondary | ICD-10-CM | POA: Insufficient documentation

## 2021-07-16 DIAGNOSIS — I82402 Acute embolism and thrombosis of unspecified deep veins of left lower extremity: Secondary | ICD-10-CM

## 2021-07-16 DIAGNOSIS — G479 Sleep disorder, unspecified: Secondary | ICD-10-CM

## 2021-07-16 DIAGNOSIS — F419 Anxiety disorder, unspecified: Secondary | ICD-10-CM | POA: Diagnosis not present

## 2021-07-16 DIAGNOSIS — M1990 Unspecified osteoarthritis, unspecified site: Secondary | ICD-10-CM | POA: Insufficient documentation

## 2021-07-16 DIAGNOSIS — K810 Acute cholecystitis: Secondary | ICD-10-CM | POA: Diagnosis present

## 2021-07-16 DIAGNOSIS — Z79899 Other long term (current) drug therapy: Secondary | ICD-10-CM | POA: Insufficient documentation

## 2021-07-16 DIAGNOSIS — N189 Chronic kidney disease, unspecified: Secondary | ICD-10-CM | POA: Diagnosis not present

## 2021-07-16 DIAGNOSIS — M5416 Radiculopathy, lumbar region: Secondary | ICD-10-CM

## 2021-07-16 DIAGNOSIS — K811 Chronic cholecystitis: Secondary | ICD-10-CM | POA: Insufficient documentation

## 2021-07-16 DIAGNOSIS — F39 Unspecified mood [affective] disorder: Secondary | ICD-10-CM | POA: Diagnosis not present

## 2021-07-16 DIAGNOSIS — Z905 Acquired absence of kidney: Secondary | ICD-10-CM | POA: Diagnosis not present

## 2021-07-16 DIAGNOSIS — I129 Hypertensive chronic kidney disease with stage 1 through stage 4 chronic kidney disease, or unspecified chronic kidney disease: Secondary | ICD-10-CM | POA: Insufficient documentation

## 2021-07-16 DIAGNOSIS — I10 Essential (primary) hypertension: Secondary | ICD-10-CM | POA: Diagnosis not present

## 2021-07-16 SURGERY — CHOLECYSTECTOMY, ROBOT-ASSISTED, LAPAROSCOPIC
Anesthesia: General | Site: Abdomen

## 2021-07-16 MED ORDER — BUPIVACAINE LIPOSOME 1.3 % IJ SUSP
INTRAMUSCULAR | Status: AC
Start: 2021-07-16 — End: ?
  Filled 2021-07-16: qty 10

## 2021-07-16 MED ORDER — FAMOTIDINE 20 MG PO TABS
ORAL_TABLET | ORAL | Status: AC
  Filled 2021-07-16: qty 1

## 2021-07-16 MED ORDER — GABAPENTIN 300 MG PO CAPS
ORAL_CAPSULE | ORAL | Status: AC
  Filled 2021-07-16: qty 1

## 2021-07-16 MED ORDER — ACETAMINOPHEN 500 MG PO TABS
ORAL_TABLET | ORAL | Status: AC
  Filled 2021-07-16: qty 2

## 2021-07-16 MED ORDER — ONDANSETRON HCL 4 MG/2ML IJ SOLN
INTRAMUSCULAR | Status: DC | PRN
  Administered 2021-07-16: 4 mg via INTRAVENOUS

## 2021-07-16 MED ORDER — ACETAMINOPHEN 500 MG PO TABS: 1000.0000 mg | ORAL_TABLET | Freq: Four times a day (QID) | ORAL | Status: DC | PRN

## 2021-07-16 MED ORDER — SODIUM CHLORIDE 0.9 % IR SOLN
Status: DC | PRN
  Administered 2021-07-16: 1000 mL

## 2021-07-16 MED ORDER — BUPIVACAINE-EPINEPHRINE (PF) 0.25% -1:200000 IJ SOLN
INTRAMUSCULAR | Status: DC | PRN
  Administered 2021-07-16: 40 mL via INTRAMUSCULAR

## 2021-07-16 MED ORDER — GABAPENTIN 100 MG PO CAPS: 200.0000 mg | ORAL_CAPSULE | ORAL | Status: DC

## 2021-07-16 MED ORDER — LABETALOL HCL 5 MG/ML IV SOLN
INTRAVENOUS | Status: DC | PRN
  Administered 2021-07-16: 5 mg via INTRAVENOUS

## 2021-07-16 MED ORDER — CEFAZOLIN SODIUM-DEXTROSE 2-4 GM/100ML-% IV SOLN
INTRAVENOUS | Status: AC
  Filled 2021-07-16: qty 100

## 2021-07-16 MED ORDER — ESMOLOL HCL 100 MG/10ML IV SOLN
INTRAVENOUS | Status: AC
  Filled 2021-07-16: qty 10

## 2021-07-16 MED ORDER — ONDANSETRON HCL 4 MG/2ML IJ SOLN: 4.0000 mg | Freq: Once | INTRAMUSCULAR | Status: DC | PRN

## 2021-07-16 MED ORDER — CEFAZOLIN SODIUM-DEXTROSE 2-4 GM/100ML-% IV SOLN
2.0000 g | INTRAVENOUS | Status: AC
  Administered 2021-07-16: 2 g via INTRAVENOUS

## 2021-07-16 MED ORDER — ACETAMINOPHEN 10 MG/ML IV SOLN
INTRAVENOUS | Status: DC | PRN
  Administered 2021-07-16: 1000 mg via INTRAVENOUS

## 2021-07-16 MED ORDER — ROCURONIUM BROMIDE 100 MG/10ML IV SOLN
INTRAVENOUS | Status: DC | PRN
  Administered 2021-07-16: 50 mg via INTRAVENOUS
  Administered 2021-07-16 (×2): 10 mg via INTRAVENOUS
  Administered 2021-07-16: 20 mg via INTRAVENOUS

## 2021-07-16 MED ORDER — ESMOLOL HCL-SODIUM CHLORIDE 2000 MG/100ML IV SOLN
INTRAVENOUS | Status: DC | PRN
  Administered 2021-07-16: 20 mg via INTRAVENOUS

## 2021-07-16 MED ORDER — LIDOCAINE HCL (CARDIAC) PF 100 MG/5ML IV SOSY
PREFILLED_SYRINGE | INTRAVENOUS | Status: DC | PRN
  Administered 2021-07-16: 80 mg via INTRAVENOUS
  Administered 2021-07-16: 60 mg via INTRAVENOUS

## 2021-07-16 MED ORDER — CHLORHEXIDINE GLUCONATE 0.12 % MT SOLN
OROMUCOSAL | Status: AC
  Administered 2021-07-16: 15 mL via OROMUCOSAL
  Filled 2021-07-16: qty 15

## 2021-07-16 MED ORDER — BUPIVACAINE-EPINEPHRINE (PF) 0.25% -1:200000 IJ SOLN
INTRAMUSCULAR | Status: AC
  Filled 2021-07-16: qty 30

## 2021-07-16 MED ORDER — CHLORHEXIDINE GLUCONATE 0.12 % MT SOLN: 15.0000 mL | Freq: Once | OROMUCOSAL | Status: AC

## 2021-07-16 MED ORDER — SUGAMMADEX SODIUM 200 MG/2ML IV SOLN
INTRAVENOUS | Status: DC | PRN
  Administered 2021-07-16: 200 mg via INTRAVENOUS

## 2021-07-16 MED ORDER — ACETAMINOPHEN 10 MG/ML IV SOLN
INTRAVENOUS | Status: AC
Start: 2021-07-16 — End: ?
  Filled 2021-07-16: qty 100

## 2021-07-16 MED ORDER — FAMOTIDINE IN NACL 20-0.9 MG/50ML-% IV SOLN
20.0000 mg | Freq: Once | INTRAVENOUS | Status: AC
  Administered 2021-07-16: 20 mg via INTRAVENOUS
  Filled 2021-07-16: qty 50

## 2021-07-16 MED ORDER — VASOPRESSIN 20 UNIT/ML IV SOLN
INTRAVENOUS | Status: AC
  Filled 2021-07-16: qty 1

## 2021-07-16 MED ORDER — CHLORHEXIDINE GLUCONATE CLOTH 2 % EX PADS
6.0000 | MEDICATED_PAD | Freq: Once | CUTANEOUS | Status: AC
  Administered 2021-07-16: 6 via TOPICAL

## 2021-07-16 MED ORDER — OXYCODONE HCL 5 MG PO TABS: 5.0000 mg | ORAL_TABLET | ORAL | 0 refills | Status: DC | PRN

## 2021-07-16 MED ORDER — LACTATED RINGERS IV SOLN: INTRAVENOUS | Status: DC

## 2021-07-16 MED ORDER — FENTANYL CITRATE (PF) 100 MCG/2ML IJ SOLN: 25.0000 ug | INTRAMUSCULAR | Status: DC | PRN

## 2021-07-16 MED ORDER — FENTANYL CITRATE (PF) 100 MCG/2ML IJ SOLN
INTRAMUSCULAR | Status: DC | PRN
Start: 2021-07-16 — End: 2021-07-16
  Administered 2021-07-16: 25 ug via INTRAVENOUS
  Administered 2021-07-16: 50 ug via INTRAVENOUS
  Administered 2021-07-16: 25 ug via INTRAVENOUS

## 2021-07-16 MED ORDER — 0.9 % SODIUM CHLORIDE (POUR BTL) OPTIME
TOPICAL | Status: DC | PRN
  Administered 2021-07-16: 500 mL

## 2021-07-16 MED ORDER — PHENYLEPHRINE HCL (PRESSORS) 10 MG/ML IV SOLN
INTRAVENOUS | Status: DC | PRN
  Administered 2021-07-16: 80 ug via INTRAVENOUS
  Administered 2021-07-16: 120 ug via INTRAVENOUS
  Administered 2021-07-16: 40 ug via INTRAVENOUS
  Administered 2021-07-16: 80 ug via INTRAVENOUS

## 2021-07-16 MED ORDER — PROPOFOL 10 MG/ML IV BOLUS
INTRAVENOUS | Status: DC | PRN
  Administered 2021-07-16: 90 mg via INTRAVENOUS

## 2021-07-16 MED ORDER — INDOCYANINE GREEN 25 MG IV SOLR
2.5000 mg | INTRAVENOUS | Status: AC
  Administered 2021-07-16: 2.5 mg via INTRAVENOUS
  Filled 2021-07-16: qty 1

## 2021-07-16 MED ORDER — BUPIVACAINE LIPOSOME 1.3 % IJ SUSP: 20.0000 mL | Freq: Once | INTRAMUSCULAR | Status: DC

## 2021-07-16 MED ORDER — CHLORHEXIDINE GLUCONATE CLOTH 2 % EX PADS: 6.0000 | MEDICATED_PAD | Freq: Once | CUTANEOUS | Status: DC

## 2021-07-16 MED ORDER — PHENYLEPHRINE HCL-NACL 20-0.9 MG/250ML-% IV SOLN
INTRAVENOUS | Status: AC
  Filled 2021-07-16: qty 250

## 2021-07-16 MED ORDER — ORAL CARE MOUTH RINSE: 15.0000 mL | Freq: Once | OROMUCOSAL | Status: AC

## 2021-07-16 MED ORDER — ACETAMINOPHEN 500 MG PO TABS: 1000.0000 mg | ORAL_TABLET | ORAL | Status: DC

## 2021-07-16 MED ORDER — ONDANSETRON HCL 4 MG/2ML IJ SOLN
INTRAMUSCULAR | Status: AC
  Filled 2021-07-16: qty 2

## 2021-07-16 MED ORDER — FENTANYL CITRATE (PF) 100 MCG/2ML IJ SOLN
INTRAMUSCULAR | Status: AC
  Filled 2021-07-16: qty 2

## 2021-07-16 SURGICAL SUPPLY — 59 items
BAG PRESSURE INF REUSE 1000 (BAG) ×1 IMPLANT
BAG RETRIEVAL 10 (BASKET) ×1
BULB RESERV EVAC DRAIN JP 100C (MISCELLANEOUS) ×1 IMPLANT
CANNULA REDUC XI 12-8 STAPL (CANNULA) ×2
CANNULA REDUCER 12-8 DVNC XI (CANNULA) ×1 IMPLANT
CLIP LIGATING HEMO O LOK GREEN (MISCELLANEOUS) ×2 IMPLANT
CUP MEDICINE 2OZ PLAST GRAD ST (MISCELLANEOUS) ×2 IMPLANT
DERMABOND ADVANCED (GAUZE/BANDAGES/DRESSINGS) ×1
DERMABOND ADVANCED .7 DNX12 (GAUZE/BANDAGES/DRESSINGS) ×1 IMPLANT
DRAIN CHANNEL 19F RND (DRAIN) ×1 IMPLANT
DRAPE ARM DVNC X/XI (DISPOSABLE) ×4 IMPLANT
DRAPE COLUMN DVNC XI (DISPOSABLE) ×1 IMPLANT
DRAPE DA VINCI XI ARM (DISPOSABLE) ×8
DRAPE DA VINCI XI COLUMN (DISPOSABLE) ×2
DRSG TEGADERM 4X4.75 (GAUZE/BANDAGES/DRESSINGS) ×2 IMPLANT
ELECT CAUTERY BLADE TIP 2.5 (TIP) ×2
ELECT REM PT RETURN 9FT ADLT (ELECTROSURGICAL) ×2
ELECTRODE CAUTERY BLDE TIP 2.5 (TIP) ×1 IMPLANT
ELECTRODE REM PT RTRN 9FT ADLT (ELECTROSURGICAL) ×1 IMPLANT
GLOVE SURG SYN 7.0 (GLOVE) ×24 IMPLANT
GLOVE SURG SYN 7.0 PF PI (GLOVE) ×2 IMPLANT
GLOVE SURG SYN 7.5  E (GLOVE) ×20
GLOVE SURG SYN 7.5 E (GLOVE) ×10 IMPLANT
GLOVE SURG SYN 7.5 PF PI (GLOVE) ×2 IMPLANT
GOWN STRL REUS W/ TWL LRG LVL3 (GOWN DISPOSABLE) ×4 IMPLANT
GOWN STRL REUS W/TWL LRG LVL3 (GOWN DISPOSABLE) ×4
IRRIGATOR SUCT 8 DISP DVNC XI (IRRIGATION / IRRIGATOR) IMPLANT
IRRIGATOR SUCTION 8MM XI DISP (IRRIGATION / IRRIGATOR) ×2
IV NS 1000ML (IV SOLUTION) ×2
IV NS 1000ML BAXH (IV SOLUTION) IMPLANT
KIT PINK PAD W/HEAD ARE REST (MISCELLANEOUS) ×2
KIT PINK PAD W/HEAD ARM REST (MISCELLANEOUS) ×1 IMPLANT
LABEL OR SOLS (LABEL) ×2 IMPLANT
MANIFOLD NEPTUNE II (INSTRUMENTS) ×2 IMPLANT
NEEDLE HYPO 22GX1.5 SAFETY (NEEDLE) ×2 IMPLANT
NS IRRIG 500ML POUR BTL (IV SOLUTION) ×2 IMPLANT
OBTURATOR OPTICAL STANDARD 8MM (TROCAR) ×2
OBTURATOR OPTICAL STND 8 DVNC (TROCAR) ×1
OBTURATOR OPTICALSTD 8 DVNC (TROCAR) ×1 IMPLANT
PACK LAP CHOLECYSTECTOMY (MISCELLANEOUS) ×2 IMPLANT
PENCIL ELECTRO HAND CTR (MISCELLANEOUS) ×2 IMPLANT
SEAL CANN UNIV 5-8 DVNC XI (MISCELLANEOUS) ×3 IMPLANT
SEAL XI 5MM-8MM UNIVERSAL (MISCELLANEOUS) ×6
SET TUBE SMOKE EVAC HIGH FLOW (TUBING) ×2 IMPLANT
SOLUTION ELECTROLUBE (MISCELLANEOUS) ×2 IMPLANT
SPONGE DRAIN TRACH 4X4 STRL 2S (GAUZE/BANDAGES/DRESSINGS) ×1 IMPLANT
SPONGE T-LAP 18X18 ~~LOC~~+RFID (SPONGE) ×1 IMPLANT
SPONGE T-LAP 4X18 ~~LOC~~+RFID (SPONGE) ×2 IMPLANT
STAPLER CANNULA SEAL DVNC XI (STAPLE) ×1 IMPLANT
STAPLER CANNULA SEAL XI (STAPLE) ×2
SUT ETHILON 2 0 FS 18 (SUTURE) ×1 IMPLANT
SUT ETHILON 3-0 FS-10 30 BLK (SUTURE) ×2
SUT MNCRL AB 4-0 PS2 18 (SUTURE) ×3 IMPLANT
SUT VIC AB 3-0 SH 27 (SUTURE)
SUT VIC AB 3-0 SH 27X BRD (SUTURE) IMPLANT
SUT VICRYL 0 AB UR-6 (SUTURE) ×4 IMPLANT
SUTURE EHLN 3-0 FS-10 30 BLK (SUTURE) IMPLANT
SYS BAG RETRIEVAL 10MM (BASKET) ×1
SYSTEM BAG RETRIEVAL 10MM (BASKET) ×1 IMPLANT

## 2021-07-16 NOTE — Discharge Instructions (Addendum)
Discharge Instructions: 1.  Patient may shower, but do not scrub wounds heavily and dab dry only. 2.  Do not submerge wounds in pool/tub until fully healed. 3.  Do not apply ointments or hydrogen peroxide to the wounds. 4.  Dressing change:  Please change the drain dressing with dry gauze once daily and as needed to keep the area clean and dry.  May secure with tape. 5.  Drain care:  Please empty and record drain output BID and as needed to maintain good suction on the bulb. 6.  May resume patient's Eliquis on 07/18/21. 7.  We'll set up follow up appointment on 6/22 for possible drain removal.  New Patient Orders: Please change dressing around the drain with 4x4 gauze once daily and as needed to keep the area clean and dry.  May secure with tape. Please empty and record drain output BID and as needed to maintain good suction on the bulb. Please resume patient's Eliquis on 07/18/21. May take Tylenol 1000 mg q6 hours as needed for mild pain. May take Oxycodone 5 mg q6 hours as needed for moderate or severe pain.  AMBULATORY SURGERY  DISCHARGE INSTRUCTIONS   The drugs that you were given will stay in your system until tomorrow so for the next 24 hours you should not:  Drive an automobile Make any legal decisions Drink any alcoholic beverage   You may resume regular meals tomorrow.  Today it is better to start with liquids and gradually work up to solid foods.  You may eat anything you prefer, but it is better to start with liquids, then soup and crackers, and gradually work up to solid foods.   Please notify your doctor immediately if you have any unusual bleeding, trouble breathing, redness and pain at the surgery site, drainage, fever, or pain not relieved by medication.    Additional Instructions:  PLEASE DO NOT REMOVE GREEN ARMBAND FOR 4 DAYS     Please contact your physician with any problems or Same Day Surgery at [phone], Monday through Friday 6 am to 4 pm, or [facility] at [facility] number at [phone].     JP Drain [employer] this sheet to all of your post-operative appointments while you have your drains. Please measure your drains by CC's or ML's. Make sure you drain and measure your JP Drains 3 times per day. At the end of each day, add up totals for the left side and add up totals for the right side.    ( 9 am )     ( 3 pm )        ( 9 pm )                Date L  R  L  R  L  R  Total L/R

## 2021-07-16 NOTE — Op Note (Signed)
Procedure Date:  07/16/2021  Pre-operative Diagnosis:  History of acute cholecystitis  Post-operative Diagnosis: History of acute cholecystitis  Procedure:  Robotic assisted cholecystectomy with ICG FireFly cholangiogram  Surgeon:  Melvyn Neth, MD  Assistant:  Darnelle Bos, PA-S  Anesthesia:  General endotracheal  Estimated Blood Loss:  50 ml  Specimens:  gallbladder  Complications:  None  Indications for Procedure:  This is a 81 y.o. male who presents with a recent history of acute cholecystitis complicated by sepsis and bacteremia, requiring percutaneous cholecystostomy drain.  He now presents for cholecystectomy.  The benefits, complications, treatment options, and expected outcomes were discussed with the patient. The risks of bleeding, infection, recurrence of symptoms, failure to resolve symptoms, bile duct damage, bile duct leak, retained common bile duct stone, bowel injury, and need for further procedures were all discussed with the patient and he was willing to proceed.  Description of Procedure: The patient was correctly identified in the preoperative area and brought into the operating room.  The patient was placed supine with VTE prophylaxis in place.  Appropriate time-outs were performed.  Anesthesia was induced and the patient was intubated.  Appropriate antibiotics were infused.  The abdomen was prepped and draped in a sterile fashion. An infraumbilical incision was made. A cutdown technique was used to enter the abdominal cavity without injury, and a 12 mm robotic port was inserted.  Pneumoperitoneum was obtained with appropriate opening pressures.  Three 8-mm ports were placed in the mid abdomen at the level of the umbilicus under direct visualization.  The DaVinci platform was docked, camera targeted, and instruments were placed under direct visualization.  The gallbladder was completely surrounded by omentum and mesentery.  This was carefully dissected to avoid any  injury to possible underlying bowel.  There was also a lot of adhesions of the liver to the anterior abdominal wall.  The track of the prior drain was also dissected and transected.  This allowed visualization of the gallbladder.  Any bleeding was controlled with cautery.  The fundus was grasped and retracted cephalad.  Adhesions were lysed bluntly and with electrocautery. The infundibulum was grasped and retracted laterally, exposing the peritoneum overlying the gallbladder.  This also had a lot of thick inflammatory changes.  This was incised with electrocautery and extended on either side of the gallbladder.  FireFly cholangiogram was then obtained, and we were able to clearly identify the cystic duct and common bile duct.  The cystic duct and cystic artery were carefully dissected with combination of cautery and blunt dissection.  FireFly was used multiple times to guide our dissection given all the inflammation.  Both were clipped twice proximally and once distally, cutting in between.  The gallbladder was taken from the gallbladder fossa in a retrograde fashion with electrocautery. The gallbladder was placed in an Endocatch bag. The liver bed was inspected and any bleeding was controlled with electrocautery. The right upper quadrant was then inspected again revealing intact clips, no bleeding, and no ductal injury.  The area was thoroughly irrigated.  Due to all the inflammation, some bile spillage, and bleeding, it was decided to leave a 19 Fr. Blake drain which was inserted via the right lateral port to the RUQ.  The 8 mm ports were removed under direct visualization and the 12 mm port was removed.  The Endocatch bag was brought out via the umbilical incision. The fascial opening was closed using 0 vicryl suture.  Local anesthetic was infused in all incisions and the incisions  were closed with 4-0 Monocryl.  The wounds were cleaned and sealed with DermaBond.  The drain was secured using 2-0 Nylon and the  drain was dressed with 4x4 gauze and TegaDerm.  The patient was emerged from anesthesia and extubated and brought to the recovery room for further management.  The patient tolerated the procedure well and all counts were correct at the end of the case.   Melvyn Neth, MD

## 2021-07-16 NOTE — Anesthesia Procedure Notes (Signed)
Procedure Name: Intubation Date/Time: 07/16/2021 1:13 PM  Performed by: Janiel Crisostomo, CRNAPre-anesthesia Checklist: Patient identified, Patient being monitored, Timeout performed, Emergency Drugs available and Suction available Patient Re-evaluated:Patient Re-evaluated prior to induction Oxygen Delivery Method: Circle system utilized Preoxygenation: Pre-oxygenation with 100% oxygen Induction Type: IV induction Ventilation: Mask ventilation without difficulty Laryngoscope Size: Miller and 2 Grade View: Grade I Tube type: Oral Tube size: 7.5 mm Number of attempts: 1 Airway Equipment and Method: Stylet Placement Confirmation: ETT inserted through vocal cords under direct vision, positive ETCO2 and breath sounds checked- equal and bilateral Secured at: 21 cm Tube secured with: Tape Dental Injury: Teeth and Oropharynx as per pre-operative assessment

## 2021-07-16 NOTE — Interval H&P Note (Signed)
History and Physical Interval Note:  07/16/2021 12:02 PM  Robert Le  has presented today for surgery, with the diagnosis of cholecystitis.  The various methods of treatment have been discussed with the patient and family. After consideration of risks, benefits and other options for treatment, the patient has consented to  Procedure(s): XI ROBOTIC ASSISTED LAPAROSCOPIC CHOLECYSTECTOMY (N/A) [name] (ICG) (N/A) as a surgical intervention.  The patient's history has been reviewed, patient examined, no change in status, stable for surgery.  I have reviewed the patient's chart and labs.  Questions were answered to the patient's satisfaction.     [name]

## 2021-07-16 NOTE — Transfer of Care (Signed)
Immediate Anesthesia Transfer of Care Note  Patient: Robert Le  Procedure(s) Performed: XI ROBOTIC ASSISTED LAPAROSCOPIC CHOLECYSTECTOMY (Abdomen) INDOCYANINE GREEN FLUORESCENCE IMAGING (ICG) (Abdomen)  Patient Location: PACU  Anesthesia Type:General  Level of Consciousness: drowsy  Airway & Oxygen Therapy: Patient Spontanous Breathing and Patient connected to face mask oxygen  Post-op Assessment: Report given to RN  Post vital signs: stable  Last Vitals:  Vitals Value Taken Time  BP 160/96 07/16/21 1609  Temp    Pulse 69 07/16/21 1611  Resp 12 07/16/21 1611  SpO2 100 % 07/16/21 1611  Vitals shown include unvalidated device data.  Last Pain:  Vitals:   07/16/21 1130  TempSrc: Temporal  PainSc: 0-No pain         Complications: No notable events documented.

## 2021-07-17 NOTE — Anesthesia Postprocedure Evaluation (Signed)
Anesthesia Post Note  Patient: Robert Le  Procedure(s) Performed: XI ROBOTIC ASSISTED LAPAROSCOPIC CHOLECYSTECTOMY (Abdomen) INDOCYANINE GREEN FLUORESCENCE IMAGING (ICG) (Abdomen)  Patient location during evaluation: PACU Anesthesia Type: General Level of consciousness: awake and alert Pain management: pain level controlled Vital Signs Assessment: post-procedure vital signs reviewed and stable Respiratory status: spontaneous breathing, nonlabored ventilation, respiratory function stable and patient connected to nasal cannula oxygen Cardiovascular status: blood pressure returned to baseline and stable Postop Assessment: no apparent nausea or vomiting Anesthetic complications: no   No notable events documented.   Last Vitals:  Vitals:   07/16/21 1715 07/16/21 1730  BP: 133/78 133/82  Pulse: 77 63  Resp: 17 17  Temp:  (!) 36.2 C  SpO2: 93% 92%    Last Pain:  Vitals:   07/16/21 1630  TempSrc:   PainSc: Asleep                 Martha Clan

## 2021-07-19 DIAGNOSIS — I82409 Acute embolism and thrombosis of unspecified deep veins of unspecified lower extremity: Secondary | ICD-10-CM | POA: Diagnosis not present

## 2021-07-19 DIAGNOSIS — K811 Chronic cholecystitis: Secondary | ICD-10-CM | POA: Diagnosis not present

## 2021-07-20 LAB — SURGICAL PATHOLOGY

## 2021-07-22 ENCOUNTER — Encounter: Payer: Self-pay | Admitting: Physician Assistant

## 2021-07-22 ENCOUNTER — Other Ambulatory Visit: Payer: Medicare Other

## 2021-07-22 ENCOUNTER — Ambulatory Visit (INDEPENDENT_AMBULATORY_CARE_PROVIDER_SITE_OTHER): Payer: Medicare Other | Admitting: Physician Assistant

## 2021-07-22 VITALS — BP 150/100 | HR 82 | Temp 98.4°F

## 2021-07-22 DIAGNOSIS — K810 Acute cholecystitis: Secondary | ICD-10-CM

## 2021-07-22 DIAGNOSIS — Z09 Encounter for follow-up examination after completed treatment for conditions other than malignant neoplasm: Secondary | ICD-10-CM

## 2021-07-22 NOTE — Patient Instructions (Addendum)
Change dressing over drain wound once daily until healed.  Follow-up with our office as needed.  Please call and ask to speak with a nurse if you develop questions or concerns.   GENERAL POST-OPERATIVE PATIENT INSTRUCTIONS   WOUND CARE INSTRUCTIONS:  Keep a dry clean dressing on the wound if there is drainage. The initial bandage may be removed after 24 hours.  Once the wound has quit draining you may leave it open to air.  If clothing rubs against the wound or causes irritation and the wound is not draining you may cover it with a dry dressing during the daytime.  Try to keep the wound dry and avoid ointments on the wound unless directed to do so.  If the wound becomes bright red and painful or starts to drain infected material that is not clear, please contact your physician immediately.  If the wound is mildly pink and has a thick firm ridge underneath it, this is normal, and is referred to as a healing ridge.  This will resolve over the next 4-6 weeks.  BATHING: You may shower if you have been informed of this by your surgeon. However, Please do not submerge in a tub, hot tub, or pool until incisions are completely sealed or have been told by your surgeon that you may do so.  DIET:  You may eat any foods that you can tolerate.  It is a good idea to eat a high fiber diet and take in plenty of fluids to prevent constipation.  If you do become constipated you may want to take a mild laxative or take ducolax tablets on a daily basis until your bowel habits are regular.  Constipation can be very uncomfortable, along with straining, after recent surgery.  ACTIVITY:  You are encouraged to cough and deep breath or use your incentive spirometer if you were given one, every 15-30 minutes when awake.  This will help prevent respiratory complications and low grade fevers post-operatively if you had a general anesthetic.  You may want to hug a pillow when coughing and sneezing to add additional support to the  surgical area, if you had abdominal or chest surgery, which will decrease pain during these times.  You are encouraged to walk and engage in light activity for the next two weeks.  You should not lift more than 20 pounds for 6 weeks total after surgery as it could put you at increased risk for complications.  Twenty pounds is roughly equivalent to a plastic bag of groceries. At that time- Listen to your body when lifting, if you have pain when lifting, stop and then try again in a few days. Soreness after doing exercises or activities of daily living is normal as you get back in to your normal routine.  MEDICATIONS:  Try to take narcotic medications and anti-inflammatory medications, such as tylenol, ibuprofen, naprosyn, etc., with food.  This will minimize stomach upset from the medication.  Should you develop nausea and vomiting from the pain medication, or develop a rash, please discontinue the medication and contact your physician.  You should not drive, make important decisions, or operate machinery when taking narcotic pain medication.  SUNBLOCK Use sun block to incision area over the next year if this area will be exposed to sun. This helps decrease scarring and will allow you avoid a permanent darkened area over your incision.  QUESTIONS:  Please feel free to call our office if you have any questions, and we will be glad to  assist you. [phone]

## 2021-07-30 ENCOUNTER — Other Ambulatory Visit: Payer: Self-pay | Admitting: Oncology

## 2021-08-25 DIAGNOSIS — Z741 Need for assistance with personal care: Secondary | ICD-10-CM | POA: Insufficient documentation

## 2021-08-25 DIAGNOSIS — R4189 Other symptoms and signs involving cognitive functions and awareness: Secondary | ICD-10-CM | POA: Insufficient documentation

## 2021-08-27 DIAGNOSIS — D485 Neoplasm of uncertain behavior of skin: Secondary | ICD-10-CM | POA: Diagnosis not present

## 2021-09-09 DIAGNOSIS — F39 Unspecified mood [affective] disorder: Secondary | ICD-10-CM | POA: Diagnosis not present

## 2021-09-09 DIAGNOSIS — M5416 Radiculopathy, lumbar region: Secondary | ICD-10-CM | POA: Diagnosis not present

## 2021-09-09 DIAGNOSIS — G301 Alzheimer's disease with late onset: Secondary | ICD-10-CM | POA: Diagnosis not present

## 2021-10-18 ENCOUNTER — Telehealth: Payer: Self-pay | Admitting: Student

## 2021-10-18 DIAGNOSIS — F0280 Dementia in other diseases classified elsewhere without behavioral disturbance: Secondary | ICD-10-CM

## 2021-10-18 NOTE — Telephone Encounter (Signed)
Called Son, Merry Proud to confirm patient's change in Code Status. Received e-mail that has HCPOA they would like to change code status to DNAR. Updated status in Epic and DNR order at his memory care unit.   Tomasa Rand, MD, [facility] [phone]

## 2021-11-12 ENCOUNTER — Encounter: Payer: Self-pay | Admitting: Student

## 2021-11-12 ENCOUNTER — Non-Acute Institutional Stay (SKILLED_NURSING_FACILITY): Payer: Medicare Other | Admitting: Student

## 2021-11-12 DIAGNOSIS — Z8679 Personal history of other diseases of the circulatory system: Secondary | ICD-10-CM

## 2021-11-12 DIAGNOSIS — K810 Acute cholecystitis: Secondary | ICD-10-CM

## 2021-11-12 DIAGNOSIS — G4700 Insomnia, unspecified: Secondary | ICD-10-CM | POA: Diagnosis not present

## 2021-11-12 DIAGNOSIS — F0280 Dementia in other diseases classified elsewhere without behavioral disturbance: Secondary | ICD-10-CM

## 2021-11-12 DIAGNOSIS — G309 Alzheimer's disease, unspecified: Secondary | ICD-10-CM | POA: Diagnosis not present

## 2021-11-12 NOTE — Progress Notes (Signed)
Location:  [facility] Room Number: [facility] 106P Place of Service:  ALF [phone]) Provider:  Dr. Amada Kingfisher, MD  Patient Care Team: [name], MD as PCP - General [facility])  Extended Emergency Contact Information Primary Emergency Contact: Robert Le of [place] Mobile Phone: [phone] Relation: Son Secondary Emergency Contact: Robert Le [place] of [place] Mobile Phone: [phone] Relation: Son  Code Status: Full Code Goals of care: Advanced Directive information    07/16/2021   11:30 AM  Advanced Directives  Does Patient Have a Medical Advance Directive? No  Would patient like information on creating a medical advance directive? No - Patient declined     Chief Complaint  Patient presents with   Medical Management of Chronic Issues    GOC    HPI:  Pt is a 81 y.o. male seen today for medical management of chronic diseases.  Patient states his name is Robert Le. Unable to give date of birth. Season. Son's name. Daughter's name.   Advance Care Planning  Conversation with Robert Le [facility]) and daughter concern for patient's progression of dementia. Patient has had significant decline in the last 8 months. His decline began during the pandemic in 2020, and he hasn't regained memory. He keeps declining in function. Not able to feed himself, unable to ambulate on his own, incontinent of urine. Family confirmed he will maintain DNR order. Concern for lack of full family involvement in MOST form completion so will defer at this time. Discussed levels of care, IVF, abx, and feeding tube, however, patient's children did not offer a decision at this time.       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   amLODipine (NORVASC) 5 MG tablet Take 5 mg by mouth daily.   carboxymethylcellulose (ARTIFICIAL TEARS) 1 % ophthalmic solution Apply 1 drop to eye 2 (two) times daily as needed.   Cholecalciferol (VITAMIN D3) 1.25 MG (50000 UT) CAPS Take 1 capsule by mouth every morning.   donepezil (ARICEPT) 5 MG tablet Take 5 mg by mouth every evening.   DULoxetine (CYMBALTA) 30 MG capsule Give Two capsules by mouth in the morning; Give One capsule by  mouth in the evening daily at 4pm   HYDROcodone-acetaminophen (NORCO/VICODIN) 5-325 MG tablet Take 1 tablet by mouth every 4 (four) hours as needed for moderate pain.   memantine (NAMENDA) 10 MG tablet Take 10 mg by mouth 2 (two) times daily.   Multiple Vitamin (THERA-TABS PO) Take 1 tablet by mouth daily at 6 (six) AM.   OXYGEN Inhale 2 L into the lungs as needed (to keep O2 sats > 90%).   Polyethylene Glycol 3350 (PEG 3350) 17 g PACK Take 1 packet by mouth every other day.   senna-docusate (SENNA-S) 8.6-50 MG tablet Take 1 tablet by mouth 2 (two)  times daily.   apixaban (ELIQUIS) 5 MG TABS tablet Take 1 tablet (5 mg total) by mouth 2 (two) times daily.   [DISCONTINUED] acetaminophen (TYLENOL) 500 MG tablet Take 2 tablets (1,000 mg total) by mouth every 6 (six) hours as needed for mild pain.   [DISCONTINUED] bisacodyl (DULCOLAX) 10 MG suppository Place 10 mg rectally as needed for moderate constipation.   [DISCONTINUED] buPROPion (WELLBUTRIN) 75 MG tablet Take 150 mg by mouth 2 (two) times daily.   [DISCONTINUED] gabapentin (NEURONTIN) 100 MG capsule Take 200 mg by mouth 2 (two) times daily.   [DISCONTINUED] gabapentin (NEURONTIN) 600 MG tablet Take 600 mg by mouth every evening.   [DISCONTINUED] magnesium hydroxide ([employer] MILK OF MAGNESIA) 400 MG/5ML suspension Take 30 mLs by mouth daily as needed for mild constipation.   [DISCONTINUED] oxyCODONE (OXY IR/ROXICODONE) 5 MG immediate release tablet Take 1 tablet (5 mg total) by mouth every 4 (four) hours as needed for severe pain.   No facility-administered encounter medications on file as of 11/12/2021.    Review of Systems  Unable to perform ROS: Dementia    Immunization History  Administered Date(s) Administered   Influenza, Seasonal, Injecte, Preservative Fre 11/01/2014, 11/26/2015   Influenza,inj,Quad PF,6+ Mos 11/14/2016   Influenza-Unspecified 11/16/2020   Moderna Covid-19 Vaccine Bivalent Booster 69yr & up 10/22/2020   Moderna Sars-Covid-2 Vaccination 02/10/2019, 03/10/2019   Pneumococcal Conjugate-13 01/02/2014   Pneumococcal Polysaccharide-23 09/02/2003   Tdap 06/08/2011   Pertinent  Health Maintenance Due  Topic Date Due   INFLUENZA VACCINE  08/31/2021      06/16/2021    9:40 AM 06/16/2021   10:00 PM 06/17/2021    8:40 AM 06/17/2021    9:00 PM 07/16/2021   11:16 AM  Fall Risk  Patient Fall Risk Level High fall risk High fall risk High fall risk High fall risk High fall risk   Functional Status Survey:    Vitals:   11/12/21 1620  BP: (!) 149/93  Pulse: 78   Resp: 18  Temp: 97.8 F (36.6 C)  SpO2: 94%  Weight: 168 lb 6.4 oz (76.4 kg)  Height: '5\' 7"'$  (1.702 m)   Body mass index is 26.38 kg/m. Physical Exam Constitutional:      Comments: Sitting in wheelchair   Cardiovascular:     Rate and Rhythm: Normal rate.     Pulses: Normal pulses.  Pulmonary:     Effort: Pulmonary effort is normal.     Breath sounds: Normal breath sounds.  Abdominal:     General: Bowel sounds are normal.     Palpations: Abdomen is soft.  Skin:    General: Skin is warm.  Neurological:     Mental Status: He is alert. Mental status is at baseline. He is disoriented.     Labs reviewed: Recent Labs    04/18/21 0401 04/19/21 0612 04/20/21 0522  06/14/21 1828 06/15/21 1054 06/16/21 0557 06/17/21 0738  NA 139 142 142   < > 139 136 136  K 3.8 3.7 3.2*   < > 4.3 3.9 3.6  CL 108 108 107   < > 104 101 100  CO2 '23 28 27   '$ < > '23 26 27  '$ GLUCOSE 114* 102* 109*   < > 90 89 97  BUN '21 23 20   '$ < > '14 13 9  '$ CREATININE 1.07 0.96 1.08   < > 0.99 0.88 0.96  CALCIUM 8.8* 8.6* 8.5*   < > 8.9 8.7* 8.8*  MG 2.1 2.4 2.1  --   --   --   --    < > = values in this interval not displayed.   Recent Labs    06/15/21 1054 06/16/21 0557 06/17/21 0738  AST 73* 45* 31  ALT 106* 76* 53*  ALKPHOS 83 75 72  BILITOT 0.8 0.5 0.6  PROT 5.4* 5.6* 6.0*  ALBUMIN 3.0* 3.1* 3.3*   Recent Labs    04/16/21 2210 04/17/21 0839 04/18/21 0401 04/19/21 0612 06/14/21 1828 06/15/21 1210 06/16/21 0557  WBC 20.6*   < > 14.7*   < > 15.9* 11.8* 7.0  NEUTROABS 17.7*  --  12.1*  --  14.6*  --   --   HGB 15.5   < > 13.8   < > 15.5 13.0 13.6  HCT 46.9   < > 42.2   < > 48.0 40.2 41.7  MCV 90.5   < > 90.4   < > 91.6 92.0 91.6  PLT 232   < > 219   < > 196 171 165   < > = values in this interval not displayed.   No results found for: "TSH" No results found for: "HGBA1C" No results found for: "CHOL", "HDL", "LDLCALC", "LDLDIRECT", "TRIG", "CHOLHDL"  Significant Diagnostic Results in  last 30 days:  No results found.  Assessment/Plan 1. Alzheimer's dementia without behavioral disturbance [facility]) Discussed with daughter and son concerned that patient's dementia has progressed to the point of requiring total care.  Nursing patient has had numerous attempts of occupational therapy working with him and have been unable to have significant improvement or interactions due to his inability to follow directions.  Patient was unable to sit up in his chair to listen to his lungs today because he could not follow directions.  Educated daughter and son on the level of dementia that patient has.  Discussed concern for future pneumonia and urinary tract infections in the setting of progressing dementia.  At this point would state that he has reached a fast score of 7 given the fact that he is incontinent of urine and bowel and is now requiring additional support for feeding.  Patient speaks in nonsensical words and sentences.  Offered copies of the MOST form for patient's children and they plan to continue conversation independently and return to me with additional information.  Continue donepezil and Namenda.  Continue duloxetine 60 mg and 30 mg nightly.  2. History of hypertension Blood pressure slightly elevated, however typically with diet and goal per chart review.  Continue amlodipine 5 mg  3. Acute cholecystitis Patient with history of a drain secondary to cholecystitis.  Discussed concern that if patient decompensates in the future he will not only have recurrent delirium as he due to the most recent hospitalization, but he may be severely ill and have to have pressors In ICU level of care if  they have not clearly defined their goals for treatment.  Patient has had chronic pain control with Norco 5-4.  4. Insomnia, unspecified type No medications at this time continue to monitor    Family/ staff Communication: Son, daughter, nursing.  Labs/tests ordered: None  Greater than 1 hour  was spent in chart review, face-to-face time, goals of care discussion.  Greater been 30 minutes was spent discussing goals of care for patient and educating regarding prognosis.  Tomasa Rand, MD, [facility] [phone]

## 2021-12-02 LAB — COMPREHENSIVE METABOLIC PANEL
Calcium: 9.1 (ref 8.7–10.7)
eGFR: 64

## 2021-12-02 LAB — BASIC METABOLIC PANEL
BUN: 15 (ref 4–21)
CO2: 31 — AB (ref 13–22)
Chloride: 102 (ref 99–108)
Creatinine: 1.1 (ref 0.6–1.3)
Glucose: 88
Potassium: 4.2 mEq/L (ref 3.5–5.1)
Sodium: 139 (ref 137–147)

## 2021-12-03 ENCOUNTER — Encounter: Payer: Self-pay | Admitting: Student

## 2021-12-16 ENCOUNTER — Non-Acute Institutional Stay: Payer: Medicare Other | Admitting: Nurse Practitioner

## 2021-12-16 ENCOUNTER — Encounter: Payer: Self-pay | Admitting: Nurse Practitioner

## 2021-12-16 DIAGNOSIS — F0280 Dementia in other diseases classified elsewhere without behavioral disturbance: Secondary | ICD-10-CM

## 2021-12-16 DIAGNOSIS — Z86718 Personal history of other venous thrombosis and embolism: Secondary | ICD-10-CM

## 2021-12-16 DIAGNOSIS — G309 Alzheimer's disease, unspecified: Secondary | ICD-10-CM | POA: Diagnosis not present

## 2021-12-16 DIAGNOSIS — F339 Major depressive disorder, recurrent, unspecified: Secondary | ICD-10-CM

## 2021-12-16 DIAGNOSIS — E559 Vitamin D deficiency, unspecified: Secondary | ICD-10-CM

## 2021-12-16 DIAGNOSIS — I10 Essential (primary) hypertension: Secondary | ICD-10-CM

## 2021-12-16 NOTE — Progress Notes (Signed)
Location:   [facility] Room Number: 106 Place of Service:  ALF [phone]) Provider:  Sherrie Mustache, NP  [name], MD  Patient Care Team: [name], MD as PCP - General (Family Medicine)  Extended Emergency Contact Information Primary Emergency Contact: [name] of [place] Phone: [phone] Relation: Son Secondary Emergency Contact: [name] [place] of [place] Mobile Phone: [phone] Relation: Son  Code Status:  DNR Goals of care: Advanced Directive information    12/16/2021    9:05 AM  Advanced Directives  Does Patient Have a Medical Advance Directive? Yes  Type of Advance Directive [place]  Does patient want to make changes to medical advance directive? No - Patient declined  Copy of [place] in Chart? Yes - validated most recent copy scanned in chart (See row information)     Chief Complaint  Patient presents with   Medical Management of Chronic Issues    Routine follow up    HPI:  Pt is a 81 y.o. male seen today for medical management of chronic diseases.  Pt with hx of dementia at [facility]. Pt has had significant decline since his hospitalization due to acute cholecystitis.  Pt is now total care and communicates minimally.  Nursing without any concerns at this time.       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   TOTAL HIP ARTHROPLASTY Left 08/20/2013   Procedure: LEFT TOTAL HIP ARTHROPLASTY ANTERIOR APPROACH;  Surgeon: Mauri Pole, MD;  Location: [facility];  Service: Orthopedics;  Laterality: Left;    Allergies  Allergen Reactions   Donepezil Other (See Comments)    Increased confusion Still listed on pt's current med list as of 06/14/2021   Sulfites     Unable to recall reaction or specific sulfa drug    Allergies as of 12/16/2021       Reactions   Donepezil Other (See Comments)   Increased confusion Still listed on pt's current med list as of 06/14/2021   Sulfites    Unable to recall reaction or specific sulfa drug        Medication List        Accurate as of December 16, 2021  9:08 AM. If you have any questions, ask your nurse or doctor.          acetaminophen 325 MG tablet Commonly known as: TYLENOL Take 650 mg by mouth every 6 (six) hours as needed.   amLODipine 5 MG tablet Commonly known as: NORVASC Take 5 mg by mouth daily.   apixaban 5 MG Tabs tablet Commonly known as: ELIQUIS Take 1 tablet (5 mg total) by mouth 2 (two) times daily.   Artificial Tears 1 % ophthalmic solution Generic drug: carboxymethylcellulose Apply 1  drop to eye 2 (two) times daily as needed.   donepezil 5 MG tablet Commonly known as: ARICEPT Take 5 mg by mouth every evening.   DULoxetine 30 MG capsule Commonly known as: CYMBALTA Give Two capsules by mouth in the morning; Give One capsule by mouth in the evening daily at 4pm   HYDROcodone-acetaminophen 5-325 MG tablet Commonly known as: NORCO/VICODIN Take 1 tablet by mouth every 4 (four) hours as needed for moderate pain.   memantine 10 MG tablet Commonly known as: NAMENDA Take 10 mg by mouth 2 (two) times daily.   OXYGEN Inhale 2 L into the lungs as needed (to keep O2 sats > 90%).   PEG 3350 17 g Pack Take 1 packet by mouth every other day.   Senna-S 8.6-50 MG tablet Generic drug:  senna-docusate Take 1 tablet by mouth 2 (two) times daily.   THERA-TABS PO Take 1 tablet by mouth daily at 6 (six) AM.   Vitamin D3 1.25 MG (50000 UT) Caps Take 1 capsule by mouth every morning.        Review of Systems  Unable to perform ROS: Dementia    Immunization History  Administered Date(s) Administered   Influenza, Seasonal, Injecte, Preservative Fre 11/01/2014, 11/26/2015   Influenza,inj,Quad PF,6+ Mos 11/14/2016   Influenza-Unspecified 11/16/2020, 11/17/2021   Moderna Covid-19 Vaccine Bivalent Booster 22yr & up 10/22/2020   Moderna Sars-Covid-2 Vaccination 02/10/2019, 03/10/2019   Pneumococcal Conjugate-13 01/02/2014   Pneumococcal Polysaccharide-23 09/02/2003   Tdap 06/08/2011   Unspecified SARS-COV-2 Vaccination 12/11/2019   Pertinent  Health Maintenance Due  Topic Date Due   INFLUENZA VACCINE  Completed      06/16/2021    9:40 AM 06/16/2021   10:00 PM 06/17/2021    8:40 AM 06/17/2021    9:00 PM 07/16/2021   11:16 AM  Fall Risk  Patient Fall Risk Level High fall risk High fall risk High fall risk High fall risk High fall risk   Functional Status Survey:    Vitals:   12/16/21 0854  BP: (!) 142/86  Pulse: 87  Resp: 17  Temp: (!) 97 F (36.1 C)  SpO2: 94%  Weight: 172 lb 12.8 oz (78.4 kg)  Height: '5\' 7"'$  (1.702 m)   Body mass index is 27.06 kg/m. Physical Exam Constitutional:      General: He is not in acute distress.    Appearance: He is well-developed. He is not diaphoretic.  HENT:     Head: Normocephalic and atraumatic.     Right Ear: External ear normal.     Left Ear: External ear normal.     Mouth/Throat:     Pharynx: No oropharyngeal exudate.  Eyes:     Conjunctiva/sclera: Conjunctivae normal.     Pupils: Pupils are equal, round, and reactive to light.  Cardiovascular:     Rate and Rhythm: Normal rate and regular rhythm.     Heart sounds: Normal heart sounds.  Pulmonary:     Effort: Pulmonary effort is normal.     Breath sounds:  Normal breath sounds.  Abdominal:     General: Bowel sounds are normal.     Palpations: Abdomen is soft.  Musculoskeletal:        General: No tenderness.     Cervical back: Normal range of motion and neck supple.     Right lower leg: No edema.     Left lower leg: No edema.  Skin:    General: Skin is warm and dry.  Neurological:  Mental Status: He is alert.     Motor: Weakness present.     Gait: Gait abnormal.  Psychiatric:        Cognition and Memory: Cognition is impaired. Memory is impaired. He exhibits impaired recent memory.     Labs reviewed: Recent Labs    04/18/21 0401 04/19/21 0612 04/20/21 0522 06/14/21 1828 06/15/21 1054 06/16/21 0557 06/17/21 0738 12/02/21 0000  NA 139 142 142   < > 139 136 136 139  K 3.8 3.7 3.2*   < > 4.3 3.9 3.6 4.2  CL 108 108 107   < > 104 101 100 102  CO2 '23 28 27   '$ < > '23 26 27 '$ 31*  GLUCOSE 114* 102* 109*   < > 90 89 97  --   BUN '21 23 20   '$ < > '14 13 9 15  '$ CREATININE 1.07 0.96 1.08   < > 0.99 0.88 0.96 1.1  CALCIUM 8.8* 8.6* 8.5*   < > 8.9 8.7* 8.8* 9.1  MG 2.1 2.4 2.1  --   --   --   --   --    < > = values in this interval not displayed.   Recent Labs    06/15/21 1054 06/16/21 0557 06/17/21 0738  AST 73* 45* 31  ALT 106* 76* 53*  ALKPHOS 83 75 72  BILITOT 0.8 0.5 0.6  PROT 5.4* 5.6* 6.0*  ALBUMIN 3.0* 3.1* 3.3*   Recent Labs    04/16/21 2210 04/17/21 0839 04/18/21 0401 04/19/21 0612 06/14/21 1828 06/15/21 1210 06/16/21 0557  WBC 20.6*   < > 14.7*   < > 15.9* 11.8* 7.0  NEUTROABS 17.7*  --  12.1*  --  14.6*  --   --   HGB 15.5   < > 13.8   < > 15.5 13.0 13.6  HCT 46.9   < > 42.2   < > 48.0 40.2 41.7  MCV 90.5   < > 90.4   < > 91.6 92.0 91.6  PLT 232   < > 219   < > 196 171 165   < > = values in this interval not displayed.   No results found for: "TSH" No results found for: "HGBA1C" No results found for: "CHOL", "HDL", "LDLCALC", "LDLDIRECT", "TRIG", "CHOLHDL"  Significant Diagnostic Results in last 30  days:  No results found.  Assessment/Plan 1. Alzheimer's dementia without behavioral disturbance ([place]) -advanced, likely not receiving benefit from medication as he is total care. Will dc aricept at this time  2. Vitamin D deficiency -continues supplement.   3. Hypertension, unspecified type -Blood pressure controlled, Continue current medications and dietary modifications follow metabolic panel  4. History of DVT (deep vein thrombosis) -continues on eliquis, will follow up cbc.   5. Recurrent major depressive disorder, remission status unspecified (HCC) -overall stable, will attempt gdr and decrease cymbalta to 60 mg daily    Ayelet Gruenewald K. Hamel, [facility] [phone]

## 2021-12-18 ENCOUNTER — Inpatient Hospital Stay
Admission: EM | Admit: 2021-12-18 | Discharge: 2021-12-22 | DRG: 871 | Disposition: A | Discharge status: Skilled Nursing Facility | Payer: Medicare Other | Source: Skilled Nursing Facility | Attending: Internal Medicine | Admitting: Internal Medicine

## 2021-12-18 ENCOUNTER — Emergency Department: Payer: Medicare Other

## 2021-12-18 ENCOUNTER — Other Ambulatory Visit: Payer: Self-pay

## 2021-12-18 DIAGNOSIS — R1311 Dysphagia, oral phase: Secondary | ICD-10-CM | POA: Diagnosis present

## 2021-12-18 DIAGNOSIS — Z8249 Family history of ischemic heart disease and other diseases of the circulatory system: Secondary | ICD-10-CM

## 2021-12-18 DIAGNOSIS — Z8701 Personal history of pneumonia (recurrent): Secondary | ICD-10-CM

## 2021-12-18 DIAGNOSIS — I700 Atherosclerosis of aorta: Secondary | ICD-10-CM | POA: Diagnosis present

## 2021-12-18 DIAGNOSIS — G9341 Metabolic encephalopathy: Secondary | ICD-10-CM | POA: Diagnosis present

## 2021-12-18 DIAGNOSIS — Z8261 Family history of arthritis: Secondary | ICD-10-CM

## 2021-12-18 DIAGNOSIS — I129 Hypertensive chronic kidney disease with stage 1 through stage 4 chronic kidney disease, or unspecified chronic kidney disease: Secondary | ICD-10-CM | POA: Diagnosis present

## 2021-12-18 DIAGNOSIS — F03C0 Unspecified dementia, severe, without behavioral disturbance, psychotic disturbance, mood disturbance, and anxiety: Secondary | ICD-10-CM | POA: Diagnosis present

## 2021-12-18 DIAGNOSIS — F0280 Dementia in other diseases classified elsewhere without behavioral disturbance: Secondary | ICD-10-CM | POA: Diagnosis present

## 2021-12-18 DIAGNOSIS — Z993 Dependence on wheelchair: Secondary | ICD-10-CM

## 2021-12-18 DIAGNOSIS — Z96642 Presence of left artificial hip joint: Secondary | ICD-10-CM | POA: Diagnosis present

## 2021-12-18 DIAGNOSIS — E872 Acidosis, unspecified: Secondary | ICD-10-CM | POA: Diagnosis present

## 2021-12-18 DIAGNOSIS — Z888 Allergy status to other drugs, medicaments and biological substances status: Secondary | ICD-10-CM

## 2021-12-18 DIAGNOSIS — Z20822 Contact with and (suspected) exposure to covid-19: Secondary | ICD-10-CM | POA: Diagnosis present

## 2021-12-18 DIAGNOSIS — Z86718 Personal history of other venous thrombosis and embolism: Secondary | ICD-10-CM

## 2021-12-18 DIAGNOSIS — J189 Pneumonia, unspecified organism: Secondary | ICD-10-CM

## 2021-12-18 DIAGNOSIS — R627 Adult failure to thrive: Secondary | ICD-10-CM | POA: Diagnosis present

## 2021-12-18 DIAGNOSIS — R011 Cardiac murmur, unspecified: Secondary | ICD-10-CM | POA: Diagnosis present

## 2021-12-18 DIAGNOSIS — Z515 Encounter for palliative care: Secondary | ICD-10-CM

## 2021-12-18 DIAGNOSIS — Z66 Do not resuscitate: Secondary | ICD-10-CM | POA: Diagnosis present

## 2021-12-18 DIAGNOSIS — E876 Hypokalemia: Secondary | ICD-10-CM | POA: Diagnosis not present

## 2021-12-18 DIAGNOSIS — M1990 Unspecified osteoarthritis, unspecified site: Secondary | ICD-10-CM | POA: Diagnosis present

## 2021-12-18 DIAGNOSIS — Z8719 Personal history of other diseases of the digestive system: Secondary | ICD-10-CM

## 2021-12-18 DIAGNOSIS — R6520 Severe sepsis without septic shock: Secondary | ICD-10-CM | POA: Diagnosis present

## 2021-12-18 DIAGNOSIS — Z882 Allergy status to sulfonamides status: Secondary | ICD-10-CM

## 2021-12-18 DIAGNOSIS — A419 Sepsis, unspecified organism: Principal | ICD-10-CM | POA: Diagnosis present

## 2021-12-18 DIAGNOSIS — I10 Essential (primary) hypertension: Secondary | ICD-10-CM | POA: Insufficient documentation

## 2021-12-18 DIAGNOSIS — Z905 Acquired absence of kidney: Secondary | ICD-10-CM

## 2021-12-18 DIAGNOSIS — Z87448 Personal history of other diseases of urinary system: Secondary | ICD-10-CM

## 2021-12-18 DIAGNOSIS — R6521 Severe sepsis with septic shock: Secondary | ICD-10-CM

## 2021-12-18 DIAGNOSIS — R4182 Altered mental status, unspecified: Principal | ICD-10-CM

## 2021-12-18 DIAGNOSIS — G309 Alzheimer's disease, unspecified: Secondary | ICD-10-CM | POA: Diagnosis present

## 2021-12-18 DIAGNOSIS — Z79899 Other long term (current) drug therapy: Secondary | ICD-10-CM

## 2021-12-18 DIAGNOSIS — Z7901 Long term (current) use of anticoagulants: Secondary | ICD-10-CM

## 2021-12-18 DIAGNOSIS — F0284 Dementia in other diseases classified elsewhere, unspecified severity, with anxiety: Secondary | ICD-10-CM | POA: Diagnosis present

## 2021-12-18 LAB — CBC WITH DIFFERENTIAL/PLATELET
Abs Immature Granulocytes: 0.12 10*3/uL — ABNORMAL HIGH (ref 0.00–0.07)
Basophils Absolute: 0 10*3/uL (ref 0.0–0.1)
Basophils Relative: 0 %
Eosinophils Absolute: 0 10*3/uL (ref 0.0–0.5)
Eosinophils Relative: 0 %
HCT: 39.9 % (ref 39.0–52.0)
Hemoglobin: 13.5 g/dL (ref 13.0–17.0)
Immature Granulocytes: 1 %
Lymphocytes Relative: 4 %
Lymphs Abs: 0.7 10*3/uL (ref 0.7–4.0)
MCH: 28.9 pg (ref 26.0–34.0)
MCHC: 33.8 g/dL (ref 30.0–36.0)
MCV: 85.4 fL (ref 80.0–100.0)
Monocytes Absolute: 0.8 10*3/uL (ref 0.1–1.0)
Monocytes Relative: 5 %
Neutro Abs: 15.3 10*3/uL — ABNORMAL HIGH (ref 1.7–7.7)
Neutrophils Relative %: 90 %
Platelets: 277 10*3/uL (ref 150–400)
RBC: 4.67 MIL/uL (ref 4.22–5.81)
RDW: 14.3 % (ref 11.5–15.5)
WBC: 17 10*3/uL — ABNORMAL HIGH (ref 4.0–10.5)
nRBC: 0 % (ref 0.0–0.2)

## 2021-12-18 LAB — RESP PANEL BY RT-PCR (FLU A&B, COVID) ARPGX2
Influenza A by PCR: NEGATIVE
Influenza B by PCR: NEGATIVE
SARS Coronavirus 2 by RT PCR: NEGATIVE

## 2021-12-18 LAB — COMPREHENSIVE METABOLIC PANEL
ALT: 17 U/L (ref 0–44)
AST: 25 U/L (ref 15–41)
Albumin: 3.1 g/dL — ABNORMAL LOW (ref 3.5–5.0)
Alkaline Phosphatase: 76 U/L (ref 38–126)
Anion gap: 8 (ref 5–15)
BUN: 21 mg/dL (ref 8–23)
CO2: 23 mmol/L (ref 22–32)
Calcium: 8.1 mg/dL — ABNORMAL LOW (ref 8.9–10.3)
Chloride: 106 mmol/L (ref 98–111)
Creatinine, Ser: 1.15 mg/dL (ref 0.61–1.24)
GFR, Estimated: >60 mL/min (ref >60)
Glucose, Bld: 131 mg/dL — ABNORMAL HIGH (ref 70–99)
Potassium: 3.5 mmol/L (ref 3.5–5.1)
Sodium: 137 mmol/L (ref 135–145)
Total Bilirubin: 0.8 mg/dL (ref 0.3–1.2)
Total Protein: 6.2 g/dL — ABNORMAL LOW (ref 6.5–8.1)

## 2021-12-18 LAB — URINALYSIS, COMPLETE (UACMP) WITH MICROSCOPIC
Bacteria, UA: NONE SEEN
Bilirubin Urine: NEGATIVE
Glucose, UA: NEGATIVE mg/dL
Hgb urine dipstick: NEGATIVE
Ketones, ur: NEGATIVE mg/dL
Leukocytes,Ua: NEGATIVE
Nitrite: NEGATIVE
Protein, ur: NEGATIVE mg/dL
Specific Gravity, Urine: 1.025 (ref 1.005–1.030)
Squamous Epithelial / HPF: NONE SEEN (ref 0–5)
pH: 5 (ref 5.0–8.0)

## 2021-12-18 LAB — PROTIME-INR
INR: 1.2 (ref 0.8–1.2)
Prothrombin Time: 14.9 seconds (ref 11.4–15.2)

## 2021-12-18 LAB — TROPONIN I (HIGH SENSITIVITY)
Troponin I (High Sensitivity): 15 ng/L (ref <18)
Troponin I (High Sensitivity): 15 ng/L (ref <18)

## 2021-12-18 LAB — BLOOD GAS, VENOUS
Acid-base deficit: 0.4 mmol/L (ref 0.0–2.0)
Bicarbonate: 25.4 mmol/L (ref 20.0–28.0)
O2 Saturation: 61.4 %
Patient temperature: 37
pCO2, Ven: 45 mmHg (ref 44–60)
pH, Ven: 7.36 (ref 7.25–7.43)
pO2, Ven: 36 mmHg (ref 32–45)

## 2021-12-18 LAB — BRAIN NATRIURETIC PEPTIDE: B Natriuretic Peptide: 74.2 pg/mL (ref 0.0–100.0)

## 2021-12-18 LAB — LACTIC ACID, PLASMA
Lactic Acid, Venous: 1.9 mmol/L (ref 0.5–1.9)
Lactic Acid, Venous: 3.4 mmol/L (ref 0.5–1.9)

## 2021-12-18 LAB — APTT: aPTT: 30 seconds (ref 24–36)

## 2021-12-18 MED ORDER — LACTATED RINGERS IV BOLUS (SEPSIS)
1000.0000 mL | Freq: Once | INTRAVENOUS | Status: AC
  Administered 2021-12-18: 1000 mL via INTRAVENOUS

## 2021-12-18 MED ORDER — SODIUM CHLORIDE 0.9 % IV SOLN
2.0000 g | INTRAVENOUS | Status: AC
  Administered 2021-12-18 – 2021-12-22 (×5): 2 g via INTRAVENOUS
  Filled 2021-12-18 (×2): qty 2
  Filled 2021-12-18 (×3): qty 20

## 2021-12-18 MED ORDER — LORAZEPAM 2 MG/ML IJ SOLN
1.0000 mg | Freq: Once | INTRAMUSCULAR | Status: DC
  Filled 2021-12-18: qty 1

## 2021-12-18 MED ORDER — LACTATED RINGERS IV SOLN: INTRAVENOUS | Status: AC

## 2021-12-18 MED ORDER — SODIUM CHLORIDE 0.9 % IV SOLN
500.0000 mg | INTRAVENOUS | Status: AC
  Administered 2021-12-18 – 2021-12-22 (×5): 500 mg via INTRAVENOUS
  Filled 2021-12-18 (×3): qty 500
  Filled 2021-12-18 (×2): qty 5

## 2021-12-18 NOTE — ED Notes (Signed)
Full rainbow sent to the lab.

## 2021-12-18 NOTE — ED Notes (Signed)
2lpm O2 added to pt due to desattig with apneic events

## 2021-12-18 NOTE — Assessment & Plan Note (Signed)
Sepsis Sepsis criteria includes fever, tachycardia, leukocytosis with lactic acidosis and altered mental status Continue sepsis fluids Rocephin vancomycin We will keep n.p.o. overnight until more alert

## 2021-12-18 NOTE — ED Provider Notes (Signed)
[facility] Provider Note    Event Date/Time   First MD Initiated Contact with Patient 12/18/21 1807     (approximate)   History   Altered mental status   HPI  Robert Le is a 81 y.o. male brought in by EMS.  Reportedly he was coughing at the nursing on for about a week and then became unresponsive.  When he coughs his lungs are very wet sounding.  Review of his old records show he has Alzheimer's disease cardiac murmur hypertension anxiety and chronic kidney disease.      Physical Exam   Triage Vital Signs: ED Triage Vitals [12/18/21 1814]  Enc Vitals Group     BP (!) 159/91     Pulse Rate (!) 110     Resp 20     Temp 99.8 F (37.7 C)     Temp Source Axillary     SpO2 95 %     Weight      Height      Head Circumference      Peak Flow      Pain Score      Pain Loc      Pain Edu?      Excl. in [place]?     Most recent vital signs: Vitals:   12/18/21 2300 12/18/21 2330  BP: 136/87 130/70  Pulse: 97 93  Resp: 18 20  Temp:    SpO2: 90% 96%     General: Unresponsive he will occasionally open his eyes look at you and say a word or 2 and go back to sleep again CV:  Good peripheral perfusion.  Heart regular rate and rhythm no audible murmurs Resp:  Normal effort.  Lungs with diffuse rhonchi Abd:  No distention.  Soft and nontender Extremities: No edema   ED Results / Procedures / Treatments   Labs (all labs ordered are listed, but only abnormal results are displayed) Labs Reviewed  LACTIC ACID, PLASMA - Abnormal; Notable for the following components:      Result Value   Lactic Acid, Venous 3.4 (*)    All other components within normal limits  COMPREHENSIVE METABOLIC PANEL - Abnormal; Notable for the following components:   Glucose, Bld 131 (*)    Calcium 8.1 (*)    Total Protein 6.2 (*)    Albumin 3.1 (*)    All other components within normal limits  CBC WITH DIFFERENTIAL/PLATELET - Abnormal; Notable for the following  components:   WBC 17.0 (*)    Neutro Abs 15.3 (*)    Abs Immature Granulocytes 0.12 (*)    All other components within normal limits  URINALYSIS, COMPLETE (UACMP) WITH MICROSCOPIC - Abnormal; Notable for the following components:   Color, Urine YELLOW (*)    APPearance HAZY (*)    All other components within normal limits  RESP PANEL BY RT-PCR (FLU A&B, COVID) ARPGX2  CULTURE, BLOOD (ROUTINE X 2)  CULTURE, BLOOD (ROUTINE X 2)  URINE CULTURE  LACTIC ACID, PLASMA  PROTIME-INR  APTT  BLOOD GAS, VENOUS  BRAIN NATRIURETIC PEPTIDE  TROPONIN I (HIGH SENSITIVITY)  TROPONIN I (HIGH SENSITIVITY)     EKG  EKG read interpreted by me shows sinus tachycardia rate of 108 left axis nonspecific ST-T wave changes   RADIOLOGY Chest x-ray read by radiology reviewed by me shows extremely low lung volumes.  I agree with this.  Radiology thinks there is vascular congestion to this could be a possibility. Head CT read  by radiology reviewed by me shows air-fluid levels in the maxillary and sphenoid sinus.  Radiologist also reads small amount of air in the right ophthalmic vein and right muscles of mastication likely related to IV catheter placement.  I can examine the patient I do not feel any crepitus anywhere. CT of the chest shows bilateral lower lobe areas concerning for pneumonia.  I reviewed the films and agree. PROCEDURES:  Critical Care performed: Critical care time 40 minutes.  This includes going and seeing the patient several times.  Additionally I went to see the patient with Dr. Damita Dunnings when she came to see the patient.  Procedures   MEDICATIONS ORDERED IN ED: Medications  lactated ringers infusion ( Intravenous New Bag/Given 12/18/21 2139)  cefTRIAXone (ROCEPHIN) 2 g in sodium chloride 0.9 % 100 mL IVPB (0 g Intravenous Stopped 12/18/21 2023)  azithromycin (ZITHROMAX) 500 mg in sodium chloride 0.9 % 250 mL IVPB (0 mg Intravenous Stopped 12/18/21 2037)  LORazepam (ATIVAN) injection 1  mg (has no administration in time range)  lactated ringers bolus 1,000 mL (0 mLs Intravenous Stopped 12/18/21 2130)    And  lactated ringers bolus 1,000 mL (0 mLs Intravenous Stopped 12/18/21 2130)    And  lactated ringers bolus 1,000 mL (0 mLs Intravenous Stopped 12/18/21 2026)     IMPRESSION / MDM / ASSESSMENT AND PLAN / ED COURSE  I reviewed the triage vital signs and the nursing notes.   Patient is altered.  Originally his neck did not seem to be stiff at all.  Later on there was some question about whether or not it was stiff.  Not quite sure about this.  The patient will wake up and talk to [place] at times but not at other times.  He makes repeated chewing movements with his mouth but when I went to see him with Dr. Damita Dunnings this had stopped.  I was worried that these might represent partial complex seizures but again the patient stopped doing this movement. Differential diagnosis includes, but is not limited to, sepsis possibly from pneumonia.  Patient's been coughing for a week.  Patient's presentation is most consistent with acute presentation with potential threat to life or bodily function.  The patient is on the cardiac monitor to evaluate for evidence of arrhythmia and/or significant heart rate changes.  Patient will desat at times other times his saturations are good.  On 2 L his saturations are maintained in the high 90s.      FINAL CLINICAL IMPRESSION(S) / ED DIAGNOSES   Final diagnoses:  Altered mental status, unspecified altered mental status type  Sepsis with encephalopathy and septic shock, due to unspecified organism [facility])     Rx / DC Orders   ED Discharge Orders     None        Note:  This document was prepared using Dragon voice recognition software and may include unintentional dictation errors.   Nena Polio, MD 12/18/21 [phone]

## 2021-12-18 NOTE — Sepsis Progress Note (Signed)
[facility]link is following this code sepsis ?

## 2021-12-18 NOTE — Consult Note (Signed)
CODE SEPSIS - PHARMACY COMMUNICATION  **Broad Spectrum Antibiotics should be administered within 1 hour of Sepsis diagnosis**  Time Code Sepsis Called/Page Received: 1816  Antibiotics Ordered: 1816  Time of 1st antibiotic administration: 1920  Additional action taken by pharmacy: n/a  If necessary, Name of Provider/Nurse Contacted: n/a    Darnelle Bos ,PharmD Clinical Pharmacist  12/18/2021  6:15 PM

## 2021-12-18 NOTE — ED Triage Notes (Signed)
[facility] reports pt coming from memory care unit at [facility] for c/o cough for the past week and progressively getting worse and staff states pt not responding appropriately. Pt normally ambulatory.

## 2021-12-19 DIAGNOSIS — F03C0 Unspecified dementia, severe, without behavioral disturbance, psychotic disturbance, mood disturbance, and anxiety: Secondary | ICD-10-CM | POA: Diagnosis not present

## 2021-12-19 DIAGNOSIS — F0284 Dementia in other diseases classified elsewhere, unspecified severity, with anxiety: Secondary | ICD-10-CM | POA: Diagnosis present

## 2021-12-19 DIAGNOSIS — Z20822 Contact with and (suspected) exposure to covid-19: Secondary | ICD-10-CM | POA: Diagnosis present

## 2021-12-19 DIAGNOSIS — Z8249 Family history of ischemic heart disease and other diseases of the circulatory system: Secondary | ICD-10-CM | POA: Diagnosis not present

## 2021-12-19 DIAGNOSIS — R6520 Severe sepsis without septic shock: Secondary | ICD-10-CM | POA: Diagnosis present

## 2021-12-19 DIAGNOSIS — E872 Acidosis, unspecified: Secondary | ICD-10-CM | POA: Diagnosis present

## 2021-12-19 DIAGNOSIS — Z905 Acquired absence of kidney: Secondary | ICD-10-CM | POA: Diagnosis not present

## 2021-12-19 DIAGNOSIS — Z515 Encounter for palliative care: Secondary | ICD-10-CM | POA: Diagnosis not present

## 2021-12-19 DIAGNOSIS — M1990 Unspecified osteoarthritis, unspecified site: Secondary | ICD-10-CM | POA: Diagnosis present

## 2021-12-19 DIAGNOSIS — Z66 Do not resuscitate: Secondary | ICD-10-CM | POA: Diagnosis present

## 2021-12-19 DIAGNOSIS — G309 Alzheimer's disease, unspecified: Secondary | ICD-10-CM | POA: Diagnosis present

## 2021-12-19 DIAGNOSIS — Z96642 Presence of left artificial hip joint: Secondary | ICD-10-CM | POA: Diagnosis present

## 2021-12-19 DIAGNOSIS — R011 Cardiac murmur, unspecified: Secondary | ICD-10-CM | POA: Diagnosis present

## 2021-12-19 DIAGNOSIS — I700 Atherosclerosis of aorta: Secondary | ICD-10-CM | POA: Diagnosis present

## 2021-12-19 DIAGNOSIS — Z7189 Other specified counseling: Secondary | ICD-10-CM | POA: Diagnosis not present

## 2021-12-19 DIAGNOSIS — I10 Essential (primary) hypertension: Secondary | ICD-10-CM | POA: Diagnosis not present

## 2021-12-19 DIAGNOSIS — E876 Hypokalemia: Secondary | ICD-10-CM | POA: Diagnosis not present

## 2021-12-19 DIAGNOSIS — R627 Adult failure to thrive: Secondary | ICD-10-CM | POA: Diagnosis present

## 2021-12-19 DIAGNOSIS — G9341 Metabolic encephalopathy: Secondary | ICD-10-CM | POA: Diagnosis present

## 2021-12-19 DIAGNOSIS — J189 Pneumonia, unspecified organism: Secondary | ICD-10-CM

## 2021-12-19 DIAGNOSIS — R1311 Dysphagia, oral phase: Secondary | ICD-10-CM | POA: Diagnosis present

## 2021-12-19 DIAGNOSIS — I129 Hypertensive chronic kidney disease with stage 1 through stage 4 chronic kidney disease, or unspecified chronic kidney disease: Secondary | ICD-10-CM | POA: Diagnosis present

## 2021-12-19 DIAGNOSIS — A419 Sepsis, unspecified organism: Secondary | ICD-10-CM | POA: Diagnosis present

## 2021-12-19 DIAGNOSIS — Z8261 Family history of arthritis: Secondary | ICD-10-CM | POA: Diagnosis not present

## 2021-12-19 DIAGNOSIS — Z993 Dependence on wheelchair: Secondary | ICD-10-CM | POA: Diagnosis not present

## 2021-12-19 DIAGNOSIS — Z86718 Personal history of other venous thrombosis and embolism: Secondary | ICD-10-CM | POA: Diagnosis not present

## 2021-12-19 DIAGNOSIS — Z7901 Long term (current) use of anticoagulants: Secondary | ICD-10-CM | POA: Diagnosis not present

## 2021-12-19 LAB — BASIC METABOLIC PANEL
Anion gap: 5 (ref 5–15)
BUN: 17 mg/dL (ref 8–23)
CO2: 25 mmol/L (ref 22–32)
Calcium: 8.1 mg/dL — ABNORMAL LOW (ref 8.9–10.3)
Chloride: 106 mmol/L (ref 98–111)
Creatinine, Ser: 1.09 mg/dL (ref 0.61–1.24)
GFR, Estimated: >60 mL/min (ref >60)
Glucose, Bld: 114 mg/dL — ABNORMAL HIGH (ref 70–99)
Potassium: 3.7 mmol/L (ref 3.5–5.1)
Sodium: 136 mmol/L (ref 135–145)

## 2021-12-19 LAB — URINE CULTURE: Culture: NO GROWTH

## 2021-12-19 LAB — STREP PNEUMONIAE URINARY ANTIGEN: Strep Pneumo Urinary Antigen: NEGATIVE

## 2021-12-19 LAB — CBC
HCT: 33.5 % — ABNORMAL LOW (ref 39.0–52.0)
Hemoglobin: 11.3 g/dL — ABNORMAL LOW (ref 13.0–17.0)
MCH: 28.8 pg (ref 26.0–34.0)
MCHC: 33.7 g/dL (ref 30.0–36.0)
MCV: 85.5 fL (ref 80.0–100.0)
Platelets: 216 10*3/uL (ref 150–400)
RBC: 3.92 MIL/uL — ABNORMAL LOW (ref 4.22–5.81)
RDW: 14.4 % (ref 11.5–15.5)
WBC: 11.6 10*3/uL — ABNORMAL HIGH (ref 4.0–10.5)
nRBC: 0 % (ref 0.0–0.2)

## 2021-12-19 LAB — CORTISOL-AM, BLOOD: Cortisol - AM: 7.7 ug/dL (ref 6.7–22.6)

## 2021-12-19 LAB — LACTIC ACID, PLASMA
Lactic Acid, Venous: 1.6 mmol/L (ref 0.5–1.9)
Lactic Acid, Venous: 1.3 mmol/L (ref 0.5–1.9)

## 2021-12-19 LAB — MRSA NEXT GEN BY PCR, NASAL: MRSA by PCR Next Gen: NOT DETECTED

## 2021-12-19 LAB — PROCALCITONIN: Procalcitonin: 1.1 ng/mL

## 2021-12-19 MED ORDER — SODIUM CHLORIDE 0.9 % IV SOLN: 500.0000 mg | INTRAVENOUS | Status: DC

## 2021-12-19 MED ORDER — SENNOSIDES-DOCUSATE SODIUM 8.6-50 MG PO TABS
1.0000 | ORAL_TABLET | Freq: Two times a day (BID) | ORAL | Status: DC
  Administered 2021-12-20 – 2021-12-21 (×3): 1 via ORAL
  Filled 2021-12-19 (×3): qty 1

## 2021-12-19 MED ORDER — ENOXAPARIN SODIUM 40 MG/0.4ML IJ SOSY
40.0000 mg | PREFILLED_SYRINGE | INTRAMUSCULAR | Status: DC
  Administered 2021-12-19 – 2021-12-20 (×2): 40 mg via SUBCUTANEOUS
  Filled 2021-12-19 (×2): qty 0.4

## 2021-12-19 MED ORDER — ONDANSETRON HCL 4 MG/2ML IJ SOLN: 4.0000 mg | Freq: Four times a day (QID) | INTRAMUSCULAR | Status: DC | PRN

## 2021-12-19 MED ORDER — ONDANSETRON HCL 4 MG PO TABS: 4.0000 mg | ORAL_TABLET | Freq: Four times a day (QID) | ORAL | Status: DC | PRN

## 2021-12-19 MED ORDER — MEMANTINE HCL 5 MG PO TABS
10.0000 mg | ORAL_TABLET | Freq: Two times a day (BID) | ORAL | Status: DC
  Administered 2021-12-20 – 2021-12-22 (×5): 10 mg via ORAL
  Filled 2021-12-19 (×5): qty 2

## 2021-12-19 MED ORDER — POLYVINYL ALCOHOL 1.4 % OP SOLN: 1.0000 [drp] | Freq: Two times a day (BID) | OPHTHALMIC | Status: DC | PRN

## 2021-12-19 MED ORDER — ACETAMINOPHEN 650 MG RE SUPP: 650.0000 mg | Freq: Four times a day (QID) | RECTAL | Status: DC | PRN

## 2021-12-19 MED ORDER — ACETAMINOPHEN 325 MG PO TABS: 650.0000 mg | ORAL_TABLET | Freq: Four times a day (QID) | ORAL | Status: DC | PRN

## 2021-12-19 MED ORDER — SODIUM CHLORIDE 0.9 % IV SOLN: 2.0000 g | INTRAVENOUS | Status: DC

## 2021-12-19 MED ORDER — DULOXETINE HCL 30 MG PO CPEP
30.0000 mg | ORAL_CAPSULE | Freq: Every day | ORAL | Status: DC
  Administered 2021-12-20 – 2021-12-22 (×3): 30 mg via ORAL
  Filled 2021-12-19 (×3): qty 1

## 2021-12-19 MED ORDER — DONEPEZIL HCL 5 MG PO TABS
5.0000 mg | ORAL_TABLET | Freq: Every day | ORAL | Status: DC
  Administered 2021-12-20 – 2021-12-21 (×2): 5 mg via ORAL
  Filled 2021-12-19 (×2): qty 1

## 2021-12-19 NOTE — Assessment & Plan Note (Signed)
Patient lethargic, arouses but readily falls back asleep Secondary to sepsis We will keep n.p.o. overnight Neurologic checks with aspiration precautions

## 2021-12-19 NOTE — TOC CM/SW Note (Signed)
Patient resides at [facility] in their [place] Unit. CSW confirmed with Seth Bake in Admissions. TOC to follow.  Robert Le, [place]

## 2021-12-19 NOTE — Progress Notes (Signed)
[facility]                                                                               Robert Le, is a 81 y.o. male, DOB - 13-Jun-1940, WGN:[id number] Admit date - 12/18/2021    Outpatient Primary MD for the patient is [name], MD  LOS - 0  days    Brief summary   Robert Le is a 81 y.o. male with medical history significant for dementia, hypertension, lbrought to the ER due to altered mental status, that was preceded by a week of coughing.  Unable to obtain history due to lethargy and altered mental status with baseline dementia  He was found to have bilateral lower lobe pneumonia.    Assessment & Plan    Assessment and Plan: * Community acquired bilateral lower lobe pneumonia Sepsis Sepsis criteria includes fever, tachycardia, leukocytosis with lactic acidosis and altered mental status Continue with broad spectrum IV antibiotics.  [place] oxygen to keep sats greater than 90%.  Get urine for strep pneumonia and legionella.  Sputum cultures if able to obtain.    Acute metabolic encephalopathy in th setting of dementia Patient lethargic, arouses but readily falls back asleep. Pt still very lethargic.   Suspect secondary to sepsis Aspiration precautions.  SLP eval for oral intake.  Restart home meds when able to take by mouth.   Hypertension BP parameters are optimal.   History of DVT (deep vein thrombosis) Chronic anticoagulation Hold Eliquis as unsafe to swallow Patient was started on Eliquis in March 2023 for DVT so likely safe to discontinue at this time.  Will not bridge with heparin as patient is likely at the end of treatment course for DVT DVT prophylaxis with Lovenox    Therapy evaluation ordered.  Palliative care consulted for goals of care.         Estimated body mass index is 26.94 kg/m as calculated from the following:   Height as of this encounter: '5\' 7"'$  (1.702 m).   Weight as of this encounter: 78 kg.  Code  Status: DNR DVT Prophylaxis:  enoxaparin (LOVENOX) injection 40 mg Start: 12/19/21 0800   Level of Care: Level of care: Telemetry Medical Family Communication: NONE AT BEDSIDE.   Disposition Plan:     Remains inpatient appropriate:  acute metabolic encephalopathy. Pneumonia.    Procedures:  None.   Consultants:   Palliative care.   Antimicrobials:   Anti-infectives (From admission, onward)    Start     Dose/Rate Route Frequency Ordered Stop   12/19/21 0130  cefTRIAXone (ROCEPHIN) 2 g in sodium chloride 0.9 % 100 mL IVPB  Status:  Discontinued        2 g 200 mL/hr over 30 Minutes Intravenous Every 24 hours 12/19/21 0117 12/19/21 0123   12/19/21 0130  azithromycin (ZITHROMAX) 500 mg in sodium chloride 0.9 % 250 mL IVPB  Status:  Discontinued        500 mg 250 mL/hr over 60 Minutes Intravenous Every 24 hours 12/19/21 0117 12/19/21 0123   12/18/21 1815  cefTRIAXone (ROCEPHIN) 2 g in sodium chloride 0.9 % 100 mL IVPB  2 g 200 mL/hr over 30 Minutes Intravenous Every 24 hours 12/18/21 1813 12/23/21 1814   12/18/21 1815  azithromycin (ZITHROMAX) 500 mg in sodium chloride 0.9 % 250 mL IVPB        500 mg 250 mL/hr over 60 Minutes Intravenous Every 24 hours 12/18/21 1813 12/23/21 1814        Medications  Scheduled Meds:  enoxaparin (LOVENOX) injection  40 mg Subcutaneous Q24H   LORazepam  1 mg Intravenous Once   Continuous Infusions:  azithromycin Stopped (12/18/21 2037)   cefTRIAXone (ROCEPHIN)  IV Stopped (12/18/21 2023)   lactated ringers 150 mL/hr at 12/19/21 0455   PRN Meds:.acetaminophen **OR** acetaminophen, ondansetron **OR** ondansetron (ZOFRAN) IV    Subjective:   Robert Le was seen and examined today.  Lethargic on 2lit of  oxygen.   Objective:   Vitals:   12/19/21 0213 12/19/21 0413 12/19/21 0622 12/19/21 0859  BP: (!) 112/59 (!) 115/54 (!) 116/59 (!) 113/48  Pulse: 88 78 80 94  Resp: '20 18 20 20  '$ Temp: 99.4 F (37.4 C) 98 F (36.7 C) 98  F (36.7 C) 98.4 F (36.9 C)  TempSrc:      SpO2: 99% 99% 96% 96%  Weight:      Height:        Intake/Output Summary (Last 24 hours) at 12/19/2021 1133 Last data filed at 12/19/2021 0902 Gross per 24 hour  Intake --  Output 400 ml  Net -400 ml   Filed Weights   12/18/21 1830  Weight: 78 kg     Exam General exam: Appears calm and comfortable  Respiratory system: Clear to auscultation. Respiratory effort normal. Cardiovascular system: S1 & S2 heard, RRR. No JVD,  Gastrointestinal system: Abdomen is nondistended, soft and nontender.  Central nervous system:   Extremities: Symmetric 5 x 5 power. Skin: No rashes, Psychiatry: unable to assess,    Data Reviewed:  I have personally reviewed following labs and imaging studies   CBC Lab Results  Component Value Date   WBC 11.6 (H) 12/19/2021   RBC 3.92 (L) 12/19/2021   HGB 11.3 (L) 12/19/2021   HCT 33.5 (L) 12/19/2021   MCV 85.5 12/19/2021   MCH 28.8 12/19/2021   PLT 216 12/19/2021   MCHC 33.7 12/19/2021   RDW 14.4 12/19/2021   LYMPHSABS 0.7 12/18/2021   MONOABS 0.8 12/18/2021   EOSABS 0.0 12/18/2021   BASOSABS 0.0 95/10/3265     Last metabolic panel Lab Results  Component Value Date   NA 136 12/19/2021   K 3.7 12/19/2021   CL 106 12/19/2021   CO2 25 12/19/2021   BUN 17 12/19/2021   CREATININE 1.09 12/19/2021   GLUCOSE 114 (H) 12/19/2021   GFRNONAA >60 12/19/2021   GFRAA 82 (L) 08/31/2013   CALCIUM 8.1 (L) 12/19/2021   PROT 6.2 (L) 12/18/2021   ALBUMIN 3.1 (L) 12/18/2021   BILITOT 0.8 12/18/2021   ALKPHOS 76 12/18/2021   AST 25 12/18/2021   ALT 17 12/18/2021   ANIONGAP 5 12/19/2021    CBG (last 3)  No results for input(s): "GLUCAP" in the last 72 hours.    Coagulation Profile: Recent Labs  Lab 12/18/21 1837  INR 1.2     Radiology Studies: CT Chest Wo Contrast  Result Date: 12/18/2021 CLINICAL DATA:  Pneumonia, complication suspected, xray done EXAM: CT CHEST WITHOUT CONTRAST  TECHNIQUE: Multidetector CT imaging of the chest was performed following the standard protocol without IV contrast. RADIATION DOSE REDUCTION: This exam was performed  according to the departmental dose-optimization program which includes automated exposure control, adjustment of the mA and/or kV according to patient size and/or use of iterative reconstruction technique. COMPARISON:  04/17/2021 chest CT.  Chest x-ray today. FINDINGS: Cardiovascular: Heart is normal size. Scattered coronary artery and aortic calcifications. No aneurysm. Mediastinum/Nodes: No mediastinal, hilar, or axillary adenopathy. Trachea and esophagus are unremarkable. Thyroid unremarkable. Lungs/Pleura: Bilateral lower lobe airspace opacities. No effusions. Upper Abdomen: No acute findings Musculoskeletal: Chest wall soft tissues are unremarkable. No acute bony abnormality. IMPRESSION: Bilateral lower lobe airspace opacities concerning for pneumonia. Coronary artery disease. Aortic Atherosclerosis (ICD10-I70.0). Electronically Signed   By: Rolm Baptise M.D.   On: 12/18/2021 21:24   CT Head Wo Contrast  Result Date: 12/18/2021 CLINICAL DATA:  Mental status change, unknown cause EXAM: CT HEAD WITHOUT CONTRAST TECHNIQUE: Contiguous axial images were obtained from the base of the skull through the vertex without intravenous contrast. RADIATION DOSE REDUCTION: This exam was performed according to the departmental dose-optimization program which includes automated exposure control, adjustment of the mA and/or kV according to patient size and/or use of iterative reconstruction technique. COMPARISON:  Head CT 06/14/2021 FINDINGS: Brain: Stable degree moderate atrophy. Stable degree of chronic small vessel ischemic change. No hemorrhage, acute infarct, hydrocephalus, extra-axial collection or mass lesion/mass effect. Vascular: Atherosclerosis of skullbase vasculature without hyperdense vessel or abnormal calcification. Skull: No fracture or focal  lesion. Sinuses/Orbits: There is diffuse mucosal thickening throughout the ethmoid air cells in the right maxillary sinus. Fluid levels with frothy debris in the right side of sphenoid sinus. Left maxillary sinus mucosal thickening and fluid level. Small amount of air within the right ophthalmic vein and right muscles of mastication likely related to IV catheter placement. Other: None. IMPRESSION: 1. No acute intracranial abnormality. 2. Stable atrophy and chronic small vessel ischemic change. 3. Paranasal sinus disease with fluid levels in the left maxillary and sphenoid sinuses, may be due to acute sinusitis. 4. Small amount of air within the right ophthalmic vein and right muscles of mastication likely related to IV catheter placement. Electronically Signed   By: Keith Rake M.D.   On: 12/18/2021 19:16   DG Chest Port 1 View  Result Date: 12/18/2021 CLINICAL DATA:  Respiratory distress. Questionable sepsis. Patient unconscious and unable to follow breathing instructions for cxr.Questionable sepsis - evaluate for abnormality EXAM: PORTABLE CHEST 1 VIEW COMPARISON:  06/14/2021 FINDINGS: Extremely low lung volumes similar to comparison exam. Mild venous congestion. No pneumothorax. No focal consolidation. IMPRESSION: Extremely low lung volumes and venous congestion. Electronically Signed   By: Suzy Bouchard M.D.   On: 12/18/2021 18:39       Hosie Poisson M.D. [facility] 12/19/2021, 11:33 AM  Available via Epic secure chat 7am-7pm After 7 pm, please refer to night coverage provider listed on amion.

## 2021-12-19 NOTE — Assessment & Plan Note (Signed)
Patient is normotensive Will hold antihypertensives in the setting of sepsis diagnosis

## 2021-12-19 NOTE — ED Notes (Signed)
Pt A&OX0. Pt unable to follow commands. Pt has AMS per EMS and is on a memory care unit. Pt has a significant cough that sounds wet. Unknown how pt ambulates. Pt on purewick at this time with a brief in place.

## 2021-12-19 NOTE — Assessment & Plan Note (Addendum)
Chronic anticoagulation Hold Eliquis as unsafe to swallow Patient was started on Eliquis in March 2023 for DVT so likely safe to discontinue at this time.  Will not bridge with heparin as patient is likely at the end of treatment course for DVT DVT prophylaxis with Lovenox

## 2021-12-19 NOTE — Progress Notes (Signed)
PT Cancellation Note  Patient Details Name: SAVALAS MONJE MRN: [id number] DOB: 1940/04/02   Cancelled Treatment:    Reason Eval/Treat Not Completed: Fatigue/lethargy limiting ability to participate (Consult received and chart reviewed.  Per discussion with primary RN, patient remains lethargic and unable to effectively participate with evaluation.  Will continue to follow and initiate as medically appropriate and available.)   [place] Hair H. Owens Shark, PT, DPT, NCS 12/19/21, 12:51 PM [phone]

## 2021-12-19 NOTE — Evaluation (Signed)
Clinical/Bedside Swallow Evaluation Patient Details  Name: Robert Le MRN: [id number] Date of Birth: [birth year]  Today's Date: 12/19/2021 Time: SLP Start Time (ACUTE ONLY): 46 SLP Stop Time (ACUTE ONLY): 4098 SLP Time Calculation (min) (ACUTE ONLY): 15 min  Past Medical History:  Past Medical History:  Diagnosis Date   Acute cholecystitis    Acute metabolic encephalopathy    Alzheimer disease ([place])    Anxiety    Aortic atherosclerosis ([place])    Arthritis    Chronic kidney disease    L TOTAL NEPHRECTOMY   History of DVT (deep vein thrombosis)    Hypertension    Sepsis ([place])    Past Surgical History:  Past Surgical History:  Procedure Laterality Date   [place]   IR EXCHANGE BILIARY DRAIN  06/10/2021   IR FLUORO PROCEDURE UNLISTED  07/13/2021   IR PERC CHOLECYSTOSTOMY  04/17/2021   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 male with medical history significant for dementia, hypertension, lbrought to the ER due to altered mental status, that was preceded by a week of coughing.  Unable to obtain history due to lethargy and altered mental status with baseline dementia  ED course and data review: Tmax 99.8 with pulse 110, BP 159/91, respirations 20-24 with O2 sat 92% on room air for which she was placed on O2 at 2 L.  VBG was within normal limits Labs otherwise significant for WBC of 17,000 and lactic acid 3.4.  Troponin and BNP WNL.  CMP unremarkable.  EKG nonacute  Imaging: CT head nonacute but showing sinusitis and CT chest showing Bilateral lower lobe airspace opacities concerning for pneumonia.  Head CT IMPRESSION:  1. No acute intracranial abnormality.  2. Stable atrophy and chronic small  vessel ischemic change.  3. Paranasal sinus disease with fluid levels in the left maxillary  and sphenoid sinuses, may be due to acute sinusitis.  4. Small amount of air within the right ophthalmic vein and right  muscles of mastication likely related to IV catheter placement.     Chest CT IMPRESSION:  Bilateral lower lobe airspace opacities concerning for pneumonia."    Assessment / Plan / Recommendation  Clinical Impression  Pt seen for clinical swallowing evaluation. Pt lethargic. Roused to voice initially. Confusion noted. Cleared with RN. Required assistance for repositioning from RN and SLP. Pt on 2L/min O2 via [place].   Attempted oral care with foam toothette. Pt demonstrating orally aversive behavior (e.g. clinching teeth). Oral motor exam unable to be completed as pt unable to follow commands. Concern for reduced secretion management noted given wet vocal quality and congested, wet coughing prior to POs.   Pt given trials of ice chips via teaspoon. Pt with s/sx mild oral dysphagia c/b oral holding and delayed oral transit with ice chips. Pt with seemingly delayed swallow initiaiton to palpation. Pt required verbal/tactile cues to transit ice chips posteriorly and to elicit pharyngeal swallow. Delayed cough noted. ?response to POs vs baseline cough. Pt increasingly lethargic during evaluation. Further PO deferred at this time given clinical presentation.   At present, a safe oral diet cannot be recommended. Recommend NPO with non-oral means of nutrition/hydration/medication.  SLP to f/u for re-assessment next date.    RN and MD made aware of results, recommendations, and SLP POC.   SLP Visit Diagnosis: Dysphagia, oropharyngeal phase (R13.12)    Aspiration Risk  Risk for inadequate nutrition/hydration;Severe aspiration risk    Diet Recommendation NPO   Medication Administration: Via alternative means    Other  Recommendations Recommended Consults:  (Palliative Consult) Oral Care  Recommendations: Oral care QID;Staff/trained caregiver to provide oral care    Recommendations for follow up therapy are one component of a multi-disciplinary discharge planning process, led by the attending physician.  Recommendations may be updated based on patient status, additional functional criteria and insurance authorization.  Follow up Recommendations  (TBD)      Assistance Recommended at Discharge    Functional Status Assessment Patient has had a recent decline in their functional status and demonstrates the ability to make significant improvements in function in a reasonable and predictable amount of time.  Frequency and Duration min 2x/week  2 weeks       Prognosis Prognosis for Safe Diet Advancement: Guarded Barriers to Reach Goals: Cognitive deficits;Severity of deficits;Behavior      Swallow Study   General Date of Onset: 12/18/21 HPI: Per H&P "Robert Le is a 81 y.o. male with medical history significant for dementia, hypertension, lbrought to the ER due to altered mental status, that was preceded by a week of coughing.  Unable to obtain history due to lethargy and altered mental status with baseline dementia  ED course and data review: Tmax 99.8 with pulse 110, BP 159/91, respirations 20-24 with O2 sat 92% on room air for which she was placed on O2 at 2 L.  VBG was within normal limits Labs otherwise significant for WBC of 17,000 and lactic acid 3.4.  Troponin and BNP WNL.  CMP unremarkable.  EKG nonacute  Imaging: CT head nonacute but showing sinusitis and CT chest showing Bilateral lower lobe airspace opacities concerning for pneumonia.  Head CT IMPRESSION:  1. No acute intracranial abnormality.  2. Stable atrophy and chronic small vessel ischemic change.  3. Paranasal sinus disease with fluid levels in the left maxillary  and sphenoid sinuses, may be due to acute sinusitis.  4. Small amount of air within the right ophthalmic vein and right  muscles of mastication  likely related to IV catheter placement.     Chest CT IMPRESSION:  Bilateral lower lobe airspace opacities concerning for pneumonia." Type of Study: Bedside Swallow Evaluation Previous Swallow Assessment: known to SLP services from previous hospitalizations; most recently seen March 2023 with recommendation for mech soft diet with tihn liquids Diet Prior to this Study: NPO Temperature Spikes Noted: Yes Respiratory Status: Nasal cannula History of Recent Intubation: No Behavior/Cognition: Lethargic/Drowsy;Doesn't follow directions Oral Cavity Assessment: Within Functional Limits Oral Care Completed by SLP: No (attempted; pt closed mouth in respose) Oral Cavity - Dentition:  (unable to fully assess) Self-Feeding Abilities: Total assist Baseline Vocal Quality: Wet;Low vocal intensity Volitional Cough:  (congested baseline cough; unable to produce volitional cough)    Oral/Motor/Sensory Function Overall Oral Motor/Sensory Function:  (UTA)   Ice Chips Ice chips: Impaired Presentation: Spoon Oral Phase Impairments: Reduced lingual movement/coordination;Poor awareness of bolus Oral Phase Functional Implications: Oral holding;Prolonged oral transit Pharyngeal Phase Impairments: Suspected delayed Swallow;Cough - Delayed   Thin Liquid Thin Liquid: Not tested    Nectar Thick Nectar Thick Liquid: Not tested   Honey Thick Honey Thick Liquid: Not tested   Puree Puree: Not tested  Solid     Solid: Not tested     Cherrie Gauze, M.S., [facility] [phone] [place])   [name] 12/19/2021,1:06 PM

## 2021-12-19 NOTE — Progress Notes (Signed)
Per ED RN Sarah, patient has been lethargic and only responding to sternal rub, no change in LOC.

## 2021-12-19 NOTE — H&P (Signed)
History and Physical    Patient: Robert Le IEP:[id number] DOB: 1940/12/17 DOA: 12/18/2021 DOS: the patient was seen and examined on 12/19/2021 PCP: [name], MD  Patient coming from: ALF/ILF  Chief Complaint:  Chief Complaint  Patient presents with   Altered Mental Status    HPI: Robert Le is a 81 y.o. male with medical history significant for dementia, hypertension, lbrought to the ER due to altered mental status, that was preceded by a week of coughing.  Unable to obtain history due to lethargy and altered mental status with baseline dementia ED course and data review: Tmax 99.8 with pulse 110, BP 159/91, respirations 20-24 with O2 sat 92% on room air for which she was placed on O2 at 2 L.  VBG was within normal limits Labs otherwise significant for WBC of 17,000 and lactic acid 3.4.  Troponin and BNP WNL.  CMP unremarkable. EKG nonacute Imaging: CT head nonacute but showing sinusitis and CT chest showing Bilateral lower lobe airspace opacities concerning for pneumonia. Head CT IMPRESSION: 1. No acute intracranial abnormality. 2. Stable atrophy and chronic small vessel ischemic change. 3. Paranasal sinus disease with fluid levels in the left maxillary and sphenoid sinuses, may be due to acute sinusitis. 4. Small amount of air within the right ophthalmic vein and right muscles of mastication likely related to IV catheter placement.  Chest CT IMPRESSION: Bilateral lower lobe airspace opacities concerning for pneumonia.   Patient was started on sepsis fluids, Rocephin and azithromycin and hospitalist consulted for admission.     Past Medical History:  Diagnosis Date   Acute cholecystitis    Acute metabolic encephalopathy    Alzheimer disease (HCC)    Anxiety    Aortic atherosclerosis (HCC)    Arthritis    Chronic kidney disease    L TOTAL NEPHRECTOMY   History of DVT (deep vein thrombosis)    Hypertension    Sepsis [facility])    Past Surgical  History:  Procedure Laterality Date   FRACTURE SURGERY     FRACTURED HIP SURGERY 1960   IR EXCHANGE BILIARY DRAIN  06/10/2021   IR FLUORO PROCEDURE UNLISTED  07/13/2021   IR PERC CHOLECYSTOSTOMY  04/17/2021   NEPHRECTOMY  1985   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 He has never used smokeless tobacco. He reports that he does not drink alcohol and does not use drugs.  Allergies  Allergen Reactions   Donepezil Other (See Comments)    Increased confusion Still listed on pt's current med list as of 06/14/2021   Sulfites     Unable to recall reaction or specific sulfa drug    Family History  Problem Relation Age of Onset   Heart disease Father    Arthritis Brother    Arthritis Brother    Cancer Neg Hx    Diabetes Neg Hx     Prior to Admission medications   Medication Sig Start Date End Date Taking? Authorizing Provider  amLODipine (NORVASC) 5 MG tablet Take 5 mg by mouth daily.   Yes [provider]  Cholecalciferol (VITAMIN D3) 1.25 MG (50000 UT) CAPS Take 1 capsule by mouth every morning.   Yes [provider]  donepezil (ARICEPT) 5 MG tablet Take 5 mg by  mouth every evening. 02/23/21  Yes [provider]  DULoxetine (CYMBALTA) 30 MG capsule Give Two capsules by mouth in the morning; Give One capsule by mouth in the evening daily at 4pm   Yes [provider]  memantine (NAMENDA) 10 MG tablet Take 10 mg by mouth 2 (two) times daily. 02/23/21  Yes [provider]  Multiple Vitamin (THERA-TABS PO) Take 1 tablet by mouth daily at 6 (six) AM.   Yes [provider]  Polyethylene Glycol 3350 (PEG 3350) 17 g PACK Take 1 packet by mouth every other day.   Yes [provider]  senna-docusate  (SENNA-S) 8.6-50 MG tablet Take 1 tablet by mouth 2 (two) times daily.   Yes [provider]  acetaminophen (TYLENOL) 325 MG tablet Take 650 mg by mouth every 6 (six) hours as needed.    [provider]  carboxymethylcellulose (ARTIFICIAL TEARS) 1 % ophthalmic solution Apply 1 drop to eye 2 (two) times daily as needed.    [provider]  HYDROcodone-acetaminophen (NORCO/VICODIN) 5-325 MG tablet Take 1 tablet by mouth every 4 (four) hours as needed for moderate pain.    [provider]  OXYGEN Inhale 2 L into the lungs as needed (to keep O2 sats > 90%).    [provider]    Physical Exam: Vitals:   12/18/21 2330 12/19/21 0000 12/19/21 0030 12/19/21 0055  BP: 130/70 132/73 121/75   Pulse: 93  (!) 103   Resp: 20     Temp:    99.2 F (37.3 C)  TempSrc:    Axillary  SpO2: 96%  99%   Weight:      Height:       Physical Exam Constitutional:      Appearance: He is ill-appearing.  HENT:     Nose: Congestion present.     Comments: Congested cough Cardiovascular:     Rate and Rhythm: Tachycardia present.  Pulmonary:     Breath sounds: Transmitted upper airway sounds present.  Neurological:     General: No focal deficit present.     Mental Status: He is lethargic.     Labs on Admission: I have personally reviewed following labs and imaging studies  CBC: Recent Labs  Lab 12/18/21 1837  WBC 17.0*  NEUTROABS 15.3*  HGB 13.5  HCT 39.9  MCV 85.4  PLT 010   Basic Metabolic Panel: Recent Labs  Lab 12/18/21 1837  NA 137  K 3.5  CL 106  CO2 23  GLUCOSE 131*  BUN 21  CREATININE 1.15  CALCIUM 8.1*   GFR: Estimated Creatinine Clearance: 45.5 mL/min (by C-G formula based on SCr of 1.15 mg/dL). Liver Function Tests: Recent Labs  Lab 12/18/21 1837  AST 25  ALT 17  ALKPHOS 76  BILITOT 0.8  PROT 6.2*  ALBUMIN 3.1*   No results for input(s): "LIPASE", "AMYLASE" in the last 168 hours. No results for input(s): "AMMONIA" in the  last 168 hours. Coagulation Profile: Recent Labs  Lab 12/18/21 1837  INR 1.2   Cardiac Enzymes: No results for input(s): "CKTOTAL", "CKMB", "CKMBINDEX", "TROPONINI" in the last 168 hours. BNP (last 3 results) No results for input(s): "PROBNP" in the last 8760 hours. HbA1C: No results for input(s): "HGBA1C" in the last 72 hours. CBG: No results for input(s): "GLUCAP" in the last 168 hours. Lipid Profile: No results for input(s): "CHOL", "HDL", "LDLCALC", "TRIG", "CHOLHDL", "LDLDIRECT" in the last 72 hours. Thyroid Function Tests: No results for input(s): "TSH", "T4TOTAL", "FREET4", "  T3FREE", "THYROIDAB" in the last 72 hours. Anemia Panel: No results for input(s): "VITAMINB12", "FOLATE", "FERRITIN", "TIBC", "IRON", "RETICCTPCT" in the last 72 hours. Urine analysis:    Component Value Date/Time   COLORURINE YELLOW (A) 12/18/2021 1907   APPEARANCEUR HAZY (A) 12/18/2021 1907   LABSPEC 1.025 12/18/2021 1907   PHURINE 5.0 12/18/2021 1907   GLUCOSEU NEGATIVE 12/18/2021 1907   HGBUR NEGATIVE 12/18/2021 1907   BILIRUBINUR NEGATIVE 12/18/2021 1907   KETONESUR NEGATIVE 12/18/2021 1907   PROTEINUR NEGATIVE 12/18/2021 1907   UROBILINOGEN 0.2 08/31/2013 1723   NITRITE NEGATIVE 12/18/2021 1907   LEUKOCYTESUR NEGATIVE 12/18/2021 1907    Radiological Exams on Admission: CT Chest Wo Contrast  Result Date: 12/18/2021 CLINICAL DATA:  Pneumonia, complication suspected, xray done EXAM: CT CHEST WITHOUT CONTRAST TECHNIQUE: Multidetector CT imaging of the chest was performed following the standard protocol without IV contrast. RADIATION DOSE REDUCTION: This exam was performed according to the departmental dose-optimization program which includes automated exposure control, adjustment of the mA and/or kV according to patient size and/or use of iterative reconstruction technique. COMPARISON:  04/17/2021 chest CT.  Chest x-ray today. FINDINGS: Cardiovascular: Heart is normal size. Scattered coronary  artery and aortic calcifications. No aneurysm. Mediastinum/Nodes: No mediastinal, hilar, or axillary adenopathy. Trachea and esophagus are unremarkable. Thyroid unremarkable. Lungs/Pleura: Bilateral lower lobe airspace opacities. No effusions. Upper Abdomen: No acute findings Musculoskeletal: Chest wall soft tissues are unremarkable. No acute bony abnormality. IMPRESSION: Bilateral lower lobe airspace opacities concerning for pneumonia. Coronary artery disease. Aortic Atherosclerosis (ICD10-I70.0). Electronically Signed   By: Rolm Baptise M.D.   On: 12/18/2021 21:24   CT Head Wo Contrast  Result Date: 12/18/2021 CLINICAL DATA:  Mental status change, unknown cause EXAM: CT HEAD WITHOUT CONTRAST TECHNIQUE: Contiguous axial images were obtained from the base of the skull through the vertex without intravenous contrast. RADIATION DOSE REDUCTION: This exam was performed according to the departmental dose-optimization program which includes automated exposure control, adjustment of the mA and/or kV according to patient size and/or use of iterative reconstruction technique. COMPARISON:  Head CT 06/14/2021 FINDINGS: Brain: Stable degree moderate atrophy. Stable degree of chronic small vessel ischemic change. No hemorrhage, acute infarct, hydrocephalus, extra-axial collection or mass lesion/mass effect. Vascular: Atherosclerosis of skullbase vasculature without hyperdense vessel or abnormal calcification. Skull: No fracture or focal lesion. Sinuses/Orbits: There is diffuse mucosal thickening throughout the ethmoid air cells in the right maxillary sinus. Fluid levels with frothy debris in the right side of sphenoid sinus. Left maxillary sinus mucosal thickening and fluid level. Small amount of air within the right ophthalmic vein and right muscles of mastication likely related to IV catheter placement. Other: None. IMPRESSION: 1. No acute intracranial abnormality. 2. Stable atrophy and chronic small vessel ischemic  change. 3. Paranasal sinus disease with fluid levels in the left maxillary and sphenoid sinuses, may be due to acute sinusitis. 4. Small amount of air within the right ophthalmic vein and right muscles of mastication likely related to IV catheter placement. Electronically Signed   By: Keith Rake M.D.   On: 12/18/2021 19:16   DG Chest Port 1 View  Result Date: 12/18/2021 CLINICAL DATA:  Respiratory distress. Questionable sepsis. Patient unconscious and unable to follow breathing instructions for cxr.Questionable sepsis - evaluate for abnormality EXAM: PORTABLE CHEST 1 VIEW COMPARISON:  06/14/2021 FINDINGS: Extremely low lung volumes similar to comparison exam. Mild venous congestion. No pneumothorax. No focal consolidation. IMPRESSION: Extremely low lung volumes and venous congestion. Electronically Signed   By: Suzy Bouchard  M.D.   On: 12/18/2021 18:39     Data Reviewed: Relevant notes from primary care and specialist visits, past discharge summaries as available in EHR, including Care Everywhere. Prior diagnostic testing as pertinent to current admission diagnoses Updated medications and problem lists for reconciliation ED course, including vitals, labs, imaging, treatment and response to treatment Triage notes, nursing and pharmacy notes and ED provider's notes Notable results as noted in HPI   Assessment and Plan: * Community acquired bilateral lower lobe pneumonia Sepsis Sepsis criteria includes fever, tachycardia, leukocytosis with lactic acidosis and altered mental status Continue sepsis fluids Rocephin vancomycin We will keep n.p.o. overnight until more alert  Acute metabolic encephalopathy Patient lethargic, arouses but readily falls back asleep Secondary to sepsis We will keep n.p.o. overnight Neurologic checks with aspiration precautions  Hypertension Patient is normotensive Will hold antihypertensives in the setting of sepsis diagnosis  History of DVT (deep vein  thrombosis) Chronic anticoagulation Hold Eliquis as unsafe to swallow Patient was started on Eliquis in March 2023 for DVT so likely safe to discontinue at this time.  Will not bridge with heparin as patient is likely at the end of treatment course for DVT        DVT prophylaxis: Lovenox  Consults: none  Advance Care Planning:   Code Status: Prior   Family Communication: none  Disposition Plan: Back to previous home environment  Severity of Illness: The appropriate patient status for this patient is INPATIENT. Inpatient status is judged to be reasonable and necessary in order to provide the required intensity of service to ensure the patient's safety. The patient's presenting symptoms, physical exam findings, and initial radiographic and laboratory data in the context of their chronic comorbidities is felt to place them at high risk for further clinical deterioration. Furthermore, it is not anticipated that the patient will be medically stable for discharge from the hospital within 2 midnights of admission.   * I certify that at the point of admission it is my clinical judgment that the patient will require inpatient hospital care spanning beyond 2 midnights from the point of admission due to high intensity of service, high risk for further deterioration and high frequency of surveillance required.*  Author: Athena Masse, MD 12/19/2021 1:05 AM  For on call review [email or web address].

## 2021-12-20 DIAGNOSIS — F0280 Dementia in other diseases classified elsewhere without behavioral disturbance: Secondary | ICD-10-CM

## 2021-12-20 DIAGNOSIS — G309 Alzheimer's disease, unspecified: Secondary | ICD-10-CM | POA: Diagnosis not present

## 2021-12-20 DIAGNOSIS — I10 Essential (primary) hypertension: Secondary | ICD-10-CM

## 2021-12-20 DIAGNOSIS — G9341 Metabolic encephalopathy: Secondary | ICD-10-CM

## 2021-12-20 DIAGNOSIS — Z7189 Other specified counseling: Secondary | ICD-10-CM | POA: Diagnosis not present

## 2021-12-20 DIAGNOSIS — J189 Pneumonia, unspecified organism: Secondary | ICD-10-CM | POA: Diagnosis not present

## 2021-12-20 LAB — CBC WITH DIFFERENTIAL/PLATELET
Abs Immature Granulocytes: 0.05 10*3/uL (ref 0.00–0.07)
Basophils Absolute: 0 10*3/uL (ref 0.0–0.1)
Basophils Relative: 0 %
Eosinophils Absolute: 0.1 10*3/uL (ref 0.0–0.5)
Eosinophils Relative: 1 %
HCT: 35.8 % — ABNORMAL LOW (ref 39.0–52.0)
Hemoglobin: 12.1 g/dL — ABNORMAL LOW (ref 13.0–17.0)
Immature Granulocytes: 1 %
Lymphocytes Relative: 21 %
Lymphs Abs: 1.6 10*3/uL (ref 0.7–4.0)
MCH: 28.7 pg (ref 26.0–34.0)
MCHC: 33.8 g/dL (ref 30.0–36.0)
MCV: 84.8 fL (ref 80.0–100.0)
Monocytes Absolute: 0.7 10*3/uL (ref 0.1–1.0)
Monocytes Relative: 10 %
Neutro Abs: 5.3 10*3/uL (ref 1.7–7.7)
Neutrophils Relative %: 67 %
Platelets: 233 10*3/uL (ref 150–400)
RBC: 4.22 MIL/uL (ref 4.22–5.81)
RDW: 13.9 % (ref 11.5–15.5)
WBC: 7.8 10*3/uL (ref 4.0–10.5)
nRBC: 0 % (ref 0.0–0.2)

## 2021-12-20 LAB — BASIC METABOLIC PANEL
Anion gap: 6 (ref 5–15)
BUN: 12 mg/dL (ref 8–23)
CO2: 26 mmol/L (ref 22–32)
Calcium: 9 mg/dL (ref 8.9–10.3)
Chloride: 106 mmol/L (ref 98–111)
Creatinine, Ser: 0.86 mg/dL (ref 0.61–1.24)
GFR, Estimated: >60 mL/min (ref >60)
Glucose, Bld: 89 mg/dL (ref 70–99)
Potassium: 3.8 mmol/L (ref 3.5–5.1)
Sodium: 138 mmol/L (ref 135–145)

## 2021-12-20 MED ORDER — APIXABAN 5 MG PO TABS
5.0000 mg | ORAL_TABLET | Freq: Two times a day (BID) | ORAL | Status: DC
  Administered 2021-12-20 – 2021-12-22 (×4): 5 mg via ORAL
  Filled 2021-12-20 (×4): qty 1

## 2021-12-20 MED ORDER — HYDRALAZINE HCL 50 MG PO TABS
25.0000 mg | ORAL_TABLET | Freq: Three times a day (TID) | ORAL | Status: DC | PRN
  Administered 2021-12-21 (×2): 25 mg via ORAL
  Filled 2021-12-20 (×2): qty 1

## 2021-12-20 NOTE — Progress Notes (Addendum)
Speech Language Pathology Treatment: Dysphagia  Patient Details Name: Robert Le MRN: [id number] DOB: Oct 28, 1940 Today's Date: 12/20/2021 Time: 5681-2751 SLP Time Calculation (min) (ACUTE ONLY): 15 min  Assessment / Plan / Recommendation Clinical Impression  Pt seen for clinical swallowing re-evaluation. Pt alert. Confusion evident. Pt on 2L/min O2 via [place]. Baseline congested cough noted with repositioning.  Pt given trials of solid (x2 graham cracker presented in quarters), pureed (applesauce; ~2 oz), and thin liquids via straw (~4 oz via single and consecutive straw sip). Despite verbal/tactile cueing, pt unable to feed self. Pt with s/sx mild oral dysphagia c/b mildly prolonged mastication and trace-mild oral residual with solid which cleared with liquid wash. Oral deficits appear c/w with baseline per chart review. Pharyngeal swallow appeared [facility] per clinical re-assessment. Pt required verbal cues to siphon liquids from straw initially.   Recommend initiation of mech soft diet with thin liquids and safe swallowing strategies/aspiration precautions as outlined below.   Pt remains at increased risk for aspiration/aspiration PNA given mental status, multiple medical comorbities, and dependence for feeding.   SLP to f/u x1 for diet tolerance.   RN made aware of results, recommendations, and SLP POC. Pt also made aware; however, suspect reduced understanding given AMS/cognition.    HPI HPI: Per H&P "Robert Le is a 81 y.o. male with medical history significant for dementia, hypertension, lbrought to the ER due to altered mental status, that was preceded by a week of coughing.  Unable to obtain history due to lethargy and altered mental status with baseline dementia  ED course and data review: Tmax 99.8 with pulse 110, BP 159/91, respirations 20-24 with O2 sat 92% on room air for which she was placed on O2 at 2 L.  VBG was within normal limits Labs otherwise significant for WBC of  17,000 and lactic acid 3.4.  Troponin and BNP WNL.  CMP unremarkable.  EKG nonacute  Imaging: CT head nonacute but showing sinusitis and CT chest showing Bilateral lower lobe airspace opacities concerning for pneumonia.  Head CT IMPRESSION:  1. No acute intracranial abnormality.  2. Stable atrophy and chronic small vessel ischemic change.  3. Paranasal sinus disease with fluid levels in the left maxillary  and sphenoid sinuses, may be due to acute sinusitis.  4. Small amount of air within the right ophthalmic vein and right  muscles of mastication likely related to IV catheter placement.     Chest CT IMPRESSION:  Bilateral lower lobe airspace opacities concerning for pneumonia."      SLP Plan  Continue with current plan of care      Recommendations for follow up therapy are one component of a multi-disciplinary discharge planning process, led by the attending physician.  Recommendations may be updated based on patient status, additional functional criteria and insurance authorization.    Recommendations  Diet recommendations: Dysphagia 3 (mechanical soft);Thin liquid Medication Administration: Crushed with puree Supervision: Full supervision/cueing for compensatory strategies Compensations: Minimize environmental distractions;Slow rate;Small sips/bites;Follow solids with liquid Postural Changes and/or Swallow Maneuvers: Seated upright 90 degrees;Upright 30-60 min after meal                Oral Care Recommendations: Oral care QID;Staff/trained caregiver to provide oral care Follow Up Recommendations: No SLP follow up Assistance recommended at discharge: Frequent or constant Supervision/Assistance SLP Visit Diagnosis: Dysphagia, oropharyngeal phase (R13.12) Plan: Continue with current plan of care         Cherrie Gauze, M.S., [place]  [facility] [phone] [place])    [name]  12/20/2021, 9:39 AM

## 2021-12-20 NOTE — Progress Notes (Addendum)
[facility]                                                                               Robert Le, is a 81 y.o. male, DOB - December 07, 1940, KWI:[id number] Admit date - 12/18/2021    Outpatient Primary MD for the patient is [name], MD  LOS - 1  days    Brief summary   Robert Le is a 81 y.o. male with medical history significant for dementia, hypertension, lbrought to the ER due to altered mental status, that was preceded by a week of coughing.  Unable to obtain history due to lethargy and altered mental status with baseline dementia  He was found to have bilateral lower lobe pneumonia.    Assessment & Plan    Assessment and Plan: * Community acquired bilateral lower lobe pneumonia Sepsis Sepsis criteria includes fever, tachycardia, leukocytosis with lactic acidosis and altered mental status Continue with broad spectrum IV antibiotics. Currently on 2 lit of [place] oxygen.  [place] oxygen to keep sats greater than 90%.  Pro calcitonin is 1.1, lactic acid normalized. Wbc count normalized.   Negative urine for strep pneumonia and legionella antigen is pending. Marland Kitchen  Sputum cultures if able to obtain.    Acute metabolic encephalopathy in th setting of dementia Patient lethargic, arouses but readily falls back asleep. Pt still very lethargic.   Suspect secondary to sepsis Aspiration precautions.  SLP eval recommended dysphagia 3 diet .  Restart home meds when able to take by mouth.   Hypertension BP parameters are suboptimal.  Added prn hydralazine.   History of DVT (deep vein thrombosis) Chronic anticoagulation Restart the eliquis.     Therapy evaluation ordered.  Palliative care consulted for goals of care.         Estimated body mass index is 26.94 kg/m as calculated from the following:   Height as of this encounter: '5\' 7"'$  (1.702 m).   Weight as of this encounter: 78 kg.  Code Status: DNR DVT Prophylaxis:  enoxaparin (LOVENOX)  injection 40 mg Start: 12/19/21 0800   Level of Care: Level of care: Telemetry Medical Family Communication: NONE AT BEDSIDE.   Disposition Plan:     Remains inpatient appropriate:  acute metabolic encephalopathy. Pneumonia.    Procedures:  None.   Consultants:   Palliative care.   Antimicrobials:   Anti-infectives (From admission, onward)    Start     Dose/Rate Route Frequency Ordered Stop   12/19/21 0130  cefTRIAXone (ROCEPHIN) 2 g in sodium chloride 0.9 % 100 mL IVPB  Status:  Discontinued        2 g 200 mL/hr over 30 Minutes Intravenous Every 24 hours 12/19/21 0117 12/19/21 0123   12/19/21 0130  azithromycin (ZITHROMAX) 500 mg in sodium chloride 0.9 % 250 mL IVPB  Status:  Discontinued        500 mg 250 mL/hr over 60 Minutes Intravenous Every 24 hours 12/19/21 0117 12/19/21 0123   12/18/21 1815  cefTRIAXone (ROCEPHIN) 2 g in sodium chloride 0.9 % 100 mL IVPB        2 g 200 mL/hr over 30 Minutes Intravenous Every 24 hours 12/18/21 1813  12/23/21 1814   12/18/21 1815  azithromycin (ZITHROMAX) 500 mg in sodium chloride 0.9 % 250 mL IVPB        500 mg 250 mL/hr over 60 Minutes Intravenous Every 24 hours 12/18/21 1813 12/23/21 1814        Medications  Scheduled Meds:  donepezil  5 mg Oral QHS   DULoxetine  30 mg Oral Daily   enoxaparin (LOVENOX) injection  40 mg Subcutaneous Q24H   LORazepam  1 mg Intravenous Once   memantine  10 mg Oral BID   senna-docusate  1 tablet Oral BID   Continuous Infusions:  azithromycin 500 mg (12/19/21 1906)   cefTRIAXone (ROCEPHIN)  IV 2 g (12/19/21 1820)   PRN Meds:.acetaminophen **OR** acetaminophen, ondansetron **OR** ondansetron (ZOFRAN) IV, polyvinyl alcohol    Subjective:   Robert Le was seen and examined today.  MORE AWAKE today. Not in distress.   Objective:   Vitals:   12/19/21 1710 12/20/21 0042 12/20/21 0548 12/20/21 1128  BP: (!) 112/51  (!) 169/94 (!) 175/89  Pulse: 80 95 74 87  Resp: '18 16 18 19  '$ Temp:  100.3 F (37.9 C)  98 F (36.7 C) 98 F (36.7 C)  TempSrc:   Oral Axillary  SpO2: 96% 94% (!) 78% 92%  Weight:      Height:        Intake/Output Summary (Last 24 hours) at 12/20/2021 1336 Last data filed at 12/19/2021 1742 Gross per 24 hour  Intake --  Output 400 ml  Net -400 ml    Filed Weights   12/18/21 1830  Weight: 78 kg     Exam General exam: Elderly gentleman, not in distress.  Respiratory system: Clear to auscultation. Respiratory effort normal. Cardiovascular system: S1 & S2 heard, RRR. No JVD,  No pedal edema. Gastrointestinal system: Abdomen is nondistended, soft and nontender.  Central nervous system: Alert, confused.  Extremities: Symmetric 5 x 5 power. Skin: No rashes,  Psychiatry:cannot be assessed    Data Reviewed:  I have personally reviewed following labs and imaging studies   CBC Lab Results  Component Value Date   WBC 7.8 12/20/2021   RBC 4.22 12/20/2021   HGB 12.1 (L) 12/20/2021   HCT 35.8 (L) 12/20/2021   MCV 84.8 12/20/2021   MCH 28.7 12/20/2021   PLT 233 12/20/2021   MCHC 33.8 12/20/2021   RDW 13.9 12/20/2021   LYMPHSABS 1.6 12/20/2021   MONOABS 0.7 12/20/2021   EOSABS 0.1 12/20/2021   BASOSABS 0.0 61/60/7371     Last metabolic panel Lab Results  Component Value Date   NA 138 12/20/2021   K 3.8 12/20/2021   CL 106 12/20/2021   CO2 26 12/20/2021   BUN 12 12/20/2021   CREATININE 0.86 12/20/2021   GLUCOSE 89 12/20/2021   GFRNONAA >60 12/20/2021   GFRAA 82 (L) 08/31/2013   CALCIUM 9.0 12/20/2021   PROT 6.2 (L) 12/18/2021   ALBUMIN 3.1 (L) 12/18/2021   BILITOT 0.8 12/18/2021   ALKPHOS 76 12/18/2021   AST 25 12/18/2021   ALT 17 12/18/2021   ANIONGAP 6 12/20/2021    CBG (last 3)  No results for input(s): "GLUCAP" in the last 72 hours.    Coagulation Profile: Recent Labs  Lab 12/18/21 1837  INR 1.2      Radiology Studies: CT Chest Wo Contrast  Result Date: 12/18/2021 CLINICAL DATA:  Pneumonia,  complication suspected, xray done EXAM: CT CHEST WITHOUT CONTRAST TECHNIQUE: Multidetector CT imaging of the chest was performed  following the standard protocol without IV contrast. RADIATION DOSE REDUCTION: This exam was performed according to the departmental dose-optimization program which includes automated exposure control, adjustment of the mA and/or kV according to patient size and/or use of iterative reconstruction technique. COMPARISON:  04/17/2021 chest CT.  Chest x-ray today. FINDINGS: Cardiovascular: Heart is normal size. Scattered coronary artery and aortic calcifications. No aneurysm. Mediastinum/Nodes: No mediastinal, hilar, or axillary adenopathy. Trachea and esophagus are unremarkable. Thyroid unremarkable. Lungs/Pleura: Bilateral lower lobe airspace opacities. No effusions. Upper Abdomen: No acute findings Musculoskeletal: Chest wall soft tissues are unremarkable. No acute bony abnormality. IMPRESSION: Bilateral lower lobe airspace opacities concerning for pneumonia. Coronary artery disease. Aortic Atherosclerosis (ICD10-I70.0). Electronically Signed   By: Rolm Baptise M.D.   On: 12/18/2021 21:24   CT Head Wo Contrast  Result Date: 12/18/2021 CLINICAL DATA:  Mental status change, unknown cause EXAM: CT HEAD WITHOUT CONTRAST TECHNIQUE: Contiguous axial images were obtained from the base of the skull through the vertex without intravenous contrast. RADIATION DOSE REDUCTION: This exam was performed according to the departmental dose-optimization program which includes automated exposure control, adjustment of the mA and/or kV according to patient size and/or use of iterative reconstruction technique. COMPARISON:  Head CT 06/14/2021 FINDINGS: Brain: Stable degree moderate atrophy. Stable degree of chronic small vessel ischemic change. No hemorrhage, acute infarct, hydrocephalus, extra-axial collection or mass lesion/mass effect. Vascular: Atherosclerosis of skullbase vasculature without hyperdense  vessel or abnormal calcification. Skull: No fracture or focal lesion. Sinuses/Orbits: There is diffuse mucosal thickening throughout the ethmoid air cells in the right maxillary sinus. Fluid levels with frothy debris in the right side of sphenoid sinus. Left maxillary sinus mucosal thickening and fluid level. Small amount of air within the right ophthalmic vein and right muscles of mastication likely related to IV catheter placement. Other: None. IMPRESSION: 1. No acute intracranial abnormality. 2. Stable atrophy and chronic small vessel ischemic change. 3. Paranasal sinus disease with fluid levels in the left maxillary and sphenoid sinuses, may be due to acute sinusitis. 4. Small amount of air within the right ophthalmic vein and right muscles of mastication likely related to IV catheter placement. Electronically Signed   By: Keith Rake M.D.   On: 12/18/2021 19:16   DG Chest Port 1 View  Result Date: 12/18/2021 CLINICAL DATA:  Respiratory distress. Questionable sepsis. Patient unconscious and unable to follow breathing instructions for cxr.Questionable sepsis - evaluate for abnormality EXAM: PORTABLE CHEST 1 VIEW COMPARISON:  06/14/2021 FINDINGS: Extremely low lung volumes similar to comparison exam. Mild venous congestion. No pneumothorax. No focal consolidation. IMPRESSION: Extremely low lung volumes and venous congestion. Electronically Signed   By: Suzy Bouchard M.D.   On: 12/18/2021 18:39       Hosie Poisson M.D. [facility] 12/20/2021, 1:36 PM  Available via Epic secure chat 7am-7pm After 7 pm, please refer to night coverage provider listed on amion.

## 2021-12-20 NOTE — Evaluation (Signed)
Physical Therapy Evaluation Patient Details Name: Robert Le MRN: [id number] DOB: 1940/11/18 Today's Date: 12/20/2021  History of Present Illness  Robert Le is a 81 y.o. male with medical history significant for dementia, hypertension, lbrought to the ER due to altered mental status, that was preceded by a week of coughing.  Unable to obtain history due to lethargy and altered mental status with baseline dementia   He was found to have bilateral lower lobe pneumonia.  Clinical Impression  Pt is a pleasant 81 year old male who was admitted for CAP. Pt performs bed mobility with total assist +2 and unable to tolerate further transfers/ambulation at this time. Pt unable to answer questions and is poor historian. Per chart review, pt was ambulatory. Pt demonstrates deficits with cognition/balance/mobility. Would benefit from skilled PT to address above deficits and promote optimal return to PLOF. Will keep on trial basis of therapy while admitted to attempt progress towards goals. Recommend return back to [facility] memory care when medically stable.     Recommendations for follow up therapy are one component of a multi-disciplinary discharge planning process, led by the attending physician.  Recommendations may be updated based on patient status, additional functional criteria and insurance authorization.  Follow Up Recommendations Long-term institutional care without follow-up therapy Can patient physically be transported by private vehicle: No    Assistance Recommended at Discharge Frequent or constant Supervision/Assistance  Patient can return home with the following       Equipment Recommendations None recommended by PT  Recommendations for Other Services       Functional Status Assessment Patient has had a recent decline in their functional status and demonstrates the ability to make significant improvements in function in a reasonable and predictable amount of time.      Precautions / Restrictions Precautions Precautions: Fall Restrictions Weight Bearing Restrictions: No      Mobility  Bed Mobility Overal bed mobility: Needs Assistance Bed Mobility: Supine to Sit     Supine to sit: Total assist, +2 for physical assistance     General bed mobility comments: doesn't follow commands. Takes total assist +2 for all mobility. Pt resistant to sitting at EOB and demonstrates poor post lean. Unable to sit at [facility]    Transfers                   General transfer comment: unable    Ambulation/Gait                  Stairs            Wheelchair Mobility    Modified Rankin (Stroke Patients Only)       Balance                                             Pertinent Vitals/Pain Pain Assessment Pain Assessment: No/denies pain    Home Living Family/patient expects to be discharged to::  (memory care at [facility])                   Additional Comments: [facility]    Prior Function Prior Level of Function : Patient poor historian/Family not available             Mobility Comments: per chart pt mobile at memory care unit using AD however pt unable to answer  questions ADLs Comments: unsure     Hand Dominance        Extremity/Trunk Assessment   Upper Extremity Assessment Upper Extremity Assessment: Generalized weakness (Moves R UE spontaneously, however doesn't move L UE)    Lower Extremity Assessment Lower Extremity Assessment: Generalized weakness;Difficult to assess due to impaired cognition (doesn't follow commands)       Communication      Cognition Arousal/Alertness: Awake/alert Behavior During Therapy: WFL for tasks assessed/performed Overall Cognitive Status: No family/caregiver present to determine baseline cognitive functioning                                 General Comments: pleasant, doesn't follow commands, poor historian         General Comments      Exercises Other Exercises Other Exercises: Nurse student in room feeding breakfast. Took over and pt able to self feed several bites, however then stopped following commands. When asked if he was still hungry he said "why yes!" gave fork to him but unable to feed self   Assessment/Plan    PT Assessment Patient needs continued PT services  PT Problem List Decreased strength;Decreased cognition       PT Treatment Interventions Gait training    PT Goals (Current goals can be found in the Care Plan section)  Acute Rehab PT Goals Patient Stated Goal: unable to state PT Goal Formulation: Patient unable to participate in goal setting Time For Goal Achievement: 01/03/22 Potential to Achieve Goals: Poor    Frequency Min 2X/week     Co-evaluation               AM-PAC PT "6 Clicks" Mobility  Outcome Measure Help needed turning from your back to your side while in a flat bed without using bedrails?: Total Help needed moving from lying on your back to sitting on the side of a flat bed without using bedrails?: Total Help needed moving to and from a bed to a chair (including a wheelchair)?: Total Help needed standing up from a chair using your arms (e.g., wheelchair or bedside chair)?: Total Help needed to walk in hospital room?: Total Help needed climbing 3-5 steps with a railing? : Total 6 Click Score: 6    End of Session   Activity Tolerance:  (limited due to cognition) Patient left: in bed;with bed alarm set Nurse Communication: Mobility status PT Visit Diagnosis: Muscle weakness (generalized) (M62.81)    Time: 4076-8088 PT Time Calculation (min) (ACUTE ONLY): 13 min   Charges:   PT Evaluation $PT Eval Low Complexity: 1 Low          Greggory Stallion, PT, DPT, GCS [phone]   Bushra Denman 12/20/2021, 2:11 PM

## 2021-12-20 NOTE — Consult Note (Signed)
Consultation Note Date: 12/20/2021   Patient Name: Robert Le  DOB: Dec 03, 1940  MRN: [id number]  Age / Sex: 81 y.o., male  PCP: [name], MD Referring Physician: Hosie Poisson, MD  Reason for Consultation: Establishing goals of care  HPI/Patient Profile:  Robert Le is a 81 y.o. male with medical history significant for dementia, hypertension, lbrought to the ER due to altered mental status, that was preceded by a week of coughing.  Unable to obtain history due to lethargy and altered mental status with baseline dementia  He was found to have bilateral lower lobe pneumonia.   Clinical Assessment and Goals of Care:   Notes and labs reviewed.  Into see patient.  Patient was very alert, responsive, and cheerful to me.  No family at bedside.  He is unable to answer any of the questions that I ask him.  He is unable to tell me his name, if he is married, if he has children, or where we are.  He attempts to change the subject, but he is unable to complete sentences.  Spoke with his son Merry Proud who is H POA.  Merry Proud tells me that his father was the president of the [employer].  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  He states he and his father understood each other.  He states that there are 3 children and they are spread across different states.  He states they all grew up in [place].  He states he still lives in [place], and his mother and father moved to [place] 25 years ago.  He discusses that his father lives in [place] memory care.  He discusses his father's previous admissions.   We discussed his diagnoses, prognosis, GOC, EOL wishes disposition and options.  Created space and opportunity for patient  to explore thoughts and feelings regarding current medical information.   A detailed  discussion was had today regarding advanced directives.  Concepts specific to code status, artifical feeding and hydration, IV antibiotics and rehospitalization were discussed.  The difference between an aggressive medical intervention path and a comfort care path was discussed.  Values and goals of care important to patient and family were attempted to be elicited.  Discussed limitations of medical interventions to prolong quality of life in some situations and discussed the concept of human mortality.  He discusses that when his father was still able to carry on conversations, he would swat people away and did not like them providing physical care for him.  He states at baseline now, typically his father knows his name.  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    He states that he has already completed a MOST form for DNR and limited additional interventions.  He states he will speak with his family regarding care moving forward.  SUMMARY OF RECOMMENDATIONS  Son confirms DNR/DNI.  He states he will continue to consider care moving forward.       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 The following aspects are pertinent.  Past Medical History:  Diagnosis Date   Acute cholecystitis    Acute metabolic encephalopathy    Alzheimer disease (HCC)    Anxiety    Aortic atherosclerosis (HCC)    Arthritis    Chronic kidney disease    L TOTAL NEPHRECTOMY   History of DVT (deep vein thrombosis)    Hypertension    Sepsis ([place])    Social History   Socioeconomic History   Marital status: Divorced    Spouse name: Not on file   Number of children: 3   Years of education: Not on file   Highest  education level: Not on file  Occupational History   Occupation: Tax adviser    Comment: Retired  Tobacco Use   Smoking status: Never   Smokeless tobacco: Never  Substance and Sexual Activity   Alcohol use: No   Drug use: No   Sexual activity: Not Currently  Other Topics Concern   Not on file  Social History Narrative   Not sure about advanced directives   Would want sons to make decisions   Would accept resuscitation attempts   Not sure about tube feeds   Social Determinants of Health   Financial Resource Strain: Not on file  Food Insecurity: Not on file  Transportation Needs: Not on file  Physical Activity: Not on file  Stress: Not on file  Social Connections: Not on file   Family History  Problem Relation Age of Onset   Heart disease Father    Arthritis Brother    Arthritis Brother    Cancer Neg Hx    Diabetes Neg Hx    Scheduled Meds:  apixaban  5 mg Oral BID   donepezil  5 mg Oral QHS   DULoxetine  30 mg Oral Daily   LORazepam  1 mg Intravenous Once   memantine  10 mg Oral BID   senna-docusate  1 tablet Oral BID   Continuous Infusions:  azithromycin 500 mg (12/19/21 1906)   cefTRIAXone (ROCEPHIN)  IV 2 g (12/19/21 1820)   PRN Meds:.acetaminophen **OR** acetaminophen, hydrALAZINE, ondansetron **OR** ondansetron (ZOFRAN) IV, polyvinyl alcohol Medications Prior to Admission:  Prior to Admission medications   Medication Sig Start Date End Date Taking? Authorizing Provider  amLODipine (NORVASC) 5 MG tablet Take 5 mg by mouth daily.   Yes [provider]  Cholecalciferol (VITAMIN D3) 1.25 MG (50000 UT) CAPS Take 1 capsule by mouth every morning.   Yes [provider]  donepezil (ARICEPT) 5 MG tablet Take 5 mg by mouth every evening. 02/23/21  Yes [provider]  DULoxetine (CYMBALTA) 30 MG capsule Give Two capsules by mouth in the morning; Give One capsule by mouth in the evening daily at 4pm   Yes [provider]   memantine (NAMENDA) 10 MG tablet Take 10 mg by mouth 2 (two) times daily. 02/23/21  Yes [provider]  Multiple Vitamin (THERA-TABS PO) Take 1 tablet by mouth daily at 6 (six) AM.   Yes [provider]  Polyethylene Glycol 3350 (PEG 3350) 17 g PACK Take 1 packet by mouth every other day.   Yes  [provider]  senna-docusate (SENNA-S) 8.6-50 MG tablet Take 1 tablet by mouth 2 (two) times daily.   Yes [provider]  acetaminophen (TYLENOL) 325 MG tablet Take 650 mg by mouth every 6 (six) hours as needed.    [provider]  carboxymethylcellulose (ARTIFICIAL TEARS) 1 % ophthalmic solution Apply 1 drop to eye 2 (two) times daily as needed.    [provider]  HYDROcodone-acetaminophen (NORCO/VICODIN) 5-325 MG tablet Take 1 tablet by mouth every 4 (four) hours as needed for moderate pain.    [provider]  OXYGEN Inhale 2 L into the lungs as needed (to keep O2 sats > 90%).    [provider]   Allergies  Allergen Reactions   Donepezil Other (See Comments)    Increased confusion Still listed on pt's current med list as of 06/14/2021   Sulfites     Unable to recall reaction or specific sulfa drug   Review of Systems  Unable to perform ROS   Physical Exam Pulmonary:     Effort: Pulmonary effort is normal.  Neurological:     Mental Status: He is alert.     Vital Signs: BP (!) 175/89 (BP Location: Left Leg)   Pulse 87   Temp 98 F (36.7 C) (Axillary)   Resp 19   Ht '5\' 7"'$  (1.702 m)   Wt 78 kg   SpO2 92%   BMI 26.94 kg/m  Pain Scale: PAINAD       SpO2: SpO2: 92 % O2 Device:SpO2: 92 % O2 Flow Rate: .O2 Flow Rate (L/min): 2 L/min  IO: Intake/output summary:  Intake/Output Summary (Last 24 hours) at 12/20/2021 1436 Last data filed at 12/19/2021 1742 Gross per 24 hour  Intake --  Output 400 ml  Net -400 ml    LBM: Last BM Date : 12/19/21 Baseline Weight: Weight: 78 kg Most recent weight: Weight:  78 kg       Signed by: Asencion Gowda, NP   Please contact Palliative Medicine Team phone at [phone] for questions and concerns.  For individual provider: See Shea Evans

## 2021-12-20 NOTE — Plan of Care (Signed)
Goals of care consult noted.  Notes reviewed.  In to see patient.  He was not oriented and was unable to answer any of my questions, changing the subject and unable to complete sentences.  No family at bedside.  We will need to reach out to family.

## 2021-12-21 DIAGNOSIS — G9341 Metabolic encephalopathy: Secondary | ICD-10-CM | POA: Diagnosis not present

## 2021-12-21 DIAGNOSIS — I10 Essential (primary) hypertension: Secondary | ICD-10-CM | POA: Diagnosis not present

## 2021-12-21 DIAGNOSIS — J189 Pneumonia, unspecified organism: Secondary | ICD-10-CM | POA: Diagnosis not present

## 2021-12-21 DIAGNOSIS — G309 Alzheimer's disease, unspecified: Secondary | ICD-10-CM | POA: Diagnosis not present

## 2021-12-21 LAB — LEGIONELLA PNEUMOPHILA SEROGP 1 UR AG: L. pneumophila Serogp 1 Ur Ag: NEGATIVE

## 2021-12-21 LAB — C DIFFICILE QUICK SCREEN W PCR REFLEX
C Diff antigen: POSITIVE — AB
C Diff toxin: NEGATIVE

## 2021-12-21 LAB — CLOSTRIDIUM DIFFICILE BY PCR, REFLEXED: Toxigenic C. Difficile by PCR: POSITIVE — AB

## 2021-12-21 MED ORDER — AMLODIPINE BESYLATE 10 MG PO TABS
10.0000 mg | ORAL_TABLET | Freq: Every day | ORAL | Status: DC
  Administered 2021-12-21 – 2021-12-22 (×2): 10 mg via ORAL
  Filled 2021-12-21 (×2): qty 1

## 2021-12-21 NOTE — Progress Notes (Signed)
Patients BP 184/89 HR 86. Gave PRN Hydralazine.

## 2021-12-21 NOTE — Progress Notes (Signed)
[facility]                                                                               Robert Le, is a 81 y.o. male, DOB - 02-Mar-1940, VPX:[id number] Admit date - 12/18/2021    Outpatient Primary MD for the patient is Robert Shorter, MD  LOS - 2  days    Brief summary   Robert Le is a 81 y.o. male with medical history significant for dementia, hypertension, lbrought to the ER due to altered mental status, that was preceded by a week of coughing.  Unable to obtain history due to lethargy and altered mental status with baseline dementia  He was found to have bilateral lower lobe pneumonia.  He is currently on 2l it of [place] oxygen. Palliative care consulted for goals of care due to repeated admissions for recurrent pneumonia.  He appears to be at baseline mental status. Plan to wean him off oxygen and discharge back to long term residence in 1 to 2 days.    Assessment & Plan    Assessment and Plan: * Community acquired bilateral lower lobe pneumonia Sepsis Sepsis criteria includes fever, tachycardia, leukocytosis with lactic acidosis and altered mental status Continue with broad spectrum IV antibiotics. Currently on 2 lit of [place] oxygen.  [place] oxygen to keep sats greater than 90%.  Pro calcitonin is 1.1, lactic acid normalized. Wbc count normalized.   Negative urine for strep pneumonia and legionella antigen is pending. [place] Kitchen  Sputum cultures if able to obtain.  Plan to wean him off oxygen in the next 24 hours and transition to oral antibiotics on discharge.    Acute metabolic encephalopathy in th setting of dementia Patient lethargic, arouses but readily falls back asleep. Pt still very lethargic.   Suspect secondary to sepsis Aspiration precautions.  SLP eval recommended dysphagia 3 diet .  Restart home meds when able to take by mouth.   Hypertension BP parameters elevated, added norvasc  10 mg daily, prn hydralazine.   History of DVT (deep vein  thrombosis) Chronic anticoagulation Restart the eliquis.   Soft loose BM:  Only one this am. C diff pcr is pending.   Therapy evaluation ordered.  Palliative care consulted for goals of care.         Estimated body mass index is 26.94 kg/m as calculated from the following:   Height as of this encounter: '5\' 7"'$  (1.702 m).   Weight as of this encounter: 78 kg.  Code Status: DNR DVT Prophylaxis:   apixaban (ELIQUIS) tablet 5 mg   Level of Care: Level of care: Telemetry Medical Family Communication: NONE AT BEDSIDE.   Disposition Plan:     Remains inpatient appropriate:  acute metabolic encephalopathy. Pneumonia.    Procedures:  None.   Consultants:   Palliative care.   Antimicrobials:   Anti-infectives (From admission, onward)    Start     Dose/Rate Route Frequency Ordered Stop   12/19/21 0130  cefTRIAXone (ROCEPHIN) 2 g in sodium chloride 0.9 % 100 mL IVPB  Status:  Discontinued        2 g 200 mL/hr over 30 Minutes Intravenous Every 24 hours  12/19/21 0117 12/19/21 0123   12/19/21 0130  azithromycin (ZITHROMAX) 500 mg in sodium chloride 0.9 % 250 mL IVPB  Status:  Discontinued        500 mg 250 mL/hr over 60 Minutes Intravenous Every 24 hours 12/19/21 0117 12/19/21 0123   12/18/21 1815  cefTRIAXone (ROCEPHIN) 2 g in sodium chloride 0.9 % 100 mL IVPB        2 g 200 mL/hr over 30 Minutes Intravenous Every 24 hours 12/18/21 1813 12/23/21 1814   12/18/21 1815  azithromycin (ZITHROMAX) 500 mg in sodium chloride 0.9 % 250 mL IVPB        500 mg 250 mL/hr over 60 Minutes Intravenous Every 24 hours 12/18/21 1813 12/23/21 1814        Medications  Scheduled Meds:  amLODipine  10 mg Oral Daily   apixaban  5 mg Oral BID   donepezil  5 mg Oral QHS   DULoxetine  30 mg Oral Daily   LORazepam  1 mg Intravenous Once   memantine  10 mg Oral BID   senna-docusate  1 tablet Oral BID   Continuous Infusions:  azithromycin Stopped (12/20/21 1935)   cefTRIAXone (ROCEPHIN)  IV  Stopped (12/20/21 1803)   PRN Meds:.acetaminophen **OR** acetaminophen, hydrALAZINE, ondansetron **OR** ondansetron (ZOFRAN) IV, polyvinyl alcohol    Subjective:   [name] was seen and examined today.  Confused, but not agitated. One soft BM earlier this am.   Objective:   Vitals:   12/20/21 2200 12/21/21 0510 12/21/21 0959 12/21/21 1200  BP:  (!) 150/94 (!) 184/89 (!) 188/78  Pulse:  89 86 74  Resp:  16 16   Temp:  98.1 F (36.7 C) (!) 96.6 F (35.9 C)   TempSrc:      SpO2: 95% 94% 98%   Weight:      Height:        Intake/Output Summary (Last 24 hours) at 12/21/2021 1223 Last data filed at 12/21/2021 5170 Gross per 24 hour  Intake 1375.08 ml  Output 850 ml  Net 525.08 ml    Filed Weights   12/18/21 1830  Weight: 78 kg     Exam General exam: Elderly gentleman, pleasantly confused, not in distress.  Respiratory system: Clear to auscultation. Respiratory effort normal. On 2 lit of [place] oxygen.  Cardiovascular system: S1 & S2 heard, RRR. No JVD, No pedal edema. Gastrointestinal system: Abdomen is nondistended, soft and nontender.  Central nervous system: Alert, confused.  Extremities: no pedal edema.  Skin: No rashes,  Psychiatry: Mood & affect appropriate.      Data Reviewed:  I have personally reviewed following labs and imaging studies   CBC Lab Results  Component Value Date   WBC 7.8 12/20/2021   RBC 4.22 12/20/2021   HGB 12.1 (L) 12/20/2021   HCT 35.8 (L) 12/20/2021   MCV 84.8 12/20/2021   MCH 28.7 12/20/2021   PLT 233 12/20/2021   MCHC 33.8 12/20/2021   RDW 13.9 12/20/2021   LYMPHSABS 1.6 12/20/2021   MONOABS 0.7 12/20/2021   EOSABS 0.1 12/20/2021   BASOSABS 0.0 01/74/9449     Last metabolic panel Lab Results  Component Value Date   NA 138 12/20/2021   K 3.8 12/20/2021   CL 106 12/20/2021   CO2 26 12/20/2021   BUN 12 12/20/2021   CREATININE 0.86 12/20/2021   GLUCOSE 89 12/20/2021   GFRNONAA >60 12/20/2021   GFRAA 82 (L)  08/31/2013   CALCIUM 9.0 12/20/2021   PROT  6.2 (L) 12/18/2021   ALBUMIN 3.1 (L) 12/18/2021   BILITOT 0.8 12/18/2021   ALKPHOS 76 12/18/2021   AST 25 12/18/2021   ALT 17 12/18/2021   ANIONGAP 6 12/20/2021    CBG (last 3)  No results for input(s): "GLUCAP" in the last 72 hours.    Coagulation Profile: Recent Labs  Lab 12/18/21 1837  INR 1.2      Radiology Studies: No results found.     Hosie Poisson M.D. [facility] 12/21/2021, 12:23 PM  Available via Epic secure chat 7am-7pm After 7 pm, please refer to night coverage provider listed on amion.

## 2021-12-21 NOTE — Progress Notes (Signed)
Physical Therapy Treatment Patient Details Name: Robert Le MRN: [id number] DOB: 10-12-1940 Today's Date: 12/21/2021   History of Present Illness Robert Le is a 81 y.o. male with medical history significant for dementia, hypertension, lbrought to the ER due to altered mental status, that was preceded by a week of coughing.  Unable to obtain history due to lethargy and altered mental status with baseline dementia   He was found to have bilateral lower lobe pneumonia.    PT Comments    Pt awake and attempted mobility.  He resists multiple attempts to get him to EOB during session.  BLE ex but allows very minimal ROM and stiffens legs resisting motion.  Pt does seem to have significant strength for activity but cognition seems to be primary barrier.   Recommendations for follow up therapy are one component of a multi-disciplinary discharge planning process, led by the attending physician.  Recommendations may be updated based on patient status, additional functional criteria and insurance authorization.  Follow Up Recommendations  Long-term institutional care without follow-up therapy Can patient physically be transported by private vehicle: No   Assistance Recommended at Discharge Frequent or constant Supervision/Assistance  Patient can return home with the following     Equipment Recommendations  None recommended by PT    Recommendations for Other Services       Precautions / Restrictions       Mobility  Bed Mobility Overal bed mobility: Needs Assistance Bed Mobility: Supine to Sit, Sit to Supine     Supine to sit: Total assist Sit to supine: Total assist   General bed mobility comments: resists any attempts and stiffen legs    Transfers                   General transfer comment: resists    Ambulation/Gait                   Stairs             Wheelchair Mobility    Modified Rankin (Stroke Patients Only)       Balance                                             Cognition Arousal/Alertness: Awake/alert Behavior During Therapy: WFL for tasks assessed/performed Overall Cognitive Status: No family/caregiver present to determine baseline cognitive functioning                                 General Comments: pleasant, doesn't follow commands        Exercises Other Exercises Other Exercises: BLE attempts at AA/PROM but resists all motions despite varied attempts    General Comments        Pertinent Vitals/Pain Pain Assessment Pain Assessment: No/denies pain    Home Living                          Prior Function            PT Goals (current goals can now be found in the care plan section) Progress towards PT goals: Not progressing toward goals - comment    Frequency    Min 2X/week      PT Plan      Co-evaluation  AM-PAC PT "6 Clicks" Mobility   Outcome Measure  Help needed turning from your back to your side while in a flat bed without using bedrails?: Total Help needed moving from lying on your back to sitting on the side of a flat bed without using bedrails?: Total Help needed moving to and from a bed to a chair (including a wheelchair)?: Total Help needed standing up from a chair using your arms (e.g., wheelchair or bedside chair)?: Total Help needed to walk in hospital room?: Total Help needed climbing 3-5 steps with a railing? : Total 6 Click Score: 6    End of Session   Activity Tolerance:  (limited due to cognition) Patient left: in bed;with bed alarm set Nurse Communication: Mobility status PT Visit Diagnosis: Muscle weakness (generalized) (M62.81)     Time: 0208-0216 PT Time Calculation (min) (ACUTE ONLY): 8 min  Charges:  $Therapeutic Exercise: 8-22 mins                    Chesley Noon, PTA 12/21/21, 2:20 PM

## 2021-12-21 NOTE — Plan of Care (Signed)
Patient is currently resting in bed with eyes closed. No family at bedside. PMT will continue to follow for needs.

## 2021-12-21 NOTE — Plan of Care (Signed)

## 2021-12-22 ENCOUNTER — Telehealth (SKILLED_NURSING_FACILITY): Payer: Medicare Other | Admitting: Student

## 2021-12-22 DIAGNOSIS — F03C0 Unspecified dementia, severe, without behavioral disturbance, psychotic disturbance, mood disturbance, and anxiety: Secondary | ICD-10-CM

## 2021-12-22 DIAGNOSIS — J189 Pneumonia, unspecified organism: Secondary | ICD-10-CM | POA: Diagnosis not present

## 2021-12-22 LAB — CBC WITH DIFFERENTIAL/PLATELET
Abs Immature Granulocytes: 0.04 10*3/uL (ref 0.00–0.07)
Basophils Absolute: 0 10*3/uL (ref 0.0–0.1)
Basophils Relative: 0 %
Eosinophils Absolute: 0.3 10*3/uL (ref 0.0–0.5)
Eosinophils Relative: 4 %
HCT: 37.9 % — ABNORMAL LOW (ref 39.0–52.0)
Hemoglobin: 12.8 g/dL — ABNORMAL LOW (ref 13.0–17.0)
Immature Granulocytes: 0 %
Lymphocytes Relative: 28 %
Lymphs Abs: 2.6 10*3/uL (ref 0.7–4.0)
MCH: 28.8 pg (ref 26.0–34.0)
MCHC: 33.8 g/dL (ref 30.0–36.0)
MCV: 85.2 fL (ref 80.0–100.0)
Monocytes Absolute: 0.9 10*3/uL (ref 0.1–1.0)
Monocytes Relative: 10 %
Neutro Abs: 5.4 10*3/uL (ref 1.7–7.7)
Neutrophils Relative %: 58 %
Platelets: 278 10*3/uL (ref 150–400)
RBC: 4.45 MIL/uL (ref 4.22–5.81)
RDW: 13.8 % (ref 11.5–15.5)
WBC: 9.3 10*3/uL (ref 4.0–10.5)
nRBC: 0 % (ref 0.0–0.2)

## 2021-12-22 LAB — BASIC METABOLIC PANEL
Anion gap: 4 — ABNORMAL LOW (ref 5–15)
BUN: 14 mg/dL (ref 8–23)
CO2: 29 mmol/L (ref 22–32)
Calcium: 8.9 mg/dL (ref 8.9–10.3)
Chloride: 106 mmol/L (ref 98–111)
Creatinine, Ser: 0.97 mg/dL (ref 0.61–1.24)
GFR, Estimated: >60 mL/min (ref >60)
Glucose, Bld: 97 mg/dL (ref 70–99)
Potassium: 3.2 mmol/L — ABNORMAL LOW (ref 3.5–5.1)
Sodium: 139 mmol/L (ref 135–145)

## 2021-12-22 MED ORDER — ENSURE ENLIVE PO LIQD: 237.0000 mL | Freq: Two times a day (BID) | ORAL | 12 refills | Status: DC

## 2021-12-22 MED ORDER — SACCHAROMYCES BOULARDII 250 MG PO CAPS: 250.0000 mg | ORAL_CAPSULE | Freq: Two times a day (BID) | ORAL | 0 refills | Status: DC

## 2021-12-22 MED ORDER — POTASSIUM CHLORIDE 20 MEQ PO PACK
40.0000 meq | PACK | Freq: Once | ORAL | Status: AC
  Administered 2021-12-22: 40 meq via ORAL
  Filled 2021-12-22: qty 2

## 2021-12-22 MED ORDER — SACCHAROMYCES BOULARDII 250 MG PO CAPS
250.0000 mg | ORAL_CAPSULE | Freq: Two times a day (BID) | ORAL | Status: DC
  Administered 2021-12-22: 250 mg via ORAL
  Filled 2021-12-22: qty 1

## 2021-12-22 MED ORDER — ENSURE ENLIVE PO LIQD
237.0000 mL | Freq: Two times a day (BID) | ORAL | Status: DC
  Administered 2021-12-22: 237 mL via ORAL

## 2021-12-22 NOTE — NC FL2 (Signed)
[place] LEVEL OF CARE SCREENING TOOL     IDENTIFICATION  Patient Name: Robert Le Birthdate: December 23, 1940 Sex: male Admission Date (Current Location): 12/18/2021  [facility] and [place] Number:  Engineering geologist and Address:  [facility], [address], [place], [place] [address]      Provider Number: [id number]  Attending Physician Name and Address:  Florencia Reasons, MD  Relative Name and Phone Number:  Keane Scrape, son, [phone]    Current Level of Care: Hospital Recommended Level of Care: Memory Care [facility]) Prior Approval Number:    Date Approved/Denied:   PASRR Number:    Discharge Plan: Other (Comment) (Memory Care)    Current Diagnoses: Patient Active Problem List   Diagnosis Date Noted   Chronic anticoagulation 12/18/2021   Community acquired bilateral lower lobe pneumonia 12/18/2021   Hypertension 12/18/2021   History of DVT (deep vein thrombosis) 06/15/2021   Acute cholecystitis 04/17/2021   Sepsis ([place]) 73/53/2992   Acute metabolic encephalopathy 42/68/3419   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   Alzheimer's dementia without behavioral disturbance ([place]) 12/21/2015   History of hypertension 04/21/2015   Insomnia 04/21/2015    Orientation RESPIRATION BLADDER Height & Weight            Weight: 172 lb (78 kg) Height:  '5\' 7"'$  (170.2 cm)  BEHAVIORAL SYMPTOMS/MOOD NEUROLOGICAL BOWEL NUTRITION STATUS  [place]        AMBULATORY STATUS COMMUNICATION OF NEEDS Skin   Limited Assist                           Personal Care Assistance Level of Assistance  Bathing, Feeding, Dressing Bathing Assistance: Limited assistance Feeding assistance: Limited assistance Dressing Assistance: Limited assistance     Functional Limitations Info             SPECIAL CARE FACTORS FREQUENCY                       Contractures      Additional Factors Info                  Current  Medications (12/22/2021):  This is the current hospital active medication list Current Facility-Administered Medications  Medication Dose Route Frequency Provider Last Rate Last Admin   acetaminophen (TYLENOL) tablet 650 mg  650 mg Oral Q6H PRN Athena Masse, MD       Or   acetaminophen (TYLENOL) suppository 650 mg  650 mg Rectal Q6H PRN Athena Masse, MD       amLODipine (NORVASC) tablet 10 mg  10 mg Oral Daily Hosie Poisson, MD   10 mg at 12/22/21 0940   apixaban (ELIQUIS) tablet 5 mg  5 mg Oral BID Rito Ehrlich A, RPH   5 mg at 12/22/21 0940   azithromycin (ZITHROMAX) 500 mg in sodium chloride 0.9 % 250 mL IVPB  500 mg Intravenous Q24H Nena Polio, MD   Stopped at 12/21/21 1934   cefTRIAXone (ROCEPHIN) 2 g in sodium chloride 0.9 % 100 mL IVPB  2 g Intravenous Q24H Nena Polio, MD 200 mL/hr at 12/22/21 1421 2 g at 12/22/21 1421   donepezil (ARICEPT) tablet 5 mg  5 mg Oral QHS Hosie Poisson, MD   5 mg at 12/21/21 2132   DULoxetine (CYMBALTA) DR capsule 30 mg  30 mg Oral Daily Hosie Poisson, MD   30 mg at 12/22/21 0940  feeding supplement (ENSURE ENLIVE / ENSURE PLUS) liquid 237 mL  237 mL Oral BID BM Florencia Reasons, MD   237 mL at 12/22/21 1339   hydrALAZINE (APRESOLINE) tablet 25 mg  25 mg Oral Q8H PRN Hosie Poisson, MD   25 mg at 12/21/21 1835   LORazepam (ATIVAN) injection 1 mg  1 mg Intravenous Once Nena Polio, MD       memantine [facility]) tablet 10 mg  10 mg Oral BID Hosie Poisson, MD   10 mg at 12/22/21 0940   ondansetron (ZOFRAN) tablet 4 mg  4 mg Oral Q6H PRN Athena Masse, MD       Or   ondansetron [facility]) injection 4 mg  4 mg Intravenous Q6H PRN Athena Masse, MD       polyvinyl alcohol (LIQUIFILM TEARS) 1.4 % ophthalmic solution 1 drop  1 drop Both Eyes BID PRN Hosie Poisson, MD       saccharomyces boulardii (FLORASTOR) capsule 250 mg  250 mg Oral BID Florencia Reasons, MD   250 mg at 12/22/21 1339     Discharge Medications: Please see discharge summary for a list of  discharge medications.  Relevant Imaging Results:  Relevant Lab Results:   Additional Information    Liliana Dang D Jasan Doughtie, LCSW

## 2021-12-22 NOTE — Care Management Important Message (Signed)
Important Message  Patient Details  Name: Robert Le MRN: [id number] Date of Birth: 21-Sep-1940   Medicare Important Message Given:  Yes     [name] 12/22/2021, 3:01 PM

## 2021-12-22 NOTE — TOC Transition Note (Signed)
Transition of Care [facility]) - CM/SW Discharge Note   Patient Details  Name: Robert Le MRN: [id number] Date of Birth: [birth year]  Transition of Care [facility]) CM/SW Contact:  Quin Hoop, [place] Phone Number: 12/22/2021, 3:30 PM   Clinical Narrative:     Patient will DC to: [place] date: 12/22/21 Family notified: yes, Merry Proud Transport by: Johnanna Schneiders  Per MD patient ready for DC to . RN, patient, patient's family, and facility notified of DC. Discharge Summary sent to facility. RN given number for report. DC packet on chart. Ambulance transport requested for patient.  CSW signing off.  Ocie Bob, [place]   Final next level of care: Memory Care Barriers to Discharge: No Barriers Identified   Patient Goals and CMS Choice Patient states their goals for this hospitalization and ongoing recovery are:: return to long term care      Discharge Placement                  Name of family member notified: Merry Proud Patient and family notified of of transfer: 12/22/21  Discharge Plan and Services                                     Social Determinants of Health (SDOH) Interventions     Readmission Risk Interventions     No data to display

## 2021-12-22 NOTE — NC FL2 (Deleted)
[place] LEVEL OF CARE SCREENING TOOL     IDENTIFICATION  Patient Name: Robert Le Birthdate: [birth year] Sex: male Admission Date (Current Location): 12/18/2021  [facility] and [place] Number:  Engineering geologist and Address:  [facility], [address], [place], [place] [address]      Provider Number: [id number]  Attending Physician Name and Address:  Florencia Reasons, MD  Relative Name and Phone Number:  Keane Scrape, son, [phone]    Current Level of Care: Hospital Recommended Level of Care: Memory Care [facility]) Prior Approval Number:    Date Approved/Denied:   PASRR Number:    Discharge Plan: Other (Comment) (Memory Care)    Current Diagnoses: Patient Active Problem List   Diagnosis Date Noted   Chronic anticoagulation 12/18/2021   Community acquired bilateral lower lobe pneumonia 12/18/2021   Hypertension 12/18/2021   History of DVT (deep vein thrombosis) 06/15/2021   Acute cholecystitis 04/17/2021   Sepsis ([place]) 17/51/0258   Acute metabolic encephalopathy 52/77/8242   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   Alzheimer's dementia without behavioral disturbance ([place]) 12/21/2015   History of hypertension 04/21/2015   Insomnia 04/21/2015    Orientation RESPIRATION BLADDER Height & Weight            Weight: 172 lb (78 kg) Height:  '5\' 7"'$  (170.2 cm)  BEHAVIORAL SYMPTOMS/MOOD NEUROLOGICAL BOWEL NUTRITION STATUS  [place]        AMBULATORY STATUS COMMUNICATION OF NEEDS Skin   Limited Assist                           Personal Care Assistance Level of Assistance  Bathing, Feeding, Dressing Bathing Assistance: Limited assistance Feeding assistance: Limited assistance Dressing Assistance: Limited assistance     Functional Limitations Info             SPECIAL CARE FACTORS FREQUENCY                       Contractures      Additional Factors Info                  Current  Medications (12/22/2021):  This is the current hospital active medication list Current Facility-Administered Medications  Medication Dose Route Frequency Provider Last Rate Last Admin   acetaminophen (TYLENOL) tablet 650 mg  650 mg Oral Q6H PRN Athena Masse, MD       Or   acetaminophen (TYLENOL) suppository 650 mg  650 mg Rectal Q6H PRN Athena Masse, MD       amLODipine (NORVASC) tablet 10 mg  10 mg Oral Daily Hosie Poisson, MD   10 mg at 12/22/21 0940   apixaban (ELIQUIS) tablet 5 mg  5 mg Oral BID Rito Ehrlich A, RPH   5 mg at 12/22/21 0940   azithromycin (ZITHROMAX) 500 mg in sodium chloride 0.9 % 250 mL IVPB  500 mg Intravenous Q24H Nena Polio, MD   Stopped at 12/21/21 1934   cefTRIAXone (ROCEPHIN) 2 g in sodium chloride 0.9 % 100 mL IVPB  2 g Intravenous Q24H Nena Polio, MD 200 mL/hr at 12/22/21 1421 2 g at 12/22/21 1421   donepezil (ARICEPT) tablet 5 mg  5 mg Oral QHS Hosie Poisson, MD   5 mg at 12/21/21 2132   DULoxetine (CYMBALTA) DR capsule 30 mg  30 mg Oral Daily Hosie Poisson, MD   30 mg at 12/22/21 0940  feeding supplement (ENSURE ENLIVE / ENSURE PLUS) liquid 237 mL  237 mL Oral BID BM Florencia Reasons, MD   237 mL at 12/22/21 1339   hydrALAZINE (APRESOLINE) tablet 25 mg  25 mg Oral Q8H PRN Hosie Poisson, MD   25 mg at 12/21/21 1835   LORazepam (ATIVAN) injection 1 mg  1 mg Intravenous Once Nena Polio, MD       memantine [facility]) tablet 10 mg  10 mg Oral BID Hosie Poisson, MD   10 mg at 12/22/21 0940   ondansetron (ZOFRAN) tablet 4 mg  4 mg Oral Q6H PRN Athena Masse, MD       Or   ondansetron [facility]) injection 4 mg  4 mg Intravenous Q6H PRN Athena Masse, MD       polyvinyl alcohol (LIQUIFILM TEARS) 1.4 % ophthalmic solution 1 drop  1 drop Both Eyes BID PRN Hosie Poisson, MD       saccharomyces boulardii (FLORASTOR) capsule 250 mg  250 mg Oral BID Florencia Reasons, MD   250 mg at 12/22/21 1339     Discharge Medications: Please see discharge summary for a list of  discharge medications.  Relevant Imaging Results:  Relevant Lab Results:   Additional Information    Krystyna Cleckley D Tiauna Whisnant, LCSW

## 2021-12-22 NOTE — Discharge Summary (Signed)
Discharge Summary  Robert Le:[id number] DOB: 08/07/1940  PCP: [name], MD  Admit date: 12/18/2021 Discharge date: 12/22/2021   Time spent: 81mns, more than 50% time spent on coordination of care. Son updated over the phone  Recommendations for Outpatient Follow-up:  F/u with facility PCP within a week  for hospital discharge follow up, repeat cbc/bmp at follow up.  Speech therapist to follow , continue aspiration precaution Palliative care to follow at [facility]    Discharge Diagnoses:  Active Hospital Problems   Diagnosis Date Noted   Community acquired bilateral lower lobe pneumonia 12/18/2021    Priority: High   [place]    Priority: High   [facility]'s [facility] without behavioral disturbance [facility] 12/21/2015    Priority: Low   Acute metabolic encephalopathy [id number]   Priority: 4.   Chronic anticoagulation 12/18/2021   Hypertension 12/18/2021   History of DVT (deep vein thrombosis) 06/15/2021    Resolved Hospital Problems  No resolved problems to display.    Discharge Condition: stable  Diet recommendation: continue aspiration precaution   Diet recommendations: Dysphagia 3 (mechanical soft);Thin liquid Liquids provided via: Cup;Straw Medication Administration: Crushed with puree Supervision: Full supervision/cueing for compensatory strategies;Staff to assist with self feeding Compensations: Minimize environmental distractions;Slow rate;Small sips/bites;Lingual sweep for clearance of pocketing;Follow solids with liquid Postural Changes and/or Swallow Maneuvers: Out of bed for meals;Seated upright 90 degrees;Upright 30-60 min after meal  Filed Weights   12/18/21 1830  Weight: 78 kg    History of present illness: ( per admitting MD Dr DDamita Dunnings SCrisoforo Oxfordis a 81y.o. male with medical history significant for dementia, hypertension, lbrought to the ER due to altered mental status, that was preceded by a week of  coughing.  Unable to obtain history due to lethargy and altered mental status with baseline dementia ED course and data review: Tmax 99.8 with pulse 110, BP 159/91, respirations 20-24 with O2 sat 92% on room air for which she was placed on O2 at 2 L.  VBG was within normal limits Labs otherwise significant for WBC of 17,000 and lactic acid 3.4.  Troponin and BNP WNL.  CMP unremarkable. EKG nonacute Imaging: CT head nonacute but showing sinusitis and CT chest showing Bilateral lower lobe airspace opacities concerning for pneumonia. Head CT IMPRESSION: 1. No acute intracranial abnormality. 2. Stable atrophy and chronic small vessel ischemic change. 3. Paranasal sinus disease with fluid levels in the left maxillary and sphenoid sinuses, may be due to acute sinusitis. 4. Small amount of air within the right ophthalmic vein and right muscles of mastication likely related to IV catheter placement.   Chest CT IMPRESSION: Bilateral lower lobe airspace opacities concerning for pneumonia.     Patient was started on sepsis fluids, Rocephin and azithromycin and hospitalist consulted for admission.     Hospital Course:  Principal Problem:   Community acquired bilateral lower lobe pneumonia Active Problems:   Sepsis ([facility]   Alzheimer's dementia without behavioral disturbance ([facility]   Acute metabolic encephalopathy   History of DVT (deep vein thrombosis)   Chronic anticoagulation   Hypertension   Assessment and Plan:   * Community acquired bilateral lower lobe pneumonia/vs recurrent aspiration pneumonia /Sepsis presents on admission -Sepsis criteria includes fever, tachycardia, leukocytosis with lactic acidosis and altered mental status -he has improved, he is pleasantly confused , he is no on room air, he finished abxx5 days in the hospital, he is seen by speech therapist who recommended dysphagia 3  diet, detail please see above -continue aspiration precaution, benefit from palliative care  following at the facility   Acute metabolic encephalopathy Patient on presentation was  lethargic, arouses but readily falls back asleep He has improved, mental status back to baseline   Hypokalemia Replaced   Hypertension Blood pressure stable on home medication Norvasc  History of LLE  DVT (deep vein thrombosis), diagnosed in 03/2021 Chronic anticoagulation  Eliquis held initially  as unsafe to swallow  Has resumed since 11/20   Dementia/FTT Has been wheelchair bound since 07/2021 Seen by palliative care here, will benefit continue palliative care at [facility]    Discharge Exam: BP (!) 155/100 (BP Location: Left Leg)   Pulse 91   Temp 97.9 F (36.6 C)   Resp 16   Ht '5\' 7"'$  (1.702 m)   Wt 78 kg   SpO2 97%   BMI 26.94 kg/m   General: Pleasantly confused Cardiovascular: RRR Respiratory: Normal respite effort    Discharge Instructions     Diet general   Complete by: As directed    Dysphagia diet 3, aspiration precaution Speech therapist to follow patient at the facility   Increase activity slowly   Complete by: As directed       Allergies as of 12/22/2021       Reactions   Donepezil Other (See Comments)   Increased confusion Still listed on pt's current med list as of 06/14/2021   Sulfites    Unable to recall reaction or specific sulfa drug        Medication List     STOP taking these medications    HYDROcodone-acetaminophen 5-325 MG tablet Commonly known as: NORCO/VICODIN   Vitamin D3 1.25 MG (50000 UT) Caps       TAKE these medications    acetaminophen 325 MG tablet Commonly known as: TYLENOL Take 650 mg by mouth every 6 (six) hours as needed.   amLODipine 5 MG tablet Commonly known as: NORVASC Take 5 mg by mouth daily.   apixaban 5 MG Tabs tablet Commonly known as: ELIQUIS Take 5 mg by mouth 2 (two) times daily.   Artificial Tears 1 % ophthalmic solution Generic drug: carboxymethylcellulose Apply 1 drop to eye 2 (two) times  daily as needed.   donepezil 5 MG tablet Commonly known as: ARICEPT Take 5 mg by mouth every evening.   DULoxetine 30 MG capsule Commonly known as: CYMBALTA Give Two capsules by mouth in the morning; Give One capsule by mouth in the evening daily at 4pm   feeding supplement Liqd Take 237 mLs by mouth 2 (two) times daily between meals. Start taking on: December 23, 2021   memantine 10 MG tablet Commonly known as: NAMENDA Take 10 mg by mouth 2 (two) times daily.   OXYGEN Inhale 2 L into the lungs as needed (to keep O2 sats > 90%).   PEG 3350 17 g Pack Take 1 packet by mouth every other day.   saccharomyces boulardii 250 MG capsule Commonly known as: FLORASTOR Take 1 capsule (250 mg total) by mouth 2 (two) times daily.   Senna-S 8.6-50 MG tablet Generic drug: senna-docusate Take 1 tablet by mouth 2 (two) times daily.   THERA-TABS PO Take 1 tablet by mouth daily at 6 (six) AM.       Allergies  Allergen Reactions   Donepezil Other (See Comments)    Increased confusion Still listed on pt's current med list as of 06/14/2021   Sulfites     Unable  to recall reaction or specific sulfa drug      The results of significant diagnostics from this hospitalization (including imaging, microbiology, ancillary and laboratory) are listed below for reference.    Significant Diagnostic Studies: CT Chest Wo Contrast  Result Date: 12/18/2021 CLINICAL DATA:  Pneumonia, complication suspected, xray done EXAM: CT CHEST WITHOUT CONTRAST TECHNIQUE: Multidetector CT imaging of the chest was performed following the standard protocol without IV contrast. RADIATION DOSE REDUCTION: This exam was performed according to the departmental dose-optimization program which includes automated exposure control, adjustment of the mA and/or kV according to patient size and/or use of iterative reconstruction technique. COMPARISON:  04/17/2021 chest CT.  Chest x-ray today. FINDINGS: Cardiovascular: Heart is  normal size. Scattered coronary artery and aortic calcifications. No aneurysm. Mediastinum/Nodes: No mediastinal, hilar, or axillary adenopathy. Trachea and esophagus are unremarkable. Thyroid unremarkable. Lungs/Pleura: Bilateral lower lobe airspace opacities. No effusions. Upper Abdomen: No acute findings Musculoskeletal: Chest wall soft tissues are unremarkable. No acute bony abnormality. IMPRESSION: Bilateral lower lobe airspace opacities concerning for pneumonia. Coronary artery disease. Aortic Atherosclerosis (ICD10-I70.0). Electronically Signed   By: Rolm Baptise M.D.   On: 12/18/2021 21:24   CT Head Wo Contrast  Result Date: 12/18/2021 CLINICAL DATA:  Mental status change, unknown cause EXAM: CT HEAD WITHOUT CONTRAST TECHNIQUE: Contiguous axial images were obtained from the base of the skull through the vertex without intravenous contrast. RADIATION DOSE REDUCTION: This exam was performed according to the departmental dose-optimization program which includes automated exposure control, adjustment of the mA and/or kV according to patient size and/or use of iterative reconstruction technique. COMPARISON:  Head CT 06/14/2021 FINDINGS: Brain: Stable degree moderate atrophy. Stable degree of chronic small vessel ischemic change. No hemorrhage, acute infarct, hydrocephalus, extra-axial collection or mass lesion/mass effect. Vascular: Atherosclerosis of skullbase vasculature without hyperdense vessel or abnormal calcification. Skull: No fracture or focal lesion. Sinuses/Orbits: There is diffuse mucosal thickening throughout the ethmoid air cells in the right maxillary sinus. Fluid levels with frothy debris in the right side of sphenoid sinus. Left maxillary sinus mucosal thickening and fluid level. Small amount of air within the right ophthalmic vein and right muscles of mastication likely related to IV catheter placement. Other: None. IMPRESSION: 1. No acute intracranial abnormality. 2. Stable atrophy and  chronic small vessel ischemic change. 3. Paranasal sinus disease with fluid levels in the left maxillary and sphenoid sinuses, may be due to acute sinusitis. 4. Small amount of air within the right ophthalmic vein and right muscles of mastication likely related to IV catheter placement. Electronically Signed   By: Keith Rake M.D.   On: 12/18/2021 19:16   DG Chest Port 1 View  Result Date: 12/18/2021 CLINICAL DATA:  Respiratory distress. Questionable sepsis. Patient unconscious and unable to follow breathing instructions for cxr.Questionable sepsis - evaluate for abnormality EXAM: PORTABLE CHEST 1 VIEW COMPARISON:  06/14/2021 FINDINGS: Extremely low lung volumes similar to comparison exam. Mild venous congestion. No pneumothorax. No focal consolidation. IMPRESSION: Extremely low lung volumes and venous congestion. Electronically Signed   By: Suzy Bouchard M.D.   On: 12/18/2021 18:39    Microbiology: Recent Results (from the past 240 hour(s))  Resp Panel by RT-PCR (Flu A&B, Covid) Anterior Nasal Swab     Status: None   Collection Time: 12/18/21  7:07 PM   Specimen: Anterior Nasal Swab  Result Value Ref Range Status   SARS Coronavirus 2 by RT PCR NEGATIVE NEGATIVE Final    Comment: (NOTE) SARS-CoV-2 target nucleic acids are NOT  DETECTED.  The SARS-CoV-2 RNA is generally detectable in upper respiratory specimens during the acute phase of infection. The lowest concentration of SARS-CoV-2 viral copies this assay can detect is 138 copies/mL. A negative result does not preclude SARS-Cov-2 infection and should not be used as the sole basis for treatment or other patient management decisions. A negative result may occur with  improper specimen collection/handling, submission of specimen other than nasopharyngeal swab, presence of viral mutation(s) within the areas targeted by this assay, and inadequate number of viral copies(<138 copies/mL). A negative result must be combined with clinical  observations, patient history, and epidemiological information. The expected result is Negative.  Fact Sheet for Patients:  EntrepreneurPulse.com.au  Fact Sheet for Healthcare Providers:  IncredibleEmployment.be  This test is no t yet approved or cleared by the [place] FDA and  has been authorized for detection and/or diagnosis of SARS-CoV-2 by FDA under an Emergency Use Authorization (EUA). This EUA will remain  in effect (meaning this test can be used) for the duration of the COVID-19 declaration under Section 564(b)(1) of the Act, 21 U.S.C.section 360bbb-3(b)(1), unless the authorization is terminated  or revoked sooner.       Influenza A by PCR NEGATIVE NEGATIVE Final   Influenza B by PCR NEGATIVE NEGATIVE Final    Comment: (NOTE) The Xpert Xpress SARS-CoV-2/FLU/RSV plus assay is intended as an aid in the diagnosis of influenza from Nasopharyngeal swab specimens and should not be used as a sole basis for treatment. Nasal washings and aspirates are unacceptable for Xpert Xpress SARS-CoV-2/FLU/RSV testing.  Fact Sheet for Patients: EntrepreneurPulse.com.au  Fact Sheet for Healthcare Providers: IncredibleEmployment.be  This test is not yet approved or cleared by the [place] FDA and has been authorized for detection and/or diagnosis of SARS-CoV-2 by FDA under an Emergency Use Authorization (EUA). This EUA will remain in effect (meaning this test can be used) for the duration of the COVID-19 declaration under Section 564(b)(1) of the Act, 21 U.S.C. section 360bbb-3(b)(1), unless the authorization is terminated or revoked.  Performed at [facility], [address] [place], [place] [address]   Blood Culture (routine x 2)     Status: None (Preliminary result)   Collection Time: 12/18/21  7:07 PM   Specimen: BLOOD  Result Value Ref Range Status   Specimen Description BLOOD BLOOD  RIGHT ARM  Final   Special Requests   Final    BOTTLES DRAWN AEROBIC AND ANAEROBIC Blood Culture adequate volume   Culture   Final    NO GROWTH 4 DAYS Performed at [facility], [address] [place], [place] [address]    Report Status PENDING  Incomplete  Blood Culture (routine x 2)     Status: None (Preliminary result)   Collection Time: 12/18/21  7:07 PM   Specimen: BLOOD  Result Value Ref Range Status   Specimen Description BLOOD BLOOD RIGHT HAND  Final   Special Requests   Final    BOTTLES DRAWN AEROBIC AND ANAEROBIC Blood Culture adequate volume   Culture   Final    NO GROWTH 4 DAYS Performed at [facility], [address] [place], [place] [address]    Report Status PENDING  Incomplete  Urine Culture     Status: None   Collection Time: 12/18/21  7:07 PM   Specimen: In/Out Cath Urine  Result Value Ref Range Status   Specimen Description   Final    IN/OUT CATH URINE Performed at [facility], [place]  [address] [place], [place] [address]    Special Requests   Final    NONE Performed at [facility], [address] [place], [place] [address]    Culture   Final    NO GROWTH Performed at [facility], [place] [address], [place], [place] [address]    Report Status 12/19/2021 FINAL  Final  MRSA Next Gen by PCR, Nasal     Status: None   Collection Time: 12/19/21  2:32 AM   Specimen: Nasal Mucosa; Nasal Swab  Result Value Ref Range Status   MRSA by PCR Next Gen NOT DETECTED NOT DETECTED Final    Comment: (NOTE) The GeneXpert MRSA Assay (FDA approved for NASAL specimens only), is one component of a comprehensive MRSA colonization surveillance program. It is not intended to diagnose MRSA infection nor to guide or monitor treatment for MRSA infections. Test performance is not FDA approved in patients less than 64 years old. Performed at [facility], [address] [place], [place] [address]   C Difficile Quick  Screen w PCR reflex     Status: Abnormal   Collection Time: 12/20/21  7:49 AM   Specimen: STOOL  Result Value Ref Range Status   C Diff antigen POSITIVE (A) NEGATIVE Final   C Diff toxin NEGATIVE NEGATIVE Final   C Diff interpretation Results are indeterminate. See PCR results.  Final    Comment: Performed at [facility], [address] [place], [place] [address]  C. Diff by PCR, Reflexed     Status: Abnormal   Collection Time: 12/20/21  7:49 AM  Result Value Ref Range Status   Toxigenic C. Difficile by PCR POSITIVE (A) NEGATIVE Final    Comment: Positive for toxigenic C. difficile with little to no toxin production. Only treat if clinical presentation suggests symptomatic illness. Performed at [facility], [address] [place], [place] [address]      Labs: Basic Metabolic Panel: Recent Labs  Lab 12/18/21 1837 12/19/21 0333 12/20/21 1047 12/22/21 0318  NA 137 136 138 139  K 3.5 3.7 3.8 3.2*  CL 106 106 106 106  CO2 '23 25 26 29  '$ GLUCOSE 131* 114* 89 97  BUN '21 17 12 14  '$ CREATININE 1.15 1.09 0.86 0.97  CALCIUM 8.1* 8.1* 9.0 8.9   Liver Function Tests: Recent Labs  Lab 12/18/21 1837  AST 25  ALT 17  ALKPHOS 76  BILITOT 0.8  PROT 6.2*  ALBUMIN 3.1*   No results for input(s): "LIPASE", "AMYLASE" in the last 168 hours. No results for input(s): "AMMONIA" in the last 168 hours. CBC: Recent Labs  Lab 12/18/21 1837 12/19/21 0333 12/20/21 1047 12/22/21 0318  WBC 17.0* 11.6* 7.8 9.3  NEUTROABS 15.3*  --  5.3 5.4  HGB 13.5 11.3* 12.1* 12.8*  HCT 39.9 33.5* 35.8* 37.9*  MCV 85.4 85.5 84.8 85.2  PLT 277 216 233 278   Cardiac Enzymes: No results for input(s): "CKTOTAL", "CKMB", "CKMBINDEX", "TROPONINI" in the last 168 hours. BNP: BNP (last 3 results) Recent Labs    12/18/21 2000  BNP 74.2    ProBNP (last 3 results) No results for input(s): "PROBNP" in the last 8760 hours.  CBG: No results for input(s): "GLUCAP" in the last 168  hours.  FURTHER DISCHARGE INSTRUCTIONS:   Get Medicines reviewed and adjusted: Please take all your medications with you for your next visit with your Primary MD   Laboratory/radiological data: Please request your Primary MD to go over  all hospital tests and procedure/radiological results at the follow up, please ask your Primary MD to get all Hospital records sent to his/her office.   In some cases, they will be blood work, cultures and biopsy results pending at the time of your discharge. Please request that your primary care M.D. goes through all the records of your hospital data and follows up on these results.   Also Note the following: If you experience worsening of your admission symptoms, develop shortness of breath, life threatening emergency, suicidal or homicidal thoughts you must seek medical attention immediately by calling 911 or calling your MD immediately  if symptoms less severe.   You must read complete instructions/literature along with all the possible adverse reactions/side effects for all the Medicines you take and that have been prescribed to you. Take any new Medicines after you have completely understood and accpet all the possible adverse reactions/side effects.    Do not drive when taking Pain medications or sleeping medications (Benzodaizepines)   Do not take more than prescribed Pain, Sleep and Anxiety Medications. It is not advisable to combine anxiety,sleep and pain medications without talking with your primary care practitioner   Special Instructions: If you have smoked or chewed Tobacco  in the last 2 yrs please stop smoking, stop any regular Alcohol  and or any Recreational drug use.   Wear Seat belts while driving.   Please note: You were cared for by a hospitalist during your hospital stay. Once you are discharged, your primary care physician will handle any further medical issues. Please note that NO REFILLS for any discharge medications will be  authorized once you are discharged, as it is imperative that you return to your primary care physician (or establish a relationship with a primary care physician if you do not have one) for your post hospital discharge needs so that they can reassess your need for medications and monitor your lab values.     Signed:  Florencia Reasons MD, PhD, FACP  [facility] 12/22/2021, 2:36 PM

## 2021-12-22 NOTE — Telephone Encounter (Signed)
Called patient's son, Tung Pustejovsky [phone] to discuss patient's current status. He says he received a call on the phone from the inpatient physician and that patient is planning to come home and they are trying to see how they can prevent a pneumonia in the future since this is his third admission in the last year for this issue. I asked if I could be open and honest with my prognosis based on experience with Dementia, and he said yes, please. Based on the fact that patient is completely incontinent, only speaks 2 words per day, does not walk, and has even lost weight in the last year, he is at a stage 7 of his dementia and the recommendation at this time would be to transition to hospice rather than pursue care in the hospital. I discussed with him concern that patient has a level of aspiration and it is not recommended to have a feeding tube since he has aspiration. Also discussed concern that eventually his infections will be more and more difficult to treat.   He states he does not feel ready to transition to comfort measures and he would like to continue sending his father back to the hospital in the event he gets sick. He says he was happy that the IV fluids helped his dad perk up more. I discussed concern that UTIs and pneumonias are very common during the end of life course for patients with dementia. Each time I mentioned how patient's dementia has progressed, son had examples of how things aren't as bad as they seem I.e. he still has a social smile in his opinion.   In conclusion, there is no way to prevent pneumonias in a patient who has severe dementia and aspiration. [facility] will [facility] -- either back to memory care or to the healthcare center.   Of note, I spoke with social work and nursing who have worked with him for the last 10 years since he has been at [facility] and he has had significant decline in the last 2 years. He previously was very social and danced at the  holiday party 2 years ago.    I spent >30 minutes discussing goals of care with the patient's HCPOA, collecting history from additional historians with nursing, and in chart review for this patient  Tomasa Rand, MD, [place] [phone]

## 2021-12-22 NOTE — Progress Notes (Signed)
Speech Language Pathology Treatment: Dysphagia  Patient Details Name: Robert Le MRN: [id number] DOB: 09/17/1940 Today's Date: 12/22/2021 Time: 6761-9509 SLP Time Calculation (min) (ACUTE ONLY): 30 min  Assessment / Plan / Recommendation Clinical Impression  Pt seen for ongoing assessment of swallowing. He was awake, verbally responsive but w/ tangential speech during engagement w/ SLP. Pt has Baseline dx of Cognitive decline/Dementia.   Pt explained general aspiration precautions and need to sit fully upright for eating/drinking -- MOD cues and support given to sit up for po intake d/t pt's easy distraction and decreased insight/awareness. Pt somewhat assisted w/ positioning and helping to feed himself(held cup w/ cues) trials of soft solids and thin liquids via straw -- MOD support given. No overt clinical s/s of aspiration were noted w/ any consistency; respiratory status remained calm and unlabored, vocal quality clear b/t trials. Pt was consumed Crushed Pills in Puree (w/ nsg) w/ no deficits -- the puree providing cohesion. Oral phase appeared grossly [facility] for bolus management, mastication, and timely A-P transfer for swallowing; oral clearing achieved w/ all consistencies. NSG denied any deficits in swallowing as well.   Pt remains at increased risk for dysphagia and aspiration/aspiration pneumonia given Baseline of Dementia, multiple medical comorbities, and dependence for feeding.  ANY Cognitive decline can impact awareness/timing of oropharyngeal swallowing.  Recommend continue a mech soft diet for ease of soft, cut foods w/ gravies added to moisten foods; Thin liquids. Recommend general aspiration precautions; Feeding support at meals -- reduce distractions at meals. Pills CRUSHED in Puree; tray setup and positioning assistance for meals.  ST services will sign off at this time as pt appears close to/at his Baseline w/ MD to reconsult if needed while admitted. NSG updated. Precautions  posted at bedside.        HPI HPI: Per H&P "Robert Le is a 81 y.o. male with medical history significant for dementia, hypertension, lbrought to the ER due to altered mental status, that was preceded by a week of coughing.  Unable to obtain history due to lethargy and altered mental status with baseline dementia  ED course and data review: Tmax 99.8 with pulse 110, BP 159/91, respirations 20-24 with O2 sat 92% on room air for which she was placed on O2 at 2 L.  VBG was within normal limits Labs otherwise significant for WBC of 17,000 and lactic acid 3.4.  Troponin and BNP WNL.  CMP unremarkable.  EKG nonacute  Imaging: CT head nonacute but showing sinusitis and CT chest showing Bilateral lower lobe airspace opacities concerning for pneumonia.  Head CT IMPRESSION:  1. No acute intracranial abnormality.  2. Stable atrophy and chronic small vessel ischemic change.  3. Paranasal sinus disease with fluid levels in the left maxillary  and sphenoid sinuses, may be due to acute sinusitis.  4. Small amount of air within the right ophthalmic vein and right  muscles of mastication likely related to IV catheter placement.     Chest CT IMPRESSION:  Bilateral lower lobe airspace opacities concerning for pneumonia."      SLP Plan  All goals met      Recommendations for follow up therapy are one component of a multi-disciplinary discharge planning process, led by the attending physician.  Recommendations may be updated based on patient status, additional functional criteria and insurance authorization.    Recommendations  Diet recommendations: Dysphagia 3 (mechanical soft);Thin liquid Liquids provided via: Cup;Straw Medication Administration: Crushed with puree Supervision: Full supervision/cueing for compensatory strategies;Staff to assist  with self feeding Compensations: Minimize environmental distractions;Slow rate;Small sips/bites;Lingual sweep for clearance of pocketing;Follow solids with  liquid Postural Changes and/or Swallow Maneuvers: Out of bed for meals;Seated upright 90 degrees;Upright 30-60 min after meal                General recommendations:  (Dietician f/u) Oral Care Recommendations: Oral care BID;Oral care before and after PO;Staff/trained caregiver to provide oral care Follow Up Recommendations: No SLP follow up Assistance recommended at discharge: Frequent or constant Supervision/Assistance (for feeding) SLP Visit Diagnosis: Dysphagia, unspecified (R13.10) (Dementia/Cognitive decline) Plan: All goals met              Robert Kenner, MS, CCC-SLP Speech Language Pathologist Rehab Services; [place] [phone] (ascom) Robert Le  12/22/2021, 1:20 PM

## 2021-12-22 NOTE — Plan of Care (Signed)
PMT is following for needs. Per notes patient is returning to [facility]. Would recommend ongoing [place] conversations, and outpatient palliative to follow.

## 2021-12-22 NOTE — Plan of Care (Signed)

## 2021-12-23 LAB — CULTURE, BLOOD (ROUTINE X 2)
Culture: NO GROWTH
Culture: NO GROWTH
Special Requests: ADEQUATE
Special Requests: ADEQUATE

## 2021-12-29 ENCOUNTER — Encounter: Payer: Self-pay | Admitting: Nurse Practitioner

## 2021-12-29 ENCOUNTER — Non-Acute Institutional Stay: Payer: Medicare Other | Admitting: Nurse Practitioner

## 2021-12-29 DIAGNOSIS — J189 Pneumonia, unspecified organism: Secondary | ICD-10-CM

## 2021-12-29 DIAGNOSIS — F03C0 Unspecified dementia, severe, without behavioral disturbance, psychotic disturbance, mood disturbance, and anxiety: Secondary | ICD-10-CM | POA: Diagnosis not present

## 2021-12-29 DIAGNOSIS — F339 Major depressive disorder, recurrent, unspecified: Secondary | ICD-10-CM | POA: Diagnosis not present

## 2021-12-29 DIAGNOSIS — Z86718 Personal history of other venous thrombosis and embolism: Secondary | ICD-10-CM | POA: Diagnosis not present

## 2021-12-29 NOTE — Progress Notes (Signed)
Location:  Other [facility]) Nursing Home Room Number: 106-P Place of Service:  ALF [phone]) Provider:  Carlos American. Dewaine Oats, NP   Patient Care Team: [name], MD as PCP - General [facility])  Extended Emergency Contact Information Primary Emergency Contact: [name] of [place] Mobile Phone: [phone] Relation: Son Secondary Emergency Contact: [name] [place] of [place] Mobile Phone: [phone] Relation: Son  Code Status:  DNR Goals of care: Advanced Directive information    12/29/2021    8:54 AM  Advanced Directives  Does Patient Have a Medical Advance Directive? Yes  Type of Paramedic of [place];Out of facility DNR (pink MOST or yellow form)  Does patient want to make changes to medical advance directive? No - Patient declined  Copy of [place] in Chart? Yes - validated most recent copy scanned in chart (See row information)     Chief Complaint  Patient presents with   Hospitalization Follow-up    Follow-up from recent hospital stay. Vitals and medications are a reflection of [facility] system, [facility]      HPI:  Pt is a 81 y.o. male seen today for an acute visit for hospital follow up. Pt with  medical history significant for dementia, hypertension, lbrought to the ER due to altered mental status, that was preceded by a week of coughing.  He had low grade fever with elevated wbc and noted to have CAP and sepsis. He was treated with antibiotics in hospital and completed course while there.  His mentation returned to baseline and he was discharged back to memory care. He has been doing well since back and staff reports he continues to be at baseline.   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   amLODipine (NORVASC) 5 MG tablet Take 5 mg by mouth daily.   apixaban (ELIQUIS) 5 MG TABS tablet Take 5 mg by mouth 2 (two) times daily.   carboxymethylcellulose (ARTIFICIAL TEARS) 1 % ophthalmic solution Apply 1 drop to eye 2 (two) times daily as needed.   DULoxetine (CYMBALTA) 30 MG capsule Give Two capsules by mouth in the morning; Give One capsule by mouth in the  evening daily at 4pm   memantine (NAMENDA) 10 MG tablet Take 10 mg by mouth 2 (two) times daily.   Multiple Vitamin (THERA-TABS PO) Take 1 tablet by mouth daily at 6 (six) AM.   OXYGEN Inhale 2 L into the lungs as needed (to keep O2 sats > 90%).   Polyethylene Glycol 3350 (PEG 3350) 17 g PACK Take 1 packet by mouth every other day.   saccharomyces boulardii (FLORASTOR) 250 MG capsule Take 1 capsule (250 mg total) by mouth 2 (two) times daily.   senna-docusate (SENNA-S) 8.6-50 MG tablet Take 1 tablet by mouth 2 (two) times daily.   [DISCONTINUED] donepezil (ARICEPT) 5 MG tablet Take 5 mg by mouth every  evening.   [DISCONTINUED] feeding supplement (ENSURE ENLIVE / ENSURE PLUS) LIQD Take 237 mLs by mouth 2 (two) times daily between meals.   No facility-administered encounter medications on file as of 12/29/2021.    Review of Systems  Immunization History  Administered Date(s) Administered   Influenza, Seasonal, Injecte, Preservative Fre 11/01/2014, 11/26/2015   Influenza,inj,Quad PF,6+ Mos 11/14/2016   Influenza-Unspecified 11/16/2020, 11/17/2021   Moderna Covid-19 Vaccine Bivalent Booster 15yr & up 10/22/2020   Moderna Sars-Covid-2 Vaccination 02/10/2019, 03/10/2019   Pneumococcal Conjugate-13 01/02/2014   Pneumococcal Polysaccharide-23 09/02/2003   Tdap 06/08/2011   Unspecified SARS-COV-2 Vaccination 12/11/2019   Pertinent  Health Maintenance Due  Topic Date Due   INFLUENZA VACCINE  Completed      12/20/2021   11:15 AM 12/20/2021    9:24 PM 12/21/2021    7:30 AM 12/21/2021    8:00 PM 12/22/2021    9:44 AM  Fall Risk  Patient Fall Risk Level High fall risk High fall risk High fall risk High fall risk High fall risk   Functional Status Survey:    Vitals:   12/29/21 0852  BP: 139/81  Pulse: 79  Weight: 173 lb (78.5 kg)  Height: '5\' 7"'$  (1.702 m)   Body mass index is 27.1 kg/m. Physical Exam  Labs reviewed: Recent Labs    04/18/21 0401 04/19/21 0612 04/20/21 0522 06/14/21 1828 12/19/21 0333 12/20/21 1047 12/22/21 0318  NA 139 142 142   < > 136 138 139  K 3.8 3.7 3.2*   < > 3.7 3.8 3.2*  CL 108 108 107   < > 106 106 106  CO2 '23 28 27   '$ < > '25 26 29  '$ GLUCOSE 114* 102* 109*   < > 114* 89 97  BUN '21 23 20   '$ < > '17 12 14  '$ CREATININE 1.07 0.96 1.08   < > 1.09 0.86 0.97  CALCIUM 8.8* 8.6* 8.5*   < > 8.1* 9.0 8.9  MG 2.1 2.4 2.1  --   --   --   --    < > = values in this interval not displayed.   Recent Labs    06/16/21 0557 06/17/21 0738 12/18/21 1837  AST 45* 31 25  ALT 76* 53* 17  ALKPHOS 75 72 76  BILITOT 0.5 0.6 0.8  PROT 5.6* 6.0* 6.2*   ALBUMIN 3.1* 3.3* 3.1*   Recent Labs    12/18/21 1837 12/19/21 0333 12/20/21 1047 12/22/21 0318  WBC 17.0* 11.6* 7.8 9.3  NEUTROABS 15.3*  --  5.3 5.4  HGB 13.5 11.3* 12.1* 12.8*  HCT 39.9 33.5* 35.8* 37.9*  MCV 85.4 85.5 84.8 85.2  PLT 277 216 233 278   No results found for: "TSH" No results found for: "HGBA1C" No results found  for: "CHOL", "HDL", "LDLCALC", "LDLDIRECT", "TRIG", "CHOLHDL"  Significant Diagnostic Results in last 30 days:  CT Chest Wo Contrast  Result Date: 12/18/2021 CLINICAL DATA:  Pneumonia, complication suspected, xray done EXAM: CT CHEST WITHOUT CONTRAST TECHNIQUE: Multidetector CT imaging of the chest was performed following the standard protocol without IV contrast. RADIATION DOSE REDUCTION: This exam was performed according to the departmental dose-optimization program which includes automated exposure control, adjustment of the mA and/or kV according to patient size and/or use of iterative reconstruction technique. COMPARISON:  04/17/2021 chest CT.  Chest x-ray today. FINDINGS: Cardiovascular: Heart is normal size. Scattered coronary artery and aortic calcifications. No aneurysm. Mediastinum/Nodes: No mediastinal, hilar, or axillary adenopathy. Trachea and esophagus are unremarkable. Thyroid unremarkable. Lungs/Pleura: Bilateral lower lobe airspace opacities. No effusions. Upper Abdomen: No acute findings Musculoskeletal: Chest wall soft tissues are unremarkable. No acute bony abnormality. IMPRESSION: Bilateral lower lobe airspace opacities concerning for pneumonia. Coronary artery disease. Aortic Atherosclerosis (ICD10-I70.0). Electronically Signed   By: Rolm Baptise M.D.   On: 12/18/2021 21:24   CT Head Wo Contrast  Result Date: 12/18/2021 CLINICAL DATA:  Mental status change, unknown cause EXAM: CT HEAD WITHOUT CONTRAST TECHNIQUE: Contiguous axial images were obtained from the base of the skull through the vertex without intravenous contrast. RADIATION DOSE  REDUCTION: This exam was performed according to the departmental dose-optimization program which includes automated exposure control, adjustment of the mA and/or kV according to patient size and/or use of iterative reconstruction technique. COMPARISON:  Head CT 06/14/2021 FINDINGS: Brain: Stable degree moderate atrophy. Stable degree of chronic small vessel ischemic change. No hemorrhage, acute infarct, hydrocephalus, extra-axial collection or mass lesion/mass effect. Vascular: Atherosclerosis of skullbase vasculature without hyperdense vessel or abnormal calcification. Skull: No fracture or focal lesion. Sinuses/Orbits: There is diffuse mucosal thickening throughout the ethmoid air cells in the right maxillary sinus. Fluid levels with frothy debris in the right side of sphenoid sinus. Left maxillary sinus mucosal thickening and fluid level. Small amount of air within the right ophthalmic vein and right muscles of mastication likely related to IV catheter placement. Other: None. IMPRESSION: 1. No acute intracranial abnormality. 2. Stable atrophy and chronic small vessel ischemic change. 3. Paranasal sinus disease with fluid levels in the left maxillary and sphenoid sinuses, may be due to acute sinusitis. 4. Small amount of air within the right ophthalmic vein and right muscles of mastication likely related to IV catheter placement. Electronically Signed   By: Keith Rake M.D.   On: 12/18/2021 19:16   DG Chest Port 1 View  Result Date: 12/18/2021 CLINICAL DATA:  Respiratory distress. Questionable sepsis. Patient unconscious and unable to follow breathing instructions for cxr.Questionable sepsis - evaluate for abnormality EXAM: PORTABLE CHEST 1 VIEW COMPARISON:  06/14/2021 FINDINGS: Extremely low lung volumes similar to comparison exam. Mild venous congestion. No pneumothorax. No focal consolidation. IMPRESSION: Extremely low lung volumes and venous congestion. Electronically Signed   By: Suzy Bouchard  M.D.   On: 12/18/2021 18:39    Assessment/Plan 1. Severe dementia without behavioral disturbance, psychotic disturbance, mood disturbance, or anxiety, unspecified dementia type ([place]) -advanced dementia, currently at baseline after recent hospitalization, continues supportive care of staff  2. History of DVT (deep vein thrombosis) Was discharged back to facility on eliquis however he has completed treatment course. Will stop at this time.  3. Recurrent major depressive disorder, remission status unspecified (HCC) Controlled, dose of Cymbalta prior to hospitalization was 60 mg daily, will update medication at facility.   4. Community acquired bilateral  lower lobe pneumonia -has resolved, completed antibiotics, will dc florastor  No longer needed O2  Luay Balding K. Villanueva, [facility] [phone]

## 2021-12-30 LAB — BASIC METABOLIC PANEL
BUN: 19 (ref 4–21)
CO2: 31 — AB (ref 13–22)
Chloride: 103 (ref 99–108)
Creatinine: 1.1 (ref 0.6–1.3)
Glucose: 91
Potassium: 4.6 mEq/L (ref 3.5–5.1)
Sodium: 137 (ref 137–147)

## 2021-12-30 LAB — HEPATIC FUNCTION PANEL
ALT: 23 U/L (ref 10–40)
AST: 23 (ref 14–40)
Alkaline Phosphatase: 72 (ref 25–125)
Bilirubin, Total: 0.3

## 2021-12-30 LAB — COMPREHENSIVE METABOLIC PANEL
Albumin: 3.3 — AB (ref 3.5–5.0)
Calcium: 8.7 (ref 8.7–10.7)
Globulin: 2.3

## 2021-12-30 LAB — CBC AND DIFFERENTIAL
HCT: 37 — AB (ref 41–53)
Hemoglobin: 12.4 — AB (ref 13.5–17.5)
Neutrophils Absolute: 3540
Platelets: 342 10*3/uL (ref 150–400)
WBC: 6.9

## 2021-12-30 LAB — CBC: RBC: 4.3 (ref 3.87–5.11)

## 2021-12-31 MED ORDER — DULOXETINE HCL 30 MG PO CPEP: 60.0000 mg | ORAL_CAPSULE | Freq: Every day | ORAL | Status: DC

## 2022-01-19 ENCOUNTER — Non-Acute Institutional Stay: Payer: Medicare Other | Admitting: Student

## 2022-01-19 ENCOUNTER — Encounter: Payer: Self-pay | Admitting: Student

## 2022-01-19 DIAGNOSIS — R3989 Other symptoms and signs involving the genitourinary system: Secondary | ICD-10-CM

## 2022-01-19 DIAGNOSIS — Z66 Do not resuscitate: Secondary | ICD-10-CM | POA: Diagnosis not present

## 2022-01-19 DIAGNOSIS — Z7901 Long term (current) use of anticoagulants: Secondary | ICD-10-CM

## 2022-01-19 NOTE — Progress Notes (Signed)
Location:  Other [facility]) Nursing Home Room Number: 106-P Place of Service:  ALF [phone]) Provider:  [name], MD  Patient Care Team: [name], MD as PCP - General (Family Medicine)  Extended Emergency Contact Information Primary Emergency Contact: [name] of [place] Phone: [phone] Relation: Son Secondary Emergency Contact: [name] [place] of [place] Mobile Phone: [phone] Relation: Son  Code Status:  DNR Goals of care: Advanced Directive information    01/19/2022    3:39 PM  Advanced Directives  Does Patient Have a Medical Advance Directive? Yes  Type of Paramedic of [place];Out of facility DNR (pink MOST or yellow form)  Does patient want to make changes to medical advance directive? No - Patient declined  Copy of [place] in Chart? Yes - validated most recent copy scanned in chart (See row information)  Pre-existing out of facility DNR order (yellow form or pink MOST form) Yellow form placed in chart (order not valid for inpatient use)     Chief Complaint  Patient presents with   Acute Visit    Max conversation. Vitals and medications are a reflection of [facility] system, [facility]      HPI:  Pt is a 81 y.o. male seen today for an acute visit for weekend nurse concerned for an episode of pink-tinged urine.  Daytime nurses states all urine since have been normal.  Patient has been at baseline behavior.  Vital signs been stable.  Went to patient's bedside to discuss current status, for each question patient states "that is a hard question" patient is unable to give his name.  Patient says he is "okay".   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   amLODipine (NORVASC) 5 MG tablet Take 5 mg by mouth daily.   carboxymethylcellulose (ARTIFICIAL TEARS) 1 % ophthalmic solution Apply 1 drop to eye 2 (two) times daily as needed.   DULoxetine (CYMBALTA) 60 MG capsule Take 60 mg by mouth daily.   memantine (NAMENDA) 10 MG tablet Take 10 mg by mouth 2 (two) times daily.   Multiple Vitamin (THERA-TABS PO) Take 1 tablet by mouth daily at 6 (six)  AM.   OXYGEN Inhale 2 L into the lungs as needed. To keep o2 saturation at 90 % or greater   Polyethylene Glycol 3350 (PEG 3350) 17 g PACK Take 1 packet by mouth every other day.   senna-docusate (SENNA-S) 8.6-50 MG tablet Take 1 tablet by mouth 2 (two) times daily.   [DISCONTINUED] DULoxetine (CYMBALTA) 30 MG capsule Take 2 capsules (60 mg total) by mouth daily. Give Two capsules by mouth in the morning; Give One capsule by mouth in the evening daily at 4pm   No facility-administered encounter medications on file as of 01/19/2022.    Review of Systems  Unable to perform ROS: Dementia    Immunization  History  Administered Date(s) Administered   Influenza, Seasonal, Injecte, Preservative Fre 11/01/2014, 11/26/2015   Influenza,inj,Quad PF,6+ Mos 11/14/2016   Influenza-Unspecified 11/16/2020, 11/17/2021   Moderna Covid-19 Vaccine Bivalent Booster 66yr & up 10/22/2020   Moderna SARS-COV2 Booster Vaccination 06/19/2020   Moderna Sars-Covid-2 Vaccination 02/10/2019, 03/10/2019, 12/11/2019   Pneumococcal Conjugate-13 01/02/2014   Pneumococcal Polysaccharide-23 09/02/2003   Tdap 06/08/2011   Unspecified SARS-COV-2 Vaccination 12/11/2019   Pertinent  Health Maintenance Due  Topic Date Due   INFLUENZA VACCINE  Completed      12/20/2021   11:15 AM 12/20/2021    9:24 PM 12/21/2021    7:30 AM 12/21/2021    8:00 PM 12/22/2021    9:44 AM  Fall Risk  Patient Fall Risk Level High fall risk High fall risk High fall risk High fall risk High fall risk   Functional Status Survey:    Vitals:   01/19/22 1529  BP: 117/82  Pulse: 81  Weight: 167 lb (75.8 kg)  Height: '5\' 7"'$  (1.702 m)   Body mass index is 26.16 kg/m. Physical Exam Vitals reviewed.  Constitutional:      Appearance: Normal appearance.  Cardiovascular:     Rate and Rhythm: Normal rate.     Pulses: Normal pulses.  Abdominal:     General: Abdomen is flat. Bowel sounds are normal.     Palpations: Abdomen is soft.  Neurological:     Mental Status: He is alert and oriented to person, place, and time.     Labs reviewed: Recent Labs    04/18/21 0401 04/19/21 0612 04/20/21 0522 06/14/21 1828 12/19/21 0333 12/20/21 1047 12/22/21 0318 12/30/21 0000  NA 139 142 142   < > 136 138 139 137  K 3.8 3.7 3.2*   < > 3.7 3.8 3.2* 4.6  CL 108 108 107   < > 106 106 106 103  CO2 '23 28 27   '$ < > '25 26 29 '$ 31*  GLUCOSE 114* 102* 109*   < > 114* 89 97  --   BUN '21 23 20   '$ < > '17 12 14 19  '$ CREATININE 1.07 0.96 1.08   < > 1.09 0.86 0.97 1.1  CALCIUM 8.8* 8.6* 8.5*   < > 8.1* 9.0 8.9 8.7  MG 2.1 2.4 2.1  --   --   --   --   --    <  > = values in this interval not displayed.   Recent Labs    06/16/21 0557 06/17/21 0738 12/18/21 1837 12/30/21 0000  AST 45* '31 25 23  '$ ALT 76* 53* 17 23  ALKPHOS 75 72 76 72  BILITOT 0.5 0.6 0.8  --   PROT 5.6* 6.0* 6.2*  --   ALBUMIN 3.1* 3.3* 3.1* 3.3*   Recent Labs  12/19/21 0333 12/20/21 1047 12/22/21 0318 12/30/21 0000  WBC 11.6* 7.8 9.3 6.9  NEUTROABS  --  5.3 5.4 3,540.00  HGB 11.3* 12.1* 12.8* 12.4*  HCT 33.5* 35.8* 37.9* 37*  MCV 85.5 84.8 85.2  --   PLT 216 233 278 342   No results found for: "TSH" No results found for: "HGBA1C" No results found for: "CHOL", "HDL", "LDLCALC", "LDLDIRECT", "TRIG", "CHOLHDL"  Significant Diagnostic Results in last 30 days:  No results found.  Assessment/Plan Chronic anticoagulation  Do not resuscitate - Plan: Do not attempt resuscitation (DNR) Patient with 1 episode of pink-tinged urine, in the setting of chronic anticoagulation will defer urinalysis at this time.  Will continue to monitor for evidence of infection or persistent hematuria.  Plan to call family HCPOA to further discuss plans in the event that patient does develop frank hematuria.    Family/ staff Communication: nursing  Labs/tests ordered:  none

## 2022-03-17 ENCOUNTER — Encounter: Payer: Self-pay | Admitting: Nurse Practitioner

## 2022-03-17 ENCOUNTER — Non-Acute Institutional Stay: Payer: Medicare Other | Admitting: Nurse Practitioner

## 2022-03-17 DIAGNOSIS — K5900 Constipation, unspecified: Secondary | ICD-10-CM

## 2022-03-17 DIAGNOSIS — F03C0 Unspecified dementia, severe, without behavioral disturbance, psychotic disturbance, mood disturbance, and anxiety: Secondary | ICD-10-CM

## 2022-03-17 DIAGNOSIS — I10 Essential (primary) hypertension: Secondary | ICD-10-CM | POA: Diagnosis not present

## 2022-03-17 DIAGNOSIS — F339 Major depressive disorder, recurrent, unspecified: Secondary | ICD-10-CM | POA: Diagnosis not present

## 2022-03-17 NOTE — Progress Notes (Signed)
Location:  [facility] Room Number: [id number] Place of Service:  ALF [phone]) Provider:  Sherrie Mustache, NP  [name], MD  Patient Care Team: [name], MD as PCP - General (Family Medicine)  Extended Emergency Contact Information Primary Emergency Contact: [name] of [place] Phone: [phone] Relation: Son Secondary Emergency Contact: [name] [place] of [place] Mobile Phone: [phone] Relation: Son  Code Status:  DNR Goals of care: Advanced Directive information    03/17/2022    8:57 AM  Advanced Directives  Does Patient Have a Medical Advance Directive? Yes  Type of Advance Directive Out of facility DNR (pink MOST or yellow form);Healthcare Power of Attorney  Does patient want to make changes to medical advance directive? No - Patient declined  Copy of [place] in Chart? Yes - validated most recent copy scanned in chart (See row information)  Pre-existing out of facility DNR order (yellow form or pink MOST form) Yellow form placed in chart (order not valid for inpatient use)     Chief Complaint  Patient presents with   Medical Management of Chronic Issues    Routine follow up   Immunizations    Shingrix vaccine, Tdap vaccine and a COVID booster due   Quality Metric Gaps    Medicare annual wellness due    HPI:  Pt is a 82 y.o. male seen today for medical management of chronic diseases.  Pt long term resident in memory care at [facility]. There has been no recent changes noted. He is total care by staff and minimally verbal. Weight has been stable.  No signs of anxiety or depression.    Past Medical History:  Diagnosis Date   Acute cholecystitis    Acute metabolic encephalopathy    Alzheimer disease (HCC)    Anxiety    Aortic atherosclerosis (HCC)    Arthritis    Chronic kidney disease    L TOTAL NEPHRECTOMY   History of DVT (deep vein thrombosis)     Hypertension    Sepsis [facility])    Past Surgical History:  Procedure Laterality Date   FRACTURE SURGERY     FRACTURED HIP SURGERY 1960   IR EXCHANGE BILIARY DRAIN  06/10/2021   IR FLUORO PROCEDURE UNLISTED  07/13/2021   IR PERC CHOLECYSTOSTOMY  04/17/2021   NEPHRECTOMY  1985   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   TOTAL HIP ARTHROPLASTY Left 08/20/2013   Procedure: LEFT TOTAL HIP ARTHROPLASTY ANTERIOR APPROACH;  Surgeon: Mauri Pole, MD;  Location: [facility];  Service: Orthopedics;  Laterality: Left;    Allergies  Allergen Reactions   Donepezil Other (See Comments)    Increased confusion Still listed on pt's current med list as of 06/14/2021   Sulfites     Unable to recall reaction or specific sulfa drug    Allergies as of 03/17/2022       Reactions   Donepezil Other (See Comments)   Increased confusion Still listed on pt's current med list as of 06/14/2021   Sulfites    Unable to recall reaction or specific sulfa drug        Medication List        Accurate as of March 17, 2022  9:06 AM. If you have any questions, ask your nurse or doctor.          STOP taking these medications    PEG 3350 17 g Pack Stopped by: Lauree Chandler,  NP       TAKE these medications    acetaminophen 325 MG tablet Commonly known as: TYLENOL Take 650 mg by mouth every 6 (six) hours as needed.   amLODipine 5 MG tablet Commonly known as: NORVASC Take 5 mg by mouth daily.   Artificial Tears 1 % ophthalmic solution Generic drug: carboxymethylcellulose Apply 1 drop to eye 2 (two) times daily as needed.   DULoxetine 60 MG capsule Commonly known as: CYMBALTA Take 60 mg by mouth daily.   memantine 10 MG tablet Commonly known as: NAMENDA Take 10 mg by mouth 2 (two) times daily.   OXYGEN Inhale 2 L into the lungs as needed. To keep o2 saturation at 90 % or greater   Senna-S 8.6-50 MG tablet Generic drug: senna-docusate Take 1 tablet by mouth 2 (two) times daily.   THERA-TABS  PO Take 1 tablet by mouth daily at 6 (six) AM.        Review of Systems  Unable to perform ROS: Dementia    Immunization History  Administered Date(s) Administered   Influenza, Seasonal, Injecte, Preservative Fre 11/01/2014, 11/26/2015   Influenza,inj,Quad PF,6+ Mos 11/14/2016   Influenza-Unspecified 11/16/2020, 11/17/2021   Moderna Covid-19 Vaccine Bivalent Booster 9yr & up 10/22/2020   Moderna SARS-COV2 Booster Vaccination 06/19/2020   Moderna Sars-Covid-2 Vaccination 02/10/2019, 03/10/2019, 12/11/2019   Pneumococcal Conjugate-13 01/02/2014   Pneumococcal Polysaccharide-23 09/02/2003   Tdap 06/08/2011   Unspecified SARS-COV-2 Vaccination 12/11/2019   Pertinent  Health Maintenance Due  Topic Date Due   INFLUENZA VACCINE  Completed      12/20/2021   11:15 AM 12/20/2021    9:24 PM 12/21/2021    7:30 AM 12/21/2021    8:00 PM 12/22/2021    9:44 AM  Fall Risk  (RETIRED) Patient Fall Risk Level High fall risk High fall risk High fall risk High fall risk High fall risk   Functional Status Survey:    Vitals:   03/17/22 0854 03/18/22 1302  BP: (!) 139/91 129/88  Pulse: 84   Resp: 13   Temp: 98.1 F (36.7 C)   SpO2: 93%   Weight: 166 lb 6.4 oz (75.5 kg)   Height: 5' 7"$  (1.702 m)    Body mass index is 26.06 kg/m. Physical Exam Constitutional:      General: He is not in acute distress.    Appearance: He is well-developed. He is not diaphoretic.  HENT:     Head: Normocephalic and atraumatic.     Right Ear: External ear normal.     Left Ear: External ear normal.     Mouth/Throat:     Pharynx: No oropharyngeal exudate.  Eyes:     Conjunctiva/sclera: Conjunctivae normal.     Pupils: Pupils are equal, round, and reactive to light.  Cardiovascular:     Rate and Rhythm: Normal rate and regular rhythm.     Heart sounds: Normal heart sounds.  Pulmonary:     Effort: Pulmonary effort is normal.     Breath sounds: Normal breath sounds.  Abdominal:     General:  Bowel sounds are normal.     Palpations: Abdomen is soft.  Musculoskeletal:        General: No tenderness.     Cervical back: Normal range of motion and neck supple.     Right lower leg: No edema.     Left lower leg: No edema.  Skin:    General: Skin is warm and dry.  Neurological:     Mental  Status: He is alert. Mental status is at baseline. He is disoriented.     Motor: Weakness present.     Gait: Gait abnormal.     Labs reviewed: Recent Labs    04/18/21 0401 04/19/21 0612 04/20/21 0522 06/14/21 1828 12/19/21 0333 12/20/21 1047 12/22/21 0318 12/30/21 0000  NA 139 142 142   < > 136 138 139 137  K 3.8 3.7 3.2*   < > 3.7 3.8 3.2* 4.6  CL 108 108 107   < > 106 106 106 103  CO2 23 28 27   $ < > 25 26 29 $ 31*  GLUCOSE 114* 102* 109*   < > 114* 89 97  --   BUN 21 23 20   $ < > 17 12 14 19  $ CREATININE 1.07 0.96 1.08   < > 1.09 0.86 0.97 1.1  CALCIUM 8.8* 8.6* 8.5*   < > 8.1* 9.0 8.9 8.7  MG 2.1 2.4 2.1  --   --   --   --   --    < > = values in this interval not displayed.   Recent Labs    06/16/21 0557 06/17/21 0738 12/18/21 1837 12/30/21 0000  AST 45* 31 25 23  $ ALT 76* 53* 17 23  ALKPHOS 75 72 76 72  BILITOT 0.5 0.6 0.8  --   PROT 5.6* 6.0* 6.2*  --   ALBUMIN 3.1* 3.3* 3.1* 3.3*   Recent Labs    12/19/21 0333 12/20/21 1047 12/22/21 0318 12/30/21 0000  WBC 11.6* 7.8 9.3 6.9  NEUTROABS  --  5.3 5.4 3,540.00  HGB 11.3* 12.1* 12.8* 12.4*  HCT 33.5* 35.8* 37.9* 37*  MCV 85.5 84.8 85.2  --   PLT 216 233 278 342   No results found for: "TSH" No results found for: "HGBA1C" No results found for: "CHOL", "HDL", "LDLCALC", "LDLDIRECT", "TRIG", "CHOLHDL"  Significant Diagnostic Results in last 30 days:  No results found.  Assessment/Plan 1. Recurrent major depressive disorder, remission status unspecified ([place]) -mood has been stable on cymbatla, continue current regimen.   2. Essential (primary) hypertension -Blood pressure well controlled, goal bp  <140/90 Continue current medications and dietary modifications follow metabolic panel  3. Constipation, unspecified constipation type -controlled on current regimen.   4. 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 [place] [phone]

## 2022-06-09 ENCOUNTER — Encounter: Payer: Self-pay | Admitting: Nurse Practitioner

## 2022-06-09 ENCOUNTER — Non-Acute Institutional Stay (INDEPENDENT_AMBULATORY_CARE_PROVIDER_SITE_OTHER): Payer: Medicare Other | Admitting: Nurse Practitioner

## 2022-06-09 DIAGNOSIS — Z0000 Encounter for general adult medical examination without abnormal findings: Secondary | ICD-10-CM | POA: Diagnosis not present

## 2022-06-09 NOTE — Progress Notes (Signed)
Subjective:   Robert Le is a 82 y.o. male who presents for Medicare Annual/Subsequent preventive examination.  Review of Systems     Cardiac Risk Factors include: advanced age (>56men, >54 women);sedentary lifestyle;hypertension     Objective:    Today's Vitals   06/09/22 0838  BP: 130/88  Pulse: 86  Resp: 16  Temp: (!) 97.5 F (36.4 C)  SpO2: 93%  Weight: 175 lb 6.4 oz (79.6 kg)  Height: 5\' 7"  (1.702 m)   Body mass index is 27.47 kg/m.     06/09/2022    8:43 AM 03/17/2022    8:57 AM 01/19/2022    3:39 PM 12/29/2021    8:54 AM 12/16/2021    9:05 AM 11/12/2021    4:39 PM 07/16/2021   11:30 AM  Advanced Directives  Does Patient Have a Medical Advance Directive? Yes Yes Yes Yes Yes Yes No  Type of Estate agent of [place];Out of facility DNR (pink MOST or yellow form) Out of facility DNR (pink MOST or yellow form);Healthcare Power of [place] of [place];Out of facility DNR (pink MOST or yellow form) Healthcare Power of [place];Out of facility DNR (pink MOST or yellow form) Healthcare Power of [place];Out of facility DNR (pink MOST or yellow form) Healthcare Power of Attorney   Does patient want to make changes to medical advance directive? No - Patient declined No - Patient declined No - Patient declined No - Patient declined No - Patient declined No - Patient declined   Copy of Healthcare Power of Attorney in Chart? Yes - validated most recent copy scanned in chart (See row information) Yes - validated most recent copy scanned in chart (See row information) Yes - validated most recent copy scanned in chart (See row information) Yes - validated most recent copy scanned in chart (See row information) Yes - validated most recent copy scanned in chart (See row information)    Would patient like information on creating a medical advance directive?       No - Patient declined  Pre-existing out of facility DNR order (yellow form or pink  MOST form)  Yellow form placed in chart (order not valid for inpatient use) Yellow form placed in chart (order not valid for inpatient use)        Current Medications (verified) Outpatient Encounter Medications as of 06/09/2022  Medication Sig   acetaminophen (TYLENOL) 325 MG tablet Take 650 mg by mouth every 6 (six) hours as needed.   amLODipine (NORVASC) 5 MG tablet Take 5 mg by mouth daily.   carboxymethylcellulose (ARTIFICIAL TEARS) 1 % ophthalmic solution Apply 1 drop to eye 2 (two) times daily as needed.   DULoxetine (CYMBALTA) 60 MG capsule Take 60 mg by mouth daily.   memantine (NAMENDA) 10 MG tablet Take 10 mg by mouth 2 (two) times daily.   Multiple Vitamin (THERA-TABS PO) Take 1 tablet by mouth daily at 6 (six) AM.   OXYGEN 2lpm as needed to keep O2 saturation at 90% or greater.   senna-docusate (SENNA-S) 8.6-50 MG tablet Take 1 tablet by mouth 2 (two) times daily.   No facility-administered encounter medications on file as of 06/09/2022.    Allergies (verified) Donepezil and Sulfites   History: Past Medical History:  Diagnosis Date   Acute cholecystitis    Acute metabolic encephalopathy    Alzheimer disease (HCC)    Anxiety    Aortic atherosclerosis (HCC)    Arthritis    Chronic kidney disease  L TOTAL NEPHRECTOMY   History of DVT (deep vein thrombosis)    Hypertension    Sepsis [facility])    Past Surgical History:  Procedure Laterality Date   FRACTURE SURGERY     FRACTURED HIP SURGERY 1960   IR EXCHANGE BILIARY DRAIN  06/10/2021   IR FLUORO PROCEDURE UNLISTED  07/13/2021   IR PERC CHOLECYSTOSTOMY  04/17/2021   NEPHRECTOMY  1985   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   TOTAL HIP ARTHROPLASTY Left 08/20/2013   Procedure: LEFT TOTAL HIP ARTHROPLASTY ANTERIOR APPROACH;  Surgeon: Shelda Pal, MD;  Location: [facility];  Service: Orthopedics;  Laterality: Left;   Family History  Problem Relation Age of Onset   Heart disease Father    Arthritis Brother    Arthritis Brother     Cancer Neg Hx    Diabetes Neg Hx    Social History   Socioeconomic History   Marital status: Divorced    Spouse name: Not on file   Number of children: 3   Years of education: Not on file   Highest education level: Not on file  Occupational History   Occupation: Horticulturist, commercial    Comment: Retired  Tobacco Use   Smoking status: Never   Smokeless tobacco: Never  Building services engineer Use: Never used  Substance and Sexual Activity   Alcohol use: No   Drug use: No   Sexual activity: Not Currently  Other Topics Concern   Not on file  Social History Narrative   Not sure about advanced directives   Would want sons to make decisions   Would accept resuscitation attempts   Not sure about tube feeds   Social Determinants of Health   Financial Resource Strain: Not on file  Food Insecurity: Not on file  Transportation Needs: Not on file  Physical Activity: Not on file  Stress: Not on file  Social Connections: Not on file    Tobacco Counseling Counseling given: Not Answered   Clinical Intake:  Pre-visit preparation completed: Yes  Pain : No/denies pain     BMI - recorded: 27 Nutritional Status: BMI 25 -29 Overweight Nutritional Risks: None Diabetes: No  How often do you need to have someone help you when you read instructions, pamphlets, or other written materials from your doctor or pharmacy?: 5 - Always  Diabetic?no         Activities of Daily Living    06/09/2022   12:45 PM 06/15/2021    2:35 PM  In your present state of health, do you have any difficulty performing the following activities:  Hearing? 0   Vision? 0   Difficulty concentrating or making decisions? 1   Walking or climbing stairs? 1   Dressing or bathing? 1   Doing errands, shopping? 1 0  Preparing Food and eating ? Y   Using the Toilet? Y   In the past six months, have you accidently leaked urine? Y   Do you have problems with loss of bowel control? Y   Managing your  Medications? Y   Managing your Finances? Y   Housekeeping or managing your Housekeeping? Y     Patient Care Team: Earnestine Mealing, MD as PCP - General (Family Medicine)  Indicate any recent Medical Services you may have received from other than [facility] providers in the past year (date may be approximate).     Assessment:   This is a routine wellness examination for Rayn.  Hearing/Vision screen No results  found.  Dietary issues and exercise activities discussed: Current Exercise Habits: The patient does not participate in regular exercise at present   Goals Addressed   None    Depression Screen    04/25/2016    3:37 PM 04/21/2015    1:00 PM  PHQ 2/9 Scores  PHQ - 2 Score 0 0    Fall Risk    06/09/2022   12:45 PM 04/25/2016    3:37 PM 04/21/2015    1:00 PM  Fall Risk   Falls in the past year? Exclusion - non ambulatory Yes Yes  Number falls in past yr:  1 1  Injury with Fall?  No No    FALL RISK PREVENTION PERTAINING TO THE HOME:  Any stairs in or around the home? No  If so, are there any without handrails? na Home free of loose throw rugs in walkways, pet beds, electrical cords, etc? Yes  Adequate lighting in your home to reduce risk of falls? Yes   ASSISTIVE DEVICES UTILIZED TO PREVENT FALLS:  Life alert? No  Use of a cane, walker or w/c? Yes  Grab bars in the bathroom? Yes  Shower chair or bench in shower? Yes  Elevated toilet seat or a handicapped toilet? Yes   TIMED UP AND GO:  Was the test performed? No .    Cognitive Function:        Immunizations Immunization History  Administered Date(s) Administered   Covid-19, Mrna,Vaccine(Spikevax)50yrs and older 05/10/2022   Influenza, Seasonal, Injecte, Preservative Fre 11/01/2014, 11/26/2015   Influenza,inj,Quad PF,6+ Mos 11/14/2016   Influenza-Unspecified 11/16/2020, 11/17/2021   Moderna Covid-19 Vaccine Bivalent Booster 65yrs & up 10/22/2020   Moderna SARS-COV2 Booster Vaccination 06/19/2020    Moderna Sars-Covid-2 Vaccination 02/10/2019, 03/10/2019, 12/11/2019   Pneumococcal Conjugate-13 01/02/2014   Pneumococcal Polysaccharide-23 09/02/2003   Tdap 06/08/2011   Unspecified SARS-COV-2 Vaccination 12/11/2019    TDAP status: Due, Education has been provided regarding the importance of this vaccine. Advised may receive this vaccine at local pharmacy or Health Dept. Aware to provide a copy of the vaccination record if obtained from local pharmacy or Health Dept. Verbalized acceptance and understanding.  Flu Vaccine status: Up to date  Pneumococcal vaccine status: Up to date  Covid-19 vaccine status: Information provided on how to obtain vaccines.   Qualifies for Shingles Vaccine? Yes   Zostavax completed No   Shingrix Completed?: No.    Education has been provided regarding the importance of this vaccine. Patient has been advised to call insurance company to determine out of pocket expense if they have not yet received this vaccine. Advised may also receive vaccine at local pharmacy or Health Dept. Verbalized acceptance and understanding.  Screening Tests Health Maintenance  Topic Date Due   Zoster Vaccines- Shingrix (1 of 2) Never done   DTaP/Tdap/Td (2 - Td or Tdap) 06/07/2021   COVID-19 Vaccine (7 - 2023-24 season) 07/05/2022   INFLUENZA VACCINE  09/01/2022   Medicare Annual Wellness (AWV)  06/09/2023   Pneumonia Vaccine 30+ Years old  Completed   HPV VACCINES  Aged Out    Health Maintenance  Health Maintenance Due  Topic Date Due   Zoster Vaccines- Shingrix (1 of 2) Never done   DTaP/Tdap/Td (2 - Td or Tdap) 06/07/2021    Colorectal cancer screening: No longer required.   Lung Cancer Screening: (Low Dose CT Chest recommended if Age 66-80 years, 30 pack-year currently smoking OR have quit w/in 15years.) does not qualify.   Lung Cancer Screening  Referral: na  Additional Screening:  Hepatitis C Screening: does not qualify  Vision Screening: Recommended annual  ophthalmology exams for early detection of glaucoma and other disorders of the eye. Is the patient up to date with their annual eye exam?  No  Due to dementia annual dental exams for proper oral hygiene  Community Resource Referral / Chronic Care Management: CRR required this visit?  No   CCM required this visit?  No      Plan:     I have personally reviewed and noted the following in the patient's chart:   Medical and social history Use of alcohol, tobacco or illicit drugs  Current medications and supplements including opioid prescriptions. Patient is not currently taking opioid prescriptions. Functional ability and status Nutritional status Physical activity Advanced directives List of other physicians Hospitalizations, surgeries, and ER visits in previous 12 months Vitals Screenings to include cognitive, depression, and falls Referrals and appointments  In addition, I have reviewed and discussed with patient certain preventive protocols, quality metrics, and best practice recommendations. A written personalized care plan for preventive services as well as general preventive health recommendations were provided to patient.     Sharon Seller, NP   06/09/2022   Place of service: [facility]

## 2022-06-24 ENCOUNTER — Encounter: Payer: Self-pay | Admitting: Student

## 2022-06-24 ENCOUNTER — Non-Acute Institutional Stay: Payer: Medicare Other | Admitting: Student

## 2022-06-24 DIAGNOSIS — G309 Alzheimer's disease, unspecified: Secondary | ICD-10-CM | POA: Diagnosis not present

## 2022-06-24 DIAGNOSIS — M159 Polyosteoarthritis, unspecified: Secondary | ICD-10-CM

## 2022-06-24 DIAGNOSIS — M6281 Muscle weakness (generalized): Secondary | ICD-10-CM | POA: Diagnosis not present

## 2022-06-24 DIAGNOSIS — I10 Essential (primary) hypertension: Secondary | ICD-10-CM | POA: Diagnosis not present

## 2022-06-24 DIAGNOSIS — F0280 Dementia in other diseases classified elsewhere without behavioral disturbance: Secondary | ICD-10-CM

## 2022-06-24 DIAGNOSIS — Z741 Need for assistance with personal care: Secondary | ICD-10-CM

## 2022-06-24 NOTE — Progress Notes (Unsigned)
Location:  Other [facility].  Nursing Home Room Number: [facility] 106P Place of Service:  ALF [phone]) Provider:  Earnestine Mealing, MD  Patient Care Team: Earnestine Mealing, MD as PCP - General (Family Medicine)  Extended Emergency Contact Information Primary Emergency Contact: [name] of [place] Phone: [phone] Relation: Son Secondary Emergency Contact: [name] of [place] Mobile Phone: [phone] Relation: Son  Code Status:  DNR Goals of care: Advanced Directive information    06/24/2022    9:52 AM  Advanced Directives  Does Patient Have a Medical Advance Directive? Yes  Type of Estate agent of [place];Out of facility DNR (pink MOST or yellow form)  Does patient want to make changes to medical advance directive? No - Patient declined  Copy of Healthcare Power of Attorney in Chart? Yes - validated most recent copy scanned in chart (See row information)     Chief Complaint  Patient presents with   Medical Management of Chronic Issues    Medical Management of Chronic Issues.     HPI:  Pt is a 82 y.o. male seen today for medical management of chronic diseases.    Patient smiles and is pleasant. Says, How are you? But no additional spontaneous conversation. Patient cannot give his name, date of birht  Per nursing, patient is increasingly dependent. He is total care, wheelchair bound, and is fed regularly.   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   amLODipine (NORVASC) 5 MG tablet Take 5 mg by mouth daily.   carboxymethylcellulose (ARTIFICIAL TEARS) 1 % ophthalmic solution Apply 1 drop to eye 2 (two) times daily as needed.   DULoxetine (CYMBALTA) 60 MG capsule Take 60 mg by mouth daily.   memantine (NAMENDA) 10 MG tablet Take 10 mg by mouth 2 (two) times daily.   Multiple Vitamin (THERA-TABS PO) Take 1 tablet by mouth daily at 6 (six) AM.   OXYGEN 2lpm as needed to keep O2 saturation at 90% or greater.   senna-docusate (SENNA-S) 8.6-50 MG tablet Take 1 tablet by mouth 2 (two) times daily.   No facility-administered encounter medications on file as of 06/24/2022.    Review of Systems  Immunization  History  Administered Date(s) Administered   Covid-19, Mrna,Vaccine(Spikevax)81yrs and older 05/10/2022   Influenza, Seasonal, Injecte, Preservative Fre 11/01/2014, 11/26/2015   Influenza,inj,Quad PF,6+ Mos 11/14/2016   Influenza-Unspecified 11/16/2020, 11/17/2021   Moderna Covid-19 Vaccine Bivalent Booster 35yrs & up 10/22/2020   Moderna SARS-COV2 Booster Vaccination 06/19/2020   Moderna Sars-Covid-2 Vaccination 02/10/2019, 03/10/2019, 12/11/2019   PFIZER(Purple Top)SARS-COV-2 Vaccination 12/10/2021   Pneumococcal Conjugate-13 01/02/2014   Pneumococcal Polysaccharide-23 09/02/2003   Tdap  06/08/2011   Unspecified SARS-COV-2 Vaccination 12/11/2019   Pertinent  Health Maintenance Due  Topic Date Due   INFLUENZA VACCINE  09/01/2022      12/20/2021    9:24 PM 12/21/2021    7:30 AM 12/21/2021    8:00 PM 12/22/2021    9:44 AM 06/09/2022   12:45 PM  Fall Risk  Falls in the past year?     Exclusion - non ambulatory  (RETIRED) Patient Fall Risk Level High fall risk High fall risk High fall risk High fall risk    Functional Status Survey:    Vitals:   06/24/22 0946  BP: 133/83  Pulse: 76  Resp: 16  Temp: (!) 97.5 F (36.4 C)  SpO2: 93%  Weight: 175 lb 6.4 oz (79.6 kg)  Height: 5\' 7"  (1.702 m)   Body mass index is 27.47 kg/m. Physical Exam Constitutional:      Appearance: Normal appearance.  Cardiovascular:     Rate and Rhythm: Normal rate and regular rhythm.     Pulses: Normal pulses.     Heart sounds: Normal heart sounds.  Pulmonary:     Effort: Pulmonary effort is normal.     Breath sounds: Normal breath sounds.  Abdominal:     General: Abdomen is flat.  Musculoskeletal:     Comments: 2+ bilateral pedal edema  Skin:    General: Skin is warm and dry.  Neurological:     Mental Status: He is alert. He is disoriented.     Labs reviewed: Recent Labs    12/19/21 0333 12/20/21 1047 12/22/21 0318 12/30/21 0000  NA 136 138 139 137  K 3.7 3.8 3.2* 4.6  CL 106 106 106 103  CO2 25 26 29  31*  GLUCOSE 114* 89 97  --   BUN 17 12 14 19   CREATININE 1.09 0.86 0.97 1.1  CALCIUM 8.1* 9.0 8.9 8.7   Recent Labs    12/18/21 1837 12/30/21 0000  AST 25 23  ALT 17 23  ALKPHOS 76 72  BILITOT 0.8  --   PROT 6.2*  --   ALBUMIN 3.1* 3.3*   Recent Labs    12/19/21 0333 12/20/21 1047 12/22/21 0318 12/30/21 0000  WBC 11.6* 7.8 9.3 6.9  NEUTROABS  --  5.3 5.4 3,540.00  HGB 11.3* 12.1* 12.8* 12.4*  HCT 33.5* 35.8* 37.9* 37*  MCV 85.5 84.8 85.2  --   PLT 216 233 278 342   No results found for: "TSH" No results found for: "HGBA1C" No results found  for: "CHOL", "HDL", "LDLCALC", "LDLDIRECT", "TRIG", "CHOLHDL"  Significant Diagnostic Results in last 30 days:  No results found.  Assessment/Plan 1. Alzheimer's disease, unspecified (HCC) Patient continues to decline. Weight is stable. Minimally verbal. Incontinent of bowel and bladder. FAST 7a. Continue goals of care conversations with family. Continue supportive care. Continue namenda 10 mg. Continue cymbalta, consider dose reduction at follow up.   2. Essential (primary) hypertension BP well-controlled with norvasc 5 mg daily.   3. Generalized osteoarthritis Tylenol daily prn.   4.  Muscle weakness (generalized) 5. Need for assistance with personal care Patient now receives total care for his needs.   Family/ staff Communication: nursing  Labs/tests ordered:  none

## 2022-06-26 ENCOUNTER — Encounter: Payer: Self-pay | Admitting: Student

## 2022-07-02 ENCOUNTER — Other Ambulatory Visit: Payer: Self-pay

## 2022-07-02 ENCOUNTER — Telehealth: Payer: Self-pay | Admitting: Internal Medicine

## 2022-07-02 ENCOUNTER — Emergency Department: Payer: Medicare Other

## 2022-07-02 ENCOUNTER — Encounter: Payer: Self-pay | Admitting: Emergency Medicine

## 2022-07-02 ENCOUNTER — Inpatient Hospital Stay
Admission: EM | Admit: 2022-07-02 | Discharge: 2022-07-04 | DRG: 300 | Disposition: A | Discharge status: Skilled Nursing Facility | Payer: Medicare Other | Source: Skilled Nursing Facility | Attending: Internal Medicine | Admitting: Internal Medicine

## 2022-07-02 DIAGNOSIS — Z8249 Family history of ischemic heart disease and other diseases of the circulatory system: Secondary | ICD-10-CM | POA: Diagnosis not present

## 2022-07-02 DIAGNOSIS — F0283 Dementia in other diseases classified elsewhere, unspecified severity, with mood disturbance: Secondary | ICD-10-CM | POA: Diagnosis present

## 2022-07-02 DIAGNOSIS — F418 Other specified anxiety disorders: Secondary | ICD-10-CM | POA: Diagnosis not present

## 2022-07-02 DIAGNOSIS — Z993 Dependence on wheelchair: Secondary | ICD-10-CM | POA: Diagnosis not present

## 2022-07-02 DIAGNOSIS — G309 Alzheimer's disease, unspecified: Secondary | ICD-10-CM | POA: Diagnosis present

## 2022-07-02 DIAGNOSIS — Z79899 Other long term (current) drug therapy: Secondary | ICD-10-CM

## 2022-07-02 DIAGNOSIS — I10 Essential (primary) hypertension: Secondary | ICD-10-CM | POA: Diagnosis present

## 2022-07-02 DIAGNOSIS — Z86718 Personal history of other venous thrombosis and embolism: Secondary | ICD-10-CM | POA: Diagnosis not present

## 2022-07-02 DIAGNOSIS — Z66 Do not resuscitate: Secondary | ICD-10-CM | POA: Diagnosis present

## 2022-07-02 DIAGNOSIS — F32A Depression, unspecified: Secondary | ICD-10-CM | POA: Diagnosis present

## 2022-07-02 DIAGNOSIS — Z96642 Presence of left artificial hip joint: Secondary | ICD-10-CM | POA: Diagnosis present

## 2022-07-02 DIAGNOSIS — I82409 Acute embolism and thrombosis of unspecified deep veins of unspecified lower extremity: Secondary | ICD-10-CM | POA: Diagnosis present

## 2022-07-02 DIAGNOSIS — M1990 Unspecified osteoarthritis, unspecified site: Secondary | ICD-10-CM | POA: Diagnosis present

## 2022-07-02 DIAGNOSIS — I700 Atherosclerosis of aorta: Secondary | ICD-10-CM | POA: Diagnosis present

## 2022-07-02 DIAGNOSIS — Z8261 Family history of arthritis: Secondary | ICD-10-CM

## 2022-07-02 DIAGNOSIS — I824Y1 Acute embolism and thrombosis of unspecified deep veins of right proximal lower extremity: Secondary | ICD-10-CM | POA: Diagnosis not present

## 2022-07-02 DIAGNOSIS — M7989 Other specified soft tissue disorders: Secondary | ICD-10-CM | POA: Diagnosis present

## 2022-07-02 DIAGNOSIS — I82401 Acute embolism and thrombosis of unspecified deep veins of right lower extremity: Secondary | ICD-10-CM | POA: Diagnosis present

## 2022-07-02 DIAGNOSIS — Z888 Allergy status to other drugs, medicaments and biological substances status: Secondary | ICD-10-CM

## 2022-07-02 DIAGNOSIS — F0280 Dementia in other diseases classified elsewhere without behavioral disturbance: Secondary | ICD-10-CM

## 2022-07-02 DIAGNOSIS — Z905 Acquired absence of kidney: Secondary | ICD-10-CM

## 2022-07-02 DIAGNOSIS — Z882 Allergy status to sulfonamides status: Secondary | ICD-10-CM

## 2022-07-02 DIAGNOSIS — F03C0 Unspecified dementia, severe, without behavioral disturbance, psychotic disturbance, mood disturbance, and anxiety: Secondary | ICD-10-CM | POA: Diagnosis present

## 2022-07-02 LAB — HEPARIN LEVEL (UNFRACTIONATED)
Heparin Unfractionated: <0.1 IU/mL — ABNORMAL LOW (ref 0.30–0.70)
Heparin Unfractionated: >1.1 IU/mL — ABNORMAL HIGH (ref 0.30–0.70)

## 2022-07-02 LAB — BASIC METABOLIC PANEL
Anion gap: 6 (ref 5–15)
BUN: 19 mg/dL (ref 8–23)
CO2: 29 mmol/L (ref 22–32)
Calcium: 9.5 mg/dL (ref 8.9–10.3)
Chloride: 106 mmol/L (ref 98–111)
Creatinine, Ser: 1.16 mg/dL (ref 0.61–1.24)
GFR, Estimated: >60 mL/min (ref >60)
Glucose, Bld: 103 mg/dL — ABNORMAL HIGH (ref 70–99)
Potassium: 3.9 mmol/L (ref 3.5–5.1)
Sodium: 141 mmol/L (ref 135–145)

## 2022-07-02 LAB — PROTIME-INR
INR: 1 (ref 0.8–1.2)
Prothrombin Time: 13.2 seconds (ref 11.4–15.2)

## 2022-07-02 LAB — CBC
HCT: 43.4 % (ref 39.0–52.0)
Hemoglobin: 13.7 g/dL (ref 13.0–17.0)
MCH: 28.4 pg (ref 26.0–34.0)
MCHC: 31.6 g/dL (ref 30.0–36.0)
MCV: 89.9 fL (ref 80.0–100.0)
Platelets: 245 10*3/uL (ref 150–400)
RBC: 4.83 MIL/uL (ref 4.22–5.81)
RDW: 14.9 % (ref 11.5–15.5)
WBC: 8.8 10*3/uL (ref 4.0–10.5)
nRBC: 0 % (ref 0.0–0.2)

## 2022-07-02 LAB — APTT: aPTT: 32 seconds (ref 24–36)

## 2022-07-02 MED ORDER — ACETAMINOPHEN 325 MG PO TABS: 650.0000 mg | ORAL_TABLET | Freq: Four times a day (QID) | ORAL | Status: DC | PRN

## 2022-07-02 MED ORDER — MEMANTINE HCL 5 MG PO TABS
10.0000 mg | ORAL_TABLET | Freq: Two times a day (BID) | ORAL | Status: DC
  Administered 2022-07-02 – 2022-07-04 (×4): 10 mg via ORAL
  Filled 2022-07-02 (×4): qty 2

## 2022-07-02 MED ORDER — ONDANSETRON HCL 4 MG/2ML IJ SOLN: 4.0000 mg | Freq: Three times a day (TID) | INTRAMUSCULAR | Status: DC | PRN

## 2022-07-02 MED ORDER — POLYVINYL ALCOHOL 1.4 % OP SOLN: 1.0000 [drp] | Freq: Two times a day (BID) | OPHTHALMIC | Status: DC | PRN

## 2022-07-02 MED ORDER — HYDRALAZINE HCL 20 MG/ML IJ SOLN: 5.0000 mg | INTRAMUSCULAR | Status: DC | PRN

## 2022-07-02 MED ORDER — HEPARIN BOLUS VIA INFUSION
5500.0000 [IU] | Freq: Once | INTRAVENOUS | Status: AC
  Administered 2022-07-02: 5500 [IU] via INTRAVENOUS
  Filled 2022-07-02: qty 5500

## 2022-07-02 MED ORDER — HEPARIN (PORCINE) 25000 UT/250ML-% IV SOLN
1350.0000 [IU]/h | INTRAVENOUS | Status: DC
  Administered 2022-07-02: 1350 [IU]/h via INTRAVENOUS
  Filled 2022-07-02: qty 250

## 2022-07-02 MED ORDER — SENNOSIDES-DOCUSATE SODIUM 8.6-50 MG PO TABS
1.0000 | ORAL_TABLET | Freq: Two times a day (BID) | ORAL | Status: DC
  Administered 2022-07-02 – 2022-07-04 (×4): 1 via ORAL
  Filled 2022-07-02 (×4): qty 1

## 2022-07-02 MED ORDER — AMLODIPINE BESYLATE 5 MG PO TABS
5.0000 mg | ORAL_TABLET | Freq: Every day | ORAL | Status: DC
  Administered 2022-07-03 – 2022-07-04 (×2): 5 mg via ORAL
  Filled 2022-07-02 (×2): qty 1

## 2022-07-02 MED ORDER — DULOXETINE HCL 30 MG PO CPEP
60.0000 mg | ORAL_CAPSULE | Freq: Every day | ORAL | Status: DC
  Administered 2022-07-03 – 2022-07-04 (×2): 60 mg via ORAL
  Filled 2022-07-02 (×2): qty 2

## 2022-07-02 MED ORDER — OXYCODONE-ACETAMINOPHEN 5-325 MG PO TABS: 1.0000 | ORAL_TABLET | ORAL | Status: DC | PRN

## 2022-07-02 MED ORDER — ADULT MULTIVITAMIN W/MINERALS CH
1.0000 | ORAL_TABLET | Freq: Every day | ORAL | Status: DC
  Administered 2022-07-03 – 2022-07-04 (×2): 1 via ORAL
  Filled 2022-07-02 (×2): qty 1

## 2022-07-02 MED ORDER — HEPARIN (PORCINE) 25000 UT/250ML-% IV SOLN
1000.0000 [IU]/h | INTRAVENOUS | Status: DC
  Administered 2022-07-03: 1100 [IU]/h via INTRAVENOUS
  Filled 2022-07-02 (×2): qty 250

## 2022-07-02 NOTE — ED Triage Notes (Signed)
Pt to ED via [facility] from [facility]. Pt is non-ambulatory at baseline. Pt has hx/o Alzheimer's. Pt has had swelling, redness, and warmth in his right leg. [facility] attempted to get ultrasound at facility but they were unable to. Pt is in NAD at this. Per EMS faint pulse present in right foot.   Pt has notable size difference in right leg, patient lower leg and foot are warm to the touch, pt has purplish discoloration to the toe and upper posterior foot surface. Pt noted to have healing wound on right shin.

## 2022-07-02 NOTE — Consult Note (Signed)
[facility] Vascular Consult Note  MRN : [id number]  Robert Le is a 82 y.o. (06-20-1940) male who presents with chief complaint of  Chief Complaint  Patient presents with   Leg Swelling  .  History of Present Illness: 82yom with history of Alzheimer dementia, history of LEFT lower extremity DVT in March 2023-  placed on Eliquis for ~15months. Presents from [facility] with RIGHT lower extremity swelling, In ED, evaluation revealed extensive DVT. No respiratory symptoms.  Current Facility-Administered Medications  Medication Dose Route Frequency Provider Last Rate Last Admin   acetaminophen (TYLENOL) tablet 650 mg  650 mg Oral Q6H PRN Lorretta Harp, MD       [START ON 07/03/2022] amLODipine (NORVASC) tablet 5 mg  5 mg Oral Daily Lorretta Harp, MD       [START ON 07/03/2022] DULoxetine (CYMBALTA) DR capsule 60 mg  60 mg Oral Daily Lorretta Harp, MD       heparin ADULT infusion 100 units/mL (25000 units/270mL)  1,350 Units/hr Intravenous Continuous Orson Aloe, RPH 13.5 mL/hr at 07/02/22 1546 1,350 Units/hr at 07/02/22 1546   hydrALAZINE (APRESOLINE) injection 5 mg  5 mg Intravenous Q2H PRN Lorretta Harp, MD       memantine [facility]) tablet 10 mg  10 mg Oral BID Lorretta Harp, MD       Melene Muller ON 07/03/2022] multivitamin with minerals tablet 1 tablet  1 tablet Oral [id number] Lorretta Harp, MD       ondansetron [facility]) injection 4 mg  4 mg Intravenous Q8H PRN Lorretta Harp, MD       oxyCODONE-acetaminophen (PERCOCET/ROXICET) 5-325 MG per tablet 1 tablet  1 tablet Oral Q4H PRN Lorretta Harp, MD       polyvinyl alcohol (LIQUIFILM TEARS) 1.4 % ophthalmic solution 1 drop  1 drop Both Eyes BID PRN Lorretta Harp, MD       senna-docusate (Senokot-S) tablet 1 tablet  1 tablet Oral BID Lorretta Harp, MD        Past Medical History:  Diagnosis Date   Acute cholecystitis    Acute metabolic encephalopathy    Alzheimer disease (HCC)    Anxiety    Aortic atherosclerosis (HCC)    Arthritis    Chronic  kidney disease    L TOTAL NEPHRECTOMY   History of DVT (deep vein thrombosis)    Hypertension    Sepsis [facility])     Past Surgical History:  Procedure Laterality Date   FRACTURE SURGERY     FRACTURED HIP SURGERY 1960   IR EXCHANGE BILIARY DRAIN  06/10/2021   IR FLUORO PROCEDURE UNLISTED  07/13/2021   IR PERC CHOLECYSTOSTOMY  04/17/2021   NEPHRECTOMY  1985   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   TOTAL HIP ARTHROPLASTY Left 08/20/2013   Procedure: LEFT TOTAL HIP ARTHROPLASTY ANTERIOR APPROACH;  Surgeon: Shelda Pal, MD;  Location: [facility];  Service: Orthopedics;  Laterality: Left;    Social History Social History   Tobacco Use   Smoking status: Never   Smokeless tobacco: Never  Vaping Use   Vaping Use: Never used  Substance Use Topics   Alcohol use: No   Drug use: No    Family History Family History  Problem Relation Age of Onset   Heart disease Father    Arthritis Brother    Arthritis Brother    Cancer Neg Hx    Diabetes Neg Hx     Allergies  Allergen Reactions   Donepezil Other (See Comments)  Increased confusion Still listed on pt's current med list as of 06/14/2021   Sulfites     Unable to recall reaction or specific sulfa drug     REVIEW OF SYSTEMS (Negative unless checked) Patient with Alzheimers dementia, obtained from chart Constitutional: [] Weight loss  [] Fever  [] Chills Cardiac: [] Chest pain   [] Chest pressure   [] Palpitations   [] Shortness of breath when laying flat   [] Shortness of breath at rest   [] Shortness of breath with exertion. Vascular:  [] Pain in legs with walking   [] Pain in legs at rest   [] Pain in legs when laying flat   [] Claudication   [] Pain in feet when walking  [] Pain in feet at rest  [] Pain in feet when laying flat   [] History of DVT   [] Phlebitis   [] Swelling in legs   [] Varicose veins   [] Non-healing ulcers Pulmonary:   [] Uses home oxygen   [] Productive cough   [] Hemoptysis   [] Wheeze  [] COPD   [] Asthma Neurologic:  [] Dizziness   [] Blackouts   [] Seizures   [] History of stroke   [] History of TIA  [] Aphasia   [] Temporary blindness   [] Dysphagia   [] Weakness or numbness in arms   [] Weakness or numbness in legs Musculoskeletal:  [] Arthritis   [] Joint swelling   [] Joint pain   [] Low back pain Hematologic:  [] Easy bruising  [] Easy bleeding   [] Hypercoagulable state   [] Anemic  [] Hepatitis Gastrointestinal:  [] Blood in stool   [] Vomiting blood  [] Gastroesophageal reflux/heartburn   [] Difficulty swallowing. Genitourinary:  [] Chronic kidney disease   [] Difficult urination  [] Frequent urination  [] Burning with urination   [] Blood in urine Skin:  [] Rashes   [] Ulcers   [] Wounds Psychological:  [] History of anxiety   []  History of major depression.  Physical Examination  Vitals:   07/02/22 1107 07/02/22 1330 07/02/22 1430 07/02/22 1623  BP:  (!) 138/59 (!) 120/52 (!) 153/92  Pulse:    88  Resp:  15 13 15   Temp: 97.6 F (36.4 C)   98.5 F (36.9 C)  TempSrc: Oral   Axillary  SpO2:    98%  Weight:    79.8 kg  Height:    5\' 7"  (1.702 m)   Body mass index is 27.55 kg/m. Gen:  WD/WN, NAD Pulmonary:  Good air movement, respirations not labored, equal bilaterally.  Cardiac: RRR, normal S1, S2. Vascular:  Vessel Right Left  Radial Palpable Palpable  Ulnar    Brachial    Carotid Palpable, without bruit Palpable, without bruit  Aorta Not palpable N/A  Femoral Palpable Palpable  Popliteal    PT    DP Palpable Palpable   Gastrointestinal: soft, non-tender/non-distended. No guarding/reflex.  Musculoskeletal: RIGHT lower extremity with 2+edema, soft, warm to toes, some discoloration of leg, however, no phlegmasia, prior laceration mid shin, , non tender. No deformity or atrophy. Neurologic: Sensation grossly intact in extremities.  Symmetrical.  Speech is fluent. Motor exam as listed above. Psychiatric: Dementia, quiet      CBC Lab Results  Component Value Date   WBC 8.8 07/02/2022   HGB 13.7 07/02/2022   HCT 43.4  07/02/2022   MCV 89.9 07/02/2022   PLT 245 07/02/2022    BMET    Component Value Date/Time   NA 141 07/02/2022 1106   NA 137 12/30/2021 0000   K 3.9 07/02/2022 1106   CL 106 07/02/2022 1106   CO2 29 07/02/2022 1106   GLUCOSE 103 (H) 07/02/2022 1106   BUN 19 07/02/2022 1106  BUN 19 12/30/2021 0000   CREATININE 1.16 07/02/2022 1106   CALCIUM 9.5 07/02/2022 1106   GFRNONAA >60 07/02/2022 1106   GFRAA 82 (L) 08/31/2013 1656   Estimated Creatinine Clearance: 48 mL/min (by C-G formula based on SCr of 1.16 mg/dL).  COAG Lab Results  Component Value Date   INR 1.0 07/02/2022   INR 1.2 12/18/2021   INR 1.2 06/14/2021    Radiology US Venous Img Lower Unilateral Right  Result Date: 07/02/2022 CLINICAL DATA:  Right lower extremity swelling. EXAM: RIGHT LOWER EXTREMITY VENOUS DOPPLER ULTRASOUND TECHNIQUE: Gray-scale sonography with compression, as well as color and duplex ultrasound, were performed to evaluate the deep venous system(s) from the level of the common femoral vein through the popliteal and proximal calf veins. COMPARISON:  None Available. FINDINGS: VENOUS Extensive occlusive deep venous thrombosis extends from the common femoral vein, involves the saphenofemoral junction and extends into the deep femoral vein. Occlusive thrombus extends throughout the femoral vein and popliteal vein extending into the posterior tibial veins. Peroneal vein not visualized. Limited views of the contralateral common femoral vein are unremarkable. OTHER None. Limitations: none IMPRESSION: 1. Extensive, acute right lower extremity deep venous thrombosis as detailed. Electronically Signed   By: Amie Portland M.D.   On: 07/02/2022 12:42      Assessment/Plan 1. RIGHT lower extremity DVT- extensive tibial- femoral 2. Heparin gtt 3. Will plan for thrombectomy on Monday or Tuesday   Eli Hose A, MD  07/02/2022 7:10 PM    This note was created with Dragon medical transcription system.  Any  error is purely unintentional

## 2022-07-02 NOTE — Telephone Encounter (Signed)
Nurse called from [facility] for acute Right Leg Swelling Has h/o DVT Dopplers stat ordered.Send to ED if Dopplers cannot be done

## 2022-07-02 NOTE — Consult Note (Signed)
ANTICOAGULATION CONSULT NOTE - Initial Consult  Pharmacy Consult for heparin Indication: DVT  Allergies  Allergen Reactions   Donepezil Other (See Comments)    Increased confusion Still listed on pt's current med list as of 06/14/2021   Sulfites     Unable to recall reaction or specific sulfa drug    Patient Measurements: Heparin Dosing Weight: 79.6  Vital Signs: Temp: 97.8 F (36.6 C) (06/01 2045) Temp Source: Oral (06/01 2045) BP: 109/71 (06/01 2045) Pulse Rate: 92 (06/01 2045)  Labs: Recent Labs    07/02/22 1106 07/02/22 2242  HGB 13.7  --   HCT 43.4  --   PLT 245  --   APTT 32  --   LABPROT 13.2  --   INR 1.0  --   HEPARINUNFRC <0.10* >1.10*  CREATININE 1.16  --      Estimated Creatinine Clearance: 48 mL/min (by C-G formula based on SCr of 1.16 mg/dL).   Medical History: Past Medical History:  Diagnosis Date   Acute cholecystitis    Acute metabolic encephalopathy    Alzheimer disease (HCC)    Anxiety    Aortic atherosclerosis (HCC)    Arthritis    Chronic kidney disease    L TOTAL NEPHRECTOMY   History of DVT (deep vein thrombosis)    Hypertension    Sepsis (HCC)     Medications:  No chronic anticoagulation currently, but patient was taking it in 2023  Assessment: 82 y.o. male with PMH Alzheimer's dementia, DVT (March 2023) who presents with RLE swelling. Ultrasound reveals extensive, acute right lower extremity deep venous thrombosis. Patient does not appear to be on Eliquis currently, however from chart review it appears he took Eliquis for a time period following his DVT in 2023, so will order a baseline heparin level to assess for recent DOAC use.  Goal of Therapy:  Heparin level 0.3-0.7 units/ml Monitor platelets by anticoagulation protocol: Yes   Baseline Labs: aPTT - 32; INR - 1.0; Hgb - 13.7; PLT - 245   Date Time aPTT/HL Rate/Comment 06/01 2242 HL > 1.10 Supratherapeutic     Plan:  Insurance claims handler.  Hold infusion for 1  hour. Restart heparin infusion at 1100 units/hr Recheck HL in 8 hrs after restart Continue to monitor H&H and platelets daily while on heparin gtt.  Otelia Sergeant, PharmD, [facility] 07/02/2022 11:39 PM

## 2022-07-02 NOTE — ED Provider Notes (Signed)
[facility] Provider Note    Event Date/Time   First MD Initiated Contact with Patient 07/02/22 1057     (approximate)   History   Leg Swelling   HPI  Robert Le is a 82 y.o. male with a history of Alzheimer's dementia sent from skilled nursing facility for evaluation of right leg swelling and concern for possible blood clot.  They apparently attempted to do an ultrasound there but were unable to do so.  Patient is unable to provide any history     Physical Exam   Triage Vital Signs: ED Triage Vitals  Enc Vitals Group     BP 07/02/22 1059 119/64     Pulse Rate 07/02/22 1059 91     Resp 07/02/22 1059 16     Temp 07/02/22 1107 97.6 F (36.4 C)     Temp Source 07/02/22 1059 Oral     SpO2 07/02/22 1059 95 %     Weight --      Height --      Head Circumference --      Peak Flow --      Pain Score --      Pain Loc --      Pain Edu? --      Excl. in GC? --     Most recent vital signs: Vitals:   07/02/22 1059 07/02/22 1107  BP: 119/64   Pulse: 91   Resp: 16   Temp:  97.6 F (36.4 C)  SpO2: 95%      General: Awake, no distress.  CV:  Good peripheral perfusion.  Resp:  Normal effort.  Abd:  No distention.  Other:  Mild swelling to the right leg, warm and well-perfused distally, 1+ TA pulses bilaterally, no tenderness to palpation   ED Results / Procedures / Treatments   Labs (all labs ordered are listed, but only abnormal results are displayed) Labs Reviewed  BASIC METABOLIC PANEL - Abnormal; Notable for the following components:      Result Value   Glucose, Bld 103 (*)    All other components within normal limits  CBC  APTT  PROTIME-INR     EKG     RADIOLOGY Ultrasound lower extremity    PROCEDURES:  Critical Care performed: yes  CRITICAL CARE Performed by: Jene Every   Total critical care time: 30 minutes  Critical care time was exclusive of separately billable procedures and treating other  patients.  Critical care was necessary to treat or prevent imminent or life-threatening deterioration.  Critical care was time spent personally by me on the following activities: development of treatment plan with patient and/or surrogate as well as nursing, discussions with consultants, evaluation of patient's response to treatment, examination of patient, obtaining history from patient or surrogate, ordering and performing treatments and interventions, ordering and review of laboratory studies, ordering and review of radiographic studies, pulse oximetry and re-evaluation of patient's condition.   Procedures   MEDICATIONS ORDERED IN ED: Medications  oxyCODONE-acetaminophen (PERCOCET/ROXICET) 5-325 MG per tablet 1 tablet (has no administration in time range)  acetaminophen (TYLENOL) tablet 650 mg (has no administration in time range)  ondansetron (ZOFRAN) injection 4 mg (has no administration in time range)  hydrALAZINE (APRESOLINE) injection 5 mg (has no administration in time range)     IMPRESSION / MDM / ASSESSMENT AND PLAN / ED COURSE  I reviewed the triage vital signs and the nursing notes. Patient's presentation is most consistent with acute presentation with  potential threat to life or bodily function.  Patient presents with right leg swelling as detailed above, differential includes edema, DVT.  Arterial pulses seem normal.  Will check labs, obtain ultrasound and reevaluate.  Ultrasound trace extensive occlusive thrombus in the right leg, discussed with Dr. Evie Lacks of vascular surgery who recommends heparin drip, admission for likely thrombectomy, I discussed with the hospitalist for admission      FINAL CLINICAL IMPRESSION(S) / ED DIAGNOSES   Final diagnoses:  Acute deep vein thrombosis (DVT) of right lower extremity, unspecified vein (HCC)     Rx / DC Orders   ED Discharge Orders     None        Note:  This document was prepared using Dragon voice recognition  software and may include unintentional dictation errors.   Jene Every, MD 07/02/22 1335

## 2022-07-02 NOTE — ED Notes (Signed)
Pt cleaned of urine/bowel incontinence, brief changed.

## 2022-07-02 NOTE — Consult Note (Addendum)
ANTICOAGULATION CONSULT NOTE - Initial Consult  Pharmacy Consult for heparin Indication: DVT  Allergies  Allergen Reactions   Donepezil Other (See Comments)    Increased confusion Still listed on pt's current med list as of 06/14/2021   Sulfites     Unable to recall reaction or specific sulfa drug    Patient Measurements: Heparin Dosing Weight: 79.6  Vital Signs: Temp: 97.6 F (36.4 C) (06/01 1107) Temp Source: Oral (06/01 1107) BP: 119/64 (06/01 1059) Pulse Rate: 91 (06/01 1059)  Labs: Recent Labs    07/02/22 1106  HGB 13.7  HCT 43.4  PLT 245  APTT 32  LABPROT 13.2  INR 1.0  CREATININE 1.16    Estimated Creatinine Clearance: 47.9 mL/min (by C-G formula based on SCr of 1.16 mg/dL).   Medical History: Past Medical History:  Diagnosis Date   Acute cholecystitis    Acute metabolic encephalopathy    Alzheimer disease (HCC)    Anxiety    Aortic atherosclerosis (HCC)    Arthritis    Chronic kidney disease    L TOTAL NEPHRECTOMY   History of DVT (deep vein thrombosis)    Hypertension    Sepsis (HCC)     Medications:  No chronic anticoagulation currently, but patient was taking it in 2023  Assessment: 82 y.o. male with PMH Alzheimer's dementia, DVT (March 2023) who presents with RLE swelling. Ultrasound reveals extensive, acute right lower extremity deep venous thrombosis. Patient does not appear to be on Eliquis currently, however from chart review it appears he took Eliquis for a time period following his DVT in 2023, so will order a baseline heparin level to assess for recent DOAC use.  Goal of Therapy:  Heparin level 0.3-0.7 units/ml Monitor platelets by anticoagulation protocol: Yes   Baseline Labs: aPTT - 32; INR - 1.0; Hgb - 13.7; PLT - 245   Date Time aPTT/HL Rate/Comment       Plan:  Give 5500 units bolus x1; then start heparin infusion at 1350 units/hr Check anti-Xa level in 8 hours and daily once consecutively therapeutic. Continue to  monitor H&H and platelets daily while on heparin gtt.  Will M. Dareen Piano, PharmD PGY-1 Pharmacy Resident 07/02/2022 1:58 PM

## 2022-07-02 NOTE — H&P (Signed)
History and Physical    Robert Le EAV:[id number] DOB: 1940/08/29 DOA: 07/02/2022  Referring MD/NP/PA:   PCP: Robert Mealing, MD   Patient coming from:  The patient is coming from SNF    Chief Complaint: right leg swelling   HPI: Robert Le is a 82 y.o. male with medical history significant of left leg DVT, HTN, dementia, depression with anxiety, s/p of left nephrectomy, wheelchair-bound (not auscultatory at baseline), who presents with right leg swelling  Patient has hx of dementia, and is unable to provide medical history, therefore, most of the history is obtained by discussing the case with ED physician, per EMS report, and with the nursing staff. I also called son by phone. Per his son, patient has been wheelchair-bound for more than 1 year.  Patient has history of dementia, with very limited mobility to communicate.  He can only speak few words.  At her normal baseline, patient knows his own name, not oriented to the place and time.  His mental status seems to be at baseline today when I saw pt in ED  Per report, pt was noted to have right leg swelling in facility, concerning for possible blood clot.  They apparently attempted to do an ultrasound there but were unable to do so. Pt is transported to ED for further evaluation and treatment.  When I saw patient in ED, he knows his own name, but not oriented to place and time.  His right leg is swollen, with mild purple discoloration. Pt seems to have right leg tenderness on touch.  Does not have respiratory distress, no active cough.  No active nausea, vomiting or diarrhea noted.  Patient does not seem to have chest pain or abdominal pain.  Not sure if patient has symptoms of UTI.  Data reviewed independently and ED Course: pt was found to have WBC 8.8, GFR> 60, hemoglobin 13.7, temperature normal, blood pressure 119/64, heart rate 91, RR 16, oxygen saturation 95% on room air.  Patient is admitted to MedSurg bed as inpatient.  LE  venous doppler of right leg showed extensive, acute right lower extremity deep venous thrombosis. Dr. Evie Lacks of [facility] is consulted.   EKG:  Not done in ED, will get one.      Review of Systems: Could not be reviewed due to dementia   Allergy:  Allergies  Allergen Reactions   Donepezil Other (See Comments)    Increased confusion Still listed on pt's current med list as of 06/14/2021   Sulfites     Unable to recall reaction or specific sulfa drug    Past Medical History:  Diagnosis Date   Acute cholecystitis    Acute metabolic encephalopathy    Alzheimer disease (HCC)    Anxiety    Aortic atherosclerosis (HCC)    Arthritis    Chronic kidney disease    L TOTAL NEPHRECTOMY   History of DVT (deep vein thrombosis)    Hypertension    Sepsis (HCC)     Past Surgical History:  Procedure Laterality Date   FRACTURE SURGERY     FRACTURED HIP SURGERY 1960   IR EXCHANGE BILIARY DRAIN  06/10/2021   IR FLUORO PROCEDURE UNLISTED  07/13/2021   IR PERC CHOLECYSTOSTOMY  04/17/2021   NEPHRECTOMY  1985   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   TOTAL HIP ARTHROPLASTY Left 08/20/2013   Procedure: LEFT TOTAL HIP ARTHROPLASTY ANTERIOR APPROACH;  Surgeon: Shelda Pal, MD;  Location: [facility];  Service: Orthopedics;  Laterality:  Left;    Social History:  reports that he has never smoked. He has never used smokeless tobacco. He reports that he does not drink alcohol and does not use drugs.  Family History:  Family History  Problem Relation Age of Onset   Heart disease Father    Arthritis Brother    Arthritis Brother    Cancer Neg Hx    Diabetes Neg Hx      Prior to Admission medications   Medication Sig Start Date End Date Taking? Authorizing Provider  acetaminophen (TYLENOL) 325 MG tablet Take 650 mg by mouth every 6 (six) hours as needed.    [provider]  amLODipine (NORVASC) 5 MG tablet Take 5 mg by mouth daily.    [provider]  carboxymethylcellulose (ARTIFICIAL TEARS)  1 % ophthalmic solution Apply 1 drop to eye 2 (two) times daily as needed.    [provider]  DULoxetine (CYMBALTA) 60 MG capsule Take 60 mg by mouth daily.    [provider]  memantine (NAMENDA) 10 MG tablet Take 10 mg by mouth 2 (two) times daily. 02/23/21   [provider]  Multiple Vitamin (THERA-TABS PO) Take 1 tablet by mouth daily at 6 (six) AM.    [provider]  OXYGEN 2lpm as needed to keep O2 saturation at 90% or greater.    [provider]  senna-docusate (SENNA-S) 8.6-50 MG tablet Take 1 tablet by mouth 2 (two) times daily.    [provider]    Physical Exam: Vitals:   07/02/22 1059 07/02/22 1107  BP: 119/64   Pulse: 91   Resp: 16   Temp:  97.6 F (36.4 C)  TempSrc: Oral Oral  SpO2: 95%    General: Not in acute distress HEENT:       Eyes: PERRL, EOMI, no jaundice       ENT: No discharge from the ears and nose.       Neck: No JVD, no bruit, no mass felt. Heme: No neck lymph node enlargement. Cardiac: S1/S2, RRR, No murmurs, No gallops or rubs. Respiratory: No rales, wheezing, rhonchi or rubs. GI: Soft, nondistended, nontender, no organomegaly, BS present. GU: No hematuria Ext: Left leg looks normal.  Has 2+ right leg swelling, mild purple discoloration, with faint DP pulse. Musculoskeletal: No joint deformities, No joint redness or warmth, no limitation of ROM in spin. Skin: No rashes.  Neuro: confused, knows his own name, not oriented to place and time. Cranial nerves II-XII grossly intact, moves both arms and slightly moves both legs.  Psych: Patient is not psychotic, no suicidal or hemocidal ideation.  Labs on Admission: I have personally reviewed following labs and imaging studies  CBC: Recent Labs  Lab 07/02/22 1106  WBC 8.8  HGB 13.7  HCT 43.4  MCV 89.9  PLT 245   Basic Metabolic Panel: Recent Labs  Lab 07/02/22 1106  NA 141  K 3.9  CL 106  CO2 29  GLUCOSE 103*  BUN 19  CREATININE 1.16   CALCIUM 9.5   GFR: Estimated Creatinine Clearance: 47.9 mL/min (by C-G formula based on SCr of 1.16 mg/dL). Liver Function Tests: No results for input(s): "AST", "ALT", "ALKPHOS", "BILITOT", "PROT", "ALBUMIN" in the last 168 hours. No results for input(s): "LIPASE", "AMYLASE" in the last 168 hours. No results for input(s): "AMMONIA" in the last 168 hours. Coagulation Profile: Recent Labs  Lab 07/02/22 1106  INR 1.0   Cardiac Enzymes: No results for input(s): "CKTOTAL", "CKMB", "CKMBINDEX", "  TROPONINI" in the last 168 hours. BNP (last 3 results) No results for input(s): "PROBNP" in the last 8760 hours. HbA1C: No results for input(s): "HGBA1C" in the last 72 hours. CBG: No results for input(s): "GLUCAP" in the last 168 hours. Lipid Profile: No results for input(s): "CHOL", "HDL", "LDLCALC", "TRIG", "CHOLHDL", "LDLDIRECT" in the last 72 hours. Thyroid Function Tests: No results for input(s): "TSH", "T4TOTAL", "FREET4", "T3FREE", "THYROIDAB" in the last 72 hours. Anemia Panel: No results for input(s): "VITAMINB12", "FOLATE", "FERRITIN", "TIBC", "IRON", "RETICCTPCT" in the last 72 hours. Urine analysis:    Component Value Date/Time   COLORURINE YELLOW (A) 12/18/2021 1907   APPEARANCEUR HAZY (A) 12/18/2021 1907   LABSPEC 1.025 12/18/2021 1907   PHURINE 5.0 12/18/2021 1907   GLUCOSEU NEGATIVE 12/18/2021 1907   HGBUR NEGATIVE 12/18/2021 1907   BILIRUBINUR NEGATIVE 12/18/2021 1907   KETONESUR NEGATIVE 12/18/2021 1907   PROTEINUR NEGATIVE 12/18/2021 1907   UROBILINOGEN 0.2 08/31/2013 1723   NITRITE NEGATIVE 12/18/2021 1907   LEUKOCYTESUR NEGATIVE 12/18/2021 1907   Sepsis Labs: @LABRCNTIP (procalcitonin:4,lacticidven:4) )No results found for this or any previous visit (from the past 240 hour(s)).   Radiological Exams on Admission: US Venous Img Lower Unilateral Right  Result Date: 07/02/2022 CLINICAL DATA:  Right lower extremity swelling. EXAM: RIGHT LOWER EXTREMITY VENOUS  DOPPLER ULTRASOUND TECHNIQUE: Gray-scale sonography with compression, as well as color and duplex ultrasound, were performed to evaluate the deep venous system(s) from the level of the common femoral vein through the popliteal and proximal calf veins. COMPARISON:  None Available. FINDINGS: VENOUS Extensive occlusive deep venous thrombosis extends from the common femoral vein, involves the saphenofemoral junction and extends into the deep femoral vein. Occlusive thrombus extends throughout the femoral vein and popliteal vein extending into the posterior tibial veins. Peroneal vein not visualized. Limited views of the contralateral common femoral vein are unremarkable. OTHER None. Limitations: none IMPRESSION: 1. Extensive, acute right lower extremity deep venous thrombosis as detailed. Electronically Signed   By: Amie Portland M.D.   On: 07/02/2022 12:42      Assessment/Plan Principal Problem:   DVT (deep venous thrombosis) (HCC) Active Problems:   Alzheimer's disease, unspecified (HCC)   Essential (primary) hypertension   Depression with anxiety   Assessment and Plan:   DVT (deep venous thrombosis) (HCC): Lower extremity venous Doppler of right leg showed extensive DVT.  Patient does not seem to have chest pain.  Oxygen saturation normal 95% on room air.  Heart rate 91, low suspicions for PE. Consulted Dr. Evie Lacks of [facility], planning to do thrombectomy on Monday.  -admit to med-surg bed as inpt -heparin drip initiated -pain control: prn Percocet and tyleno for pain -check INR/PTT  Alzheimer's disease, unspecified (HCC) -fall precaution -Namenda  Essential (primary) hypertension -IV hydralazine as needed -Amlodipine  Depression with anxiety -Continue home medications      DVT ppx: IV Heparin  Code Status: DNR (pt has DNR form form from facility), confirmed with his SON who is POA  Family Communication:   Yes, patient's son by phone  Disposition Plan:  Anticipate discharge back to  previous environment, SNF  Consults called:  Dr. Evie Lacks of [facility]  Admission status and Level of care: Med-Surg:   as inpt     Dispo: The patient is from: SNF              Anticipated d/c is to: SNF              Anticipated d/c date is: 2 days  Patient currently is not medically stable to d/c.    Severity of Illness:  The appropriate patient status for this patient is INPATIENT. Inpatient status is judged to be reasonable and necessary in order to provide the required intensity of service to ensure the patient's safety. The patient's presenting symptoms, physical exam findings, and initial radiographic and laboratory data in the context of their chronic comorbidities is felt to place them at high risk for further clinical deterioration. Furthermore, it is not anticipated that the patient will be medically stable for discharge from the hospital within 2 midnights of admission.   * I certify that at the point of admission it is my clinical judgment that the patient will require inpatient hospital care spanning beyond 2 midnights from the point of admission due to high intensity of service, high risk for further deterioration and high frequency of surveillance required.*       Date of Service 07/02/2022    Lorretta Harp [facility]   If 7PM-7AM, please contact night-coverage [email or web address] 07/02/2022, 2:09 PM

## 2022-07-03 DIAGNOSIS — F0280 Dementia in other diseases classified elsewhere without behavioral disturbance: Secondary | ICD-10-CM | POA: Diagnosis not present

## 2022-07-03 DIAGNOSIS — G309 Alzheimer's disease, unspecified: Secondary | ICD-10-CM | POA: Diagnosis not present

## 2022-07-03 DIAGNOSIS — I82401 Acute embolism and thrombosis of unspecified deep veins of right lower extremity: Secondary | ICD-10-CM | POA: Insufficient documentation

## 2022-07-03 DIAGNOSIS — I10 Essential (primary) hypertension: Secondary | ICD-10-CM | POA: Diagnosis not present

## 2022-07-03 DIAGNOSIS — I824Y1 Acute embolism and thrombosis of unspecified deep veins of right proximal lower extremity: Secondary | ICD-10-CM | POA: Diagnosis not present

## 2022-07-03 LAB — BASIC METABOLIC PANEL
Anion gap: 8 (ref 5–15)
BUN: 18 mg/dL (ref 8–23)
CO2: 25 mmol/L (ref 22–32)
Calcium: 9.1 mg/dL (ref 8.9–10.3)
Chloride: 106 mmol/L (ref 98–111)
Creatinine, Ser: 1.06 mg/dL (ref 0.61–1.24)
GFR, Estimated: >60 mL/min (ref >60)
Glucose, Bld: 100 mg/dL — ABNORMAL HIGH (ref 70–99)
Potassium: 3.8 mmol/L (ref 3.5–5.1)
Sodium: 139 mmol/L (ref 135–145)

## 2022-07-03 LAB — CBC
HCT: 40.6 % (ref 39.0–52.0)
Hemoglobin: 13.1 g/dL (ref 13.0–17.0)
MCH: 28.5 pg (ref 26.0–34.0)
MCHC: 32.3 g/dL (ref 30.0–36.0)
MCV: 88.3 fL (ref 80.0–100.0)
Platelets: 222 10*3/uL (ref 150–400)
RBC: 4.6 MIL/uL (ref 4.22–5.81)
RDW: 14.8 % (ref 11.5–15.5)
WBC: 9 10*3/uL (ref 4.0–10.5)
nRBC: 0 % (ref 0.0–0.2)

## 2022-07-03 LAB — HEPARIN LEVEL (UNFRACTIONATED)
Heparin Unfractionated: 0.73 IU/mL — ABNORMAL HIGH (ref 0.30–0.70)
Heparin Unfractionated: 0.65 IU/mL (ref 0.30–0.70)

## 2022-07-03 MED ORDER — ENSURE ENLIVE PO LIQD
237.0000 mL | Freq: Two times a day (BID) | ORAL | Status: DC
  Administered 2022-07-03 – 2022-07-04 (×3): 237 mL via ORAL

## 2022-07-03 MED ORDER — ORAL CARE MOUTH RINSE: 15.0000 mL | OROMUCOSAL | Status: DC | PRN

## 2022-07-03 NOTE — Progress Notes (Signed)
  Progress Note   Patient: Robert Le:[id number] DOB: December 01, 1940 DOA: 07/02/2022     1 DOS: the patient was seen and examined on 07/03/2022   Brief hospital course: Robert Le is a 82 y.o. male with medical history significant of left leg DVT, HTN, dementia, depression with anxiety, s/p of left nephrectomy, wheelchair-bound (not auscultatory at baseline), who presents with right leg swelling  Patient is placed on heparin drip, seen by vascular surgery, scheduled for thrombectomy on Monday.   Principal Problem:   DVT (deep venous thrombosis) (HCC) Active Problems:   Alzheimer's disease, unspecified (HCC)   Essential (primary) hypertension   Depression with anxiety   Assessment and Plan:  DVT (deep venous thrombosis) (HCC):  Continue heparin drip, scheduled for thrombectomy tomorrow. Based on record, patient appears not taking Eliquis chronically.  Patient will need lifelong anticoagulation at this point.    Alzheimer's disease, unspecified (HCC) Resume home medicines.   Essential (primary) hypertension Continue amlodipine.   Depression with anxiety Continue home medications       Subjective:  Patient seems to have a good appetite today, confused and pleasant.  Physical Exam: Vitals:   07/02/22 1623 07/02/22 2045 07/03/22 0441 07/03/22 0813  BP: (!) 153/92 109/71 (!) 119/54 (!) 139/111  Pulse: 88 92 90 95  Resp: 15 16 17 16   Temp: 98.5 F (36.9 C) 97.8 F (36.6 C) 97.9 F (36.6 C) 98.2 F (36.8 C)  TempSrc: Axillary Oral Oral   SpO2: 98% 98% 96% 99%  Weight: 79.8 kg     Height: 5\' 7"  (1.702 m)      General exam: Appears calm and comfortable  Respiratory system: Clear to auscultation. Respiratory effort normal. Cardiovascular system: S1 & S2 heard, RRR. No JVD, murmurs, rubs, gallops or clicks. No pedal edema. Gastrointestinal system: Abdomen is nondistended, soft and nontender. No organomegaly or masses felt. Normal bowel sounds heard. Central  nervous system: Alert and confused. Extremities: Symmetric 5 x 5 power. Skin: No rashes, lesions or ulcers Psychiatry: Mood & affect appropriate.    Data Reviewed:  Reviewed ultrasound results and lab results.  Family Communication: None  Disposition: Status is: Inpatient Remains inpatient appropriate because: Severity of disease, IV treatment.     Time spent: 35 minutes  Author: [name], MD 07/03/2022 10:42 AM  For on call review [email or web address].

## 2022-07-03 NOTE — Hospital Course (Signed)
Robert Le is a 82 y.o. male with medical history significant of left leg DVT, HTN, dementia, depression with anxiety, s/p of left nephrectomy, wheelchair-bound (not auscultatory at baseline), who presents with right leg swelling  Patient is placed on heparin drip, seen by vascular surgery, scheduled for thrombectomy on Monday.

## 2022-07-03 NOTE — TOC Initial Note (Addendum)
Transition of Care [facility]) - Initial/Assessment Note    Patient Details  Name: Robert Le MRN: [id number] Date of Birth: [birth year]  [facility] CM/SW Contact:    Liliana Cline, LCSW Phone Number: 07/03/2022, 11:49 AM  Clinical Narrative:                 Patient resides at [facility] in their Memory Care ALF Unit. Confirmed with Sue Lush at [facility] that patient can return when medically ready. Will need FL2 with DC Medication list at time of DC.   Expected Discharge Plan: Memory Care Barriers to Discharge: Continued Medical Work up   Patient Goals and CMS Choice            Expected Discharge Plan and Services                                              Prior Living Arrangements/Services   Lives with:: Facility Resident                   Activities of Daily Living Home Assistive Devices/Equipment: Wheelchair ADL Screening (condition at time of admission) Patient's cognitive ability adequate to safely complete daily activities?: No Is the patient deaf or have difficulty hearing?: No Does the patient have difficulty seeing, even when wearing glasses/contacts?: No Does the patient have difficulty concentrating, remembering, or making decisions?: Yes Patient able to express need for assistance with ADLs?: No Does the patient have difficulty dressing or bathing?: Yes Independently performs ADLs?: No Communication: Dependent Is this a change from baseline?: Pre-admission baseline Dressing (OT): Dependent Is this a change from baseline?: Pre-admission baseline Grooming: Dependent Is this a change from baseline?: Pre-admission baseline Feeding: Dependent Is this a change from baseline?: Pre-admission baseline Bathing: Dependent Is this a change from baseline?: Pre-admission baseline Toileting: Dependent Is this a change from baseline?: Pre-admission baseline In/Out Bed: Dependent Is this a change from baseline?: Pre-admission  baseline Walks in Home: Dependent Is this a change from baseline?: Pre-admission baseline Does the patient have difficulty walking or climbing stairs?: Yes Weakness of Legs: Both Weakness of Arms/Hands: Both  Permission Sought/Granted                  Emotional Assessment              Admission diagnosis:  DVT (deep venous thrombosis) (HCC) [I82.409] Acute deep vein thrombosis (DVT) of right lower extremity, unspecified vein (HCC) [I82.401] Patient Active Problem List   Diagnosis Date Noted   Leg DVT (deep venous thromboembolism), acute, right (HCC) 07/03/2022   DVT (deep venous thrombosis) (HCC) 07/02/2022   Depression with anxiety 07/02/2022   Community acquired bilateral lower lobe pneumonia 12/18/2021   Need for assistance with personal care 08/25/2021   Other symptoms and signs involving cognitive functions and awareness 08/25/2021   History of DVT (deep vein thrombosis) 06/15/2021   Muscle weakness (generalized) 04/29/2021   Acute cholecystitis 04/17/2021   Sepsis, unspecified organism (HCC) 04/17/2021   Metabolic encephalopathy 04/17/2021   Other abnormalities of gait and mobility 03/30/2021   Other lack of coordination 03/30/2021   Unsteadiness on feet 03/30/2021   Essential (primary) hypertension 12/29/2018   Chronic kidney disease, unspecified 12/29/2018   Anxiety disorder, unspecified 12/29/2018   Unspecified osteoarthritis, unspecified site 12/29/2018   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   Alzheimer's disease, unspecified (HCC) 12/21/2015  Osteoarthritis of finger of right hand 07/17/2015   Primary osteoarthritis of first carpometacarpal joint of right hand 07/17/2015   Right hand pain 07/17/2015   History of hypertension 04/21/2015   Insomnia 04/21/2015   PCP:  Earnestine Mealing, MD Pharmacy:   [facility] [phone] - [place], [place] - [address] AT [facility] [address] & [facility] [address] [place] [address] Phone:  [phone] Fax: [phone]  [facility] #[id number] [place], [place] - [address] AT [facility] [address] [place] [address] Phone: [phone] Fax: [phone]  [facility] - [place], [place] - [address] [place] [address] Phone: [phone] Fax: [phone]     Social Determinants of Health (SDOH) Social History: SDOH Screenings   Food Insecurity: No Food Insecurity (07/02/2022)  Housing: Low Risk  (07/02/2022)  Transportation Needs: No Transportation Needs (07/02/2022)  Utilities: Not At Risk (07/02/2022)  Tobacco Use: Low Risk  (07/02/2022)   SDOH Interventions:     Readmission Risk Interventions     No data to display

## 2022-07-03 NOTE — Consult Note (Signed)
ANTICOAGULATION CONSULT NOTE -   Pharmacy Consult for heparin Indication: DVT  Allergies  Allergen Reactions   Donepezil Other (See Comments)    Increased confusion Still listed on pt's current med list as of 06/14/2021   Sulfites     Unable to recall reaction or specific sulfa drug    Patient Measurements: Heparin Dosing Weight: 79.6  Vital Signs: Temp: 98.2 F (36.8 C) (06/02 0813) Temp Source: Oral (06/02 0441) BP: 139/111 (06/02 0813) Pulse Rate: 95 (06/02 0813)  Labs: Recent Labs    07/02/22 1106 07/02/22 2242 07/03/22 0434 07/03/22 0853  HGB 13.7  --  13.1  --   HCT 43.4  --  40.6  --   PLT 245  --  222  --   APTT 32  --   --   --   LABPROT 13.2  --   --   --   INR 1.0  --   --   --   HEPARINUNFRC <0.10* >1.10*  --  0.73*  CREATININE 1.16  --  1.06  --      Estimated Creatinine Clearance: 52.5 mL/min (by C-G formula based on SCr of 1.06 mg/dL).   Medical History: Past Medical History:  Diagnosis Date   Acute cholecystitis    Acute metabolic encephalopathy    Alzheimer disease (HCC)    Anxiety    Aortic atherosclerosis (HCC)    Arthritis    Chronic kidney disease    L TOTAL NEPHRECTOMY   History of DVT (deep vein thrombosis)    Hypertension    Sepsis (HCC)     Medications:  No chronic anticoagulation currently, but patient was taking it in 2023  Assessment: 82 y.o. male with PMH Alzheimer's dementia, DVT (March 2023) who presents with RLE swelling. Ultrasound reveals extensive, acute right lower extremity deep venous thrombosis. Patient does not appear to be on Eliquis currently, however from chart review it appears he took Eliquis for a time period following his DVT in 2023, so will order a baseline heparin level to assess for recent DOAC use.  Goal of Therapy:  Heparin level 0.3-0.7 units/ml Monitor platelets by anticoagulation protocol: Yes   Baseline Labs: aPTT - 32; INR - 1.0; Hgb - 13.7; PLT -  245   Date Time HL  Rate/Comment 06/01 2242 HL > 1.10 Supratherapeutic 0602 0853 HL 0.73 Slightly supratherapeutic, decrease to 1000 u/hr     Plan:  0602 0853  HL 0.73   Slightly supratherapeutic,  decrease heparin infusion to 1000 units/hr Recheck HL in 8 hrs after rate change Continue to monitor H&H and platelets daily while on heparin gtt.  Bari Mantis PharmD Clinical Pharmacist 07/03/2022

## 2022-07-03 NOTE — Consult Note (Signed)
ANTICOAGULATION CONSULT NOTE -   Pharmacy Consult for heparin Indication: DVT  Allergies  Allergen Reactions   Donepezil Other (See Comments)    Increased confusion Still listed on pt's current med list as of 06/14/2021   Sulfites     Unable to recall reaction or specific sulfa drug    Patient Measurements: Heparin Dosing Weight: 79.6  Vital Signs: Temp: 98 F (36.7 C) (06/02 1634) BP: 129/70 (06/02 1634) Pulse Rate: 86 (06/02 1634)  Labs: Recent Labs    07/02/22 1106 07/02/22 2242 07/03/22 0434 07/03/22 0853 07/03/22 1851  HGB 13.7  --  13.1  --   --   HCT 43.4  --  40.6  --   --   PLT 245  --  222  --   --   APTT 32  --   --   --   --   LABPROT 13.2  --   --   --   --   INR 1.0  --   --   --   --   HEPARINUNFRC <0.10* >1.10*  --  0.73* 0.65  CREATININE 1.16  --  1.06  --   --      Estimated Creatinine Clearance: 52.5 mL/min (by C-G formula based on SCr of 1.06 mg/dL).   Medical History: Past Medical History:  Diagnosis Date   Acute cholecystitis    Acute metabolic encephalopathy    Alzheimer disease (HCC)    Anxiety    Aortic atherosclerosis (HCC)    Arthritis    Chronic kidney disease    L TOTAL NEPHRECTOMY   History of DVT (deep vein thrombosis)    Hypertension    Sepsis (HCC)     Medications:  No chronic anticoagulation currently, but patient was taking it in 2023  Assessment: 82 y.o. male with PMH Alzheimer's dementia, DVT (March 2023) who presents with RLE swelling. Ultrasound reveals extensive, acute right lower extremity deep venous thrombosis. Patient does not appear to be on Eliquis currently, however from chart review it appears he took Eliquis for a time period following his DVT in 2023, so will order a baseline heparin level to assess for recent DOAC use.  Goal of Therapy:  Heparin level 0.3-0.7 units/ml Monitor platelets by anticoagulation protocol: Yes   Baseline Labs: aPTT - 32; INR - 1.0; Hgb - 13.7; PLT -  245   Date Time HL  Rate/Comment 06/01 2242 HL > 1.10 Supratherapeutic 0602 0853 HL 0.73 Slightly supratherapeutic, decrease to 1000 u/hr 06/02   1851   HL 0.65 Therapeutic x 1    Plan:  0602 1851  HL 0.65   therapeutic x 1  continue heparin infusion at 1000 units/hr Recheck HL in 8 hrs for confirmation Continue to monitor H&H and platelets daily while on heparin gtt.  Clovia Cuff, PharmD, BCPS 07/03/2022 7:44 PM

## 2022-07-04 ENCOUNTER — Telehealth: Payer: Self-pay | Admitting: Student

## 2022-07-04 ENCOUNTER — Other Ambulatory Visit (HOSPITAL_COMMUNITY): Payer: Self-pay

## 2022-07-04 DIAGNOSIS — I10 Essential (primary) hypertension: Secondary | ICD-10-CM | POA: Diagnosis not present

## 2022-07-04 DIAGNOSIS — I82401 Acute embolism and thrombosis of unspecified deep veins of right lower extremity: Secondary | ICD-10-CM

## 2022-07-04 DIAGNOSIS — F0280 Dementia in other diseases classified elsewhere without behavioral disturbance: Secondary | ICD-10-CM | POA: Diagnosis not present

## 2022-07-04 DIAGNOSIS — G309 Alzheimer's disease, unspecified: Secondary | ICD-10-CM | POA: Diagnosis not present

## 2022-07-04 LAB — CBC
HCT: 38.3 % — ABNORMAL LOW (ref 39.0–52.0)
Hemoglobin: 12.5 g/dL — ABNORMAL LOW (ref 13.0–17.0)
MCH: 28.2 pg (ref 26.0–34.0)
MCHC: 32.6 g/dL (ref 30.0–36.0)
MCV: 86.3 fL (ref 80.0–100.0)
Platelets: 227 10*3/uL (ref 150–400)
RBC: 4.44 MIL/uL (ref 4.22–5.81)
RDW: 14.3 % (ref 11.5–15.5)
WBC: 10.2 10*3/uL (ref 4.0–10.5)
nRBC: 0 % (ref 0.0–0.2)

## 2022-07-04 LAB — HEPARIN LEVEL (UNFRACTIONATED): Heparin Unfractionated: 0.55 IU/mL (ref 0.30–0.70)

## 2022-07-04 MED ORDER — APIXABAN 5 MG PO TABS: 5.0000 mg | ORAL_TABLET | Freq: Two times a day (BID) | ORAL | Status: DC

## 2022-07-04 MED ORDER — APIXABAN (ELIQUIS) VTE STARTER PACK (10MG AND 5MG): ORAL_TABLET | ORAL | 0 refills | Status: DC

## 2022-07-04 MED ORDER — APIXABAN 5 MG PO TABS
10.0000 mg | ORAL_TABLET | Freq: Two times a day (BID) | ORAL | Status: DC
  Administered 2022-07-04: 10 mg via ORAL
  Filled 2022-07-04: qty 2

## 2022-07-04 NOTE — Consult Note (Signed)
ANTICOAGULATION CONSULT NOTE  Pharmacy Consult for Apixaban Indication: DVT  Labs: Recent Labs    07/02/22 1106 07/02/22 2242 07/03/22 0434 07/03/22 0853 07/03/22 1851 07/04/22 0315  HGB 13.7  --  13.1  --   --  12.5*  HCT 43.4  --  40.6  --   --  38.3*  PLT 245  --  222  --   --  227  APTT 32  --   --   --   --   --   LABPROT 13.2  --   --   --   --   --   INR 1.0  --   --   --   --   --   HEPARINUNFRC <0.10*   < >  --  0.73* 0.65 0.55  CREATININE 1.16  --  1.06  --   --   --    < > = values in this interval not displayed.    Estimated Creatinine Clearance: 52.5 mL/min (by C-G formula based on SCr of 1.06 mg/dL).  Medical History: Past Medical History:  Diagnosis Date   Acute cholecystitis    Acute metabolic encephalopathy    Alzheimer disease (HCC)    Anxiety    Aortic atherosclerosis (HCC)    Arthritis    Chronic kidney disease    L TOTAL NEPHRECTOMY   History of DVT (deep vein thrombosis)    Hypertension    Sepsis (HCC)    Medications:  No chronic anticoagulation currently, but patient was taking it in 2023  Assessment: 82 y.o. male with PMH Alzheimer's dementia, DVT (March 2023) who presents with RLE swelling. Ultrasound reveals extensive, acute right lower extremity deep venous thrombosis. Patient does not appear to be on Eliquis currently, however from chart review it appears he took Eliquis for a time period following his DVT in 2023. Pharmacy consulted to transition IV heparin to apixaban for DVT treatment.  Plan:  --Discontinue heparin infusion --Start apixaban 10 mg BID x 7 days followed by apixaban 5 mg BID for remaining duration of therapy --CBC per protocol while inpatient  Tressie Ellis 07/04/2022 8:57 AM

## 2022-07-04 NOTE — TOC Transition Note (Signed)
Transition of Care [facility]) - CM/SW Discharge Note   Patient Details  Name: Robert Le MRN: [id number] Date of Birth: Jun 22, 1940  [facility] CM/SW Contact:  Chapman Fitch, RN Phone Number: 07/04/2022, 3:40 PM   Clinical Narrative:      Patient will DC to: [facility] memory care Anticipated DC date: 07/04/22  Family notified: son jeff Transport EX:[id number]  Per MD patient ready for DC to . RN, , patient's family, and facility notified of DC. Discharge Summary and fl2  faxed  to facility. RN given number for report. DC packet on chart. Ambulance transport requested for patient.  TOC signing off.  Bevelyn Ngo [facility] [phone]    Barriers to Discharge: Continued Medical Work up   Patient Goals and CMS Choice      Discharge Placement                         Discharge Plan and Services Additional resources added to the After Visit Summary for                                       Social Determinants of Health (SDOH) Interventions SDOH Screenings   Food Insecurity: No Food Insecurity (07/02/2022)  Housing: Low Risk  (07/02/2022)  Transportation Needs: No Transportation Needs (07/02/2022)  Utilities: Not At Risk (07/02/2022)  Tobacco Use: Low Risk  (07/02/2022)     Readmission Risk Interventions     No data to display

## 2022-07-04 NOTE — Progress Notes (Signed)
EMS notified for transport. Report called to RN at [name]

## 2022-07-04 NOTE — NC FL2 (Signed)
[place] MEDICAID FL2 LEVEL OF CARE FORM     IDENTIFICATION  Patient Name: Robert Le Birthdate: [birth year] Sex: male Admission Date (Current Location): 07/02/2022  [facility] and [place] Number:  Chiropodist and Address:         Provider Number: [id number]  Attending Physician Name and Address:  [name], MD  Relative Name and Phone Number:       Current Level of Care: Other (Comment) (ALF memory care) Recommended Level of Care: Assisted Living Facility, Memory Care Prior Approval Number:    Date Approved/Denied:   PASRR Number:    Discharge Plan: Other (Comment) (Memory Care ALF)    Current Diagnoses: Patient Active Problem List   Diagnosis Date Noted   Leg DVT (deep venous thromboembolism), acute, right (HCC) 07/03/2022   DVT (deep venous thrombosis) (HCC) 07/02/2022   Depression with anxiety 07/02/2022   Community acquired bilateral lower lobe pneumonia 12/18/2021   Need for assistance with personal care 08/25/2021   Other symptoms and signs involving cognitive functions and awareness 08/25/2021   History of DVT (deep vein thrombosis) 06/15/2021   Muscle weakness (generalized) 04/29/2021   Acute cholecystitis 04/17/2021   Sepsis, unspecified organism (HCC) 04/17/2021   Metabolic encephalopathy 04/17/2021   Other abnormalities of gait and mobility 03/30/2021   Other lack of coordination 03/30/2021   Unsteadiness on feet 03/30/2021   Essential (primary) hypertension 12/29/2018   Chronic kidney disease, unspecified 12/29/2018   Anxiety disorder, unspecified 12/29/2018   Unspecified osteoarthritis, unspecified site 12/29/2018   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   Alzheimer's disease, unspecified (HCC) 12/21/2015   Osteoarthritis of finger of right hand 07/17/2015   Primary osteoarthritis of first carpometacarpal joint of right hand 07/17/2015   Right hand pain 07/17/2015   History of hypertension 04/21/2015   Insomnia 04/21/2015     Orientation RESPIRATION BLADDER Height & Weight        Normal Incontinent Weight: 79.8 kg Height:  5\' 7"  (170.2 cm)  BEHAVIORAL SYMPTOMS/MOOD NEUROLOGICAL BOWEL NUTRITION STATUS      Incontinent Diet (heart health)  AMBULATORY STATUS COMMUNICATION OF NEEDS Skin   Extensive Assist Verbally Skin abrasions                       Personal Care Assistance Level of Assistance              Functional Limitations Info             SPECIAL CARE FACTORS FREQUENCY                       Contractures Contractures Info: Not present    Additional Factors Info  Code Status Code Status Info: DNR             Medication List       TAKE these medications     acetaminophen 325 MG tablet Commonly known as: TYLENOL Take 650 mg by mouth every 6 (six) hours as needed.    amLODipine 5 MG tablet Commonly known as: NORVASC Take 5 mg by mouth daily.    Apixaban Starter Pack (10mg  and 5mg ) Commonly known as: ELIQUIS STARTER PACK Take as directed on package: start with two-5mg  tablets twice daily for 7 days. On day 8, switch to one-5mg  tablet twice daily.    Artificial Tears 1 % ophthalmic solution Generic drug: carboxymethylcellulose Apply 1 drop to eye 2 (two) times daily as needed.    DULoxetine 60 MG capsule  Commonly known as: CYMBALTA Take 60 mg by mouth daily.    memantine 10 MG tablet Commonly known as: NAMENDA Take 10 mg by mouth 2 (two) times daily.    OXYGEN 2lpm as needed to keep O2 saturation at 90% or greater.    Senna-S 8.6-50 MG tablet Generic drug: senna-docusate Take 1 tablet by mouth 2 (two) times daily.    THERA-TABS PO Take 1 tablet by mouth daily at 6 (six) AM.   Relevant Imaging Results:  Relevant Lab Results:   Additional Information SS#: [id number].  Chapman Fitch, RN

## 2022-07-04 NOTE — Telephone Encounter (Signed)
Called patient's son with concern that patient's current state of alzheimer's and age makes him high risk for a thrombectomy. That this surgery would not be without risk and could potentially be more harm than good. He asked that I speak to the inpatient hospital ist as well and if we both agree that we should forgo surgery and treat the clot medically with anticoagulation.   Dr. Chipper Herb also spoke with son who states we can forgo surgery and he return to the facility. Plan to continue Eliquis indefinitely. Leg will be swollen for a long time, but adequate treatment with eliquis is safer for patient at this time.

## 2022-07-04 NOTE — Consult Note (Signed)
ANTICOAGULATION CONSULT NOTE -   Pharmacy Consult for heparin Indication: DVT  Allergies  Allergen Reactions   Donepezil Other (See Comments)    Increased confusion Still listed on pt's current med list as of 06/14/2021   Sulfites     Unable to recall reaction or specific sulfa drug    Patient Measurements: Heparin Dosing Weight: 79.6  Vital Signs: Temp: 98 F (36.7 C) (06/02 1936) BP: 136/91 (06/02 1936) Pulse Rate: 86 (06/02 1936)  Labs: Recent Labs    07/02/22 1106 07/02/22 2242 07/03/22 0434 07/03/22 0853 07/03/22 1851 07/04/22 0315  HGB 13.7  --  13.1  --   --  12.5*  HCT 43.4  --  40.6  --   --  38.3*  PLT 245  --  222  --   --  227  APTT 32  --   --   --   --   --   LABPROT 13.2  --   --   --   --   --   INR 1.0  --   --   --   --   --   HEPARINUNFRC <0.10*   < >  --  0.73* 0.65 0.55  CREATININE 1.16  --  1.06  --   --   --    < > = values in this interval not displayed.     Estimated Creatinine Clearance: 52.5 mL/min (by C-G formula based on SCr of 1.06 mg/dL).   Medical History: Past Medical History:  Diagnosis Date   Acute cholecystitis    Acute metabolic encephalopathy    Alzheimer disease (HCC)    Anxiety    Aortic atherosclerosis (HCC)    Arthritis    Chronic kidney disease    L TOTAL NEPHRECTOMY   History of DVT (deep vein thrombosis)    Hypertension    Sepsis (HCC)     Medications:  No chronic anticoagulation currently, but patient was taking it in 2023  Assessment: 82 y.o. male with PMH Alzheimer's dementia, DVT (March 2023) who presents with RLE swelling. Ultrasound reveals extensive, acute right lower extremity deep venous thrombosis. Patient does not appear to be on Eliquis currently, however from chart review it appears he took Eliquis for a time period following his DVT in 2023, so will order a baseline heparin level to assess for recent DOAC use.  Goal of Therapy:  Heparin level 0.3-0.7 units/ml Monitor platelets by  anticoagulation protocol: Yes   Baseline Labs: aPTT - 32; INR - 1.0; Hgb - 13.7; PLT - 245   Date Time HL  Rate/Comment 06/01 2242 HL > 1.10 Supratherapeutic 0602 0853 HL 0.73 Slightly supratherapeutic, decrease to 1000 u/hr 06/02   1851   HL 0.65 Therapeutic x 1 06/03 0315 HL 0.55 Therapeutic x 2   Plan:  Continue heparin infusion at 1000 units/hr Recheck HL daily w/ AM labs while therapeutic Continue to monitor H&H and platelets daily while on heparin gtt.  Otelia Sergeant, PharmD, [facility] 07/04/2022 3:50 AM

## 2022-07-04 NOTE — Discharge Summary (Signed)
Physician Discharge Summary   Patient: Robert Le MRN: [id number] DOB: 1940-08-24  Admit date:     07/02/2022  Discharge date: 07/04/22  Discharge Physician: [name]   PCP: Earnestine Mealing, MD   Recommendations at discharge:   Follow-up with PCP in 1 week.  Discharge Diagnoses: Principal Problem:   DVT (deep venous thrombosis) (HCC) Active Problems:   Alzheimer's disease, unspecified (HCC)   Essential (primary) hypertension   Depression with anxiety   Leg DVT (deep venous thromboembolism), acute, right (HCC)  Resolved Problems:   * No resolved hospital problems. *  Hospital Course: Robert Le is a 82 y.o. male with medical history significant of left leg DVT, HTN, dementia, depression with anxiety, s/p of left nephrectomy, wheelchair-bound (not auscultatory at baseline), who presents with right leg swelling  Patient is placed on heparin drip, seen by vascular surgery, scheduled for thrombectomy on Monday. After discussion with patient the PCP, it is deemed that the benefit of a thrombectomy does not outweigh the risk.  After discussion with patient's son, decision is made to treat him medically. Currently, patient appears at baseline, medically stable to be discharged back to memory unit.  Assessment and Plan: DVT (deep venous thrombosis) (HCC):  Patient initially treated with heparin drip, transition to Eliquis this morning.       Alzheimer's disease, unspecified (HCC) Resume home medicines.   Essential (primary) hypertension Continue amlodipine.   Depression with anxiety Continue home medications        Consultants: vascular Procedures performed: None  Disposition: Assisted living Diet recommendation:  Discharge Diet Orders (From admission, onward)     Start     Ordered   07/04/22 0000  Diet - low sodium heart healthy        07/04/22 1208           Cardiac diet DISCHARGE MEDICATION: Allergies as of 07/04/2022       Reactions    Donepezil Other (See Comments)   Increased confusion Still listed on pt's current med list as of 06/14/2021   Sulfites    Unable to recall reaction or specific sulfa drug        Medication List     TAKE these medications    acetaminophen 325 MG tablet Commonly known as: TYLENOL Take 650 mg by mouth every 6 (six) hours as needed.   amLODipine 5 MG tablet Commonly known as: NORVASC Take 5 mg by mouth daily.   Apixaban Starter Pack (10mg  and 5mg ) Commonly known as: ELIQUIS STARTER PACK Take as directed on package: start with two-5mg  tablets twice daily for 7 days. On day 8, switch to one-5mg  tablet twice daily.   Artificial Tears 1 % ophthalmic solution Generic drug: carboxymethylcellulose Apply 1 drop to eye 2 (two) times daily as needed.   DULoxetine 60 MG capsule Commonly known as: CYMBALTA Take 60 mg by mouth daily.   memantine 10 MG tablet Commonly known as: NAMENDA Take 10 mg by mouth 2 (two) times daily.   OXYGEN 2lpm as needed to keep O2 saturation at 90% or greater.   Senna-S 8.6-50 MG tablet Generic drug: senna-docusate Take 1 tablet by mouth 2 (two) times daily.   THERA-TABS PO Take 1 tablet by mouth daily at 6 (six) AM.        Follow-up Information     Earnestine Mealing, MD Follow up in 1 week(s).   Specialty: Family Medicine Contact information: [address] [place] [address] [phone]  Discharge Exam: Filed Weights   07/02/22 1623  Weight: 79.8 kg   General exam: Appears calm and comfortable  Respiratory system: Clear to auscultation. Respiratory effort normal. Cardiovascular system: S1 & S2 heard, RRR. No JVD, murmurs, rubs, gallops or clicks. No pedal edema. Gastrointestinal system: Abdomen is nondistended, soft and nontender. No organomegaly or masses felt. Normal bowel sounds heard. Central nervous system: Alert and oriented x1. No focal neurological deficits. Extremities: Symmetric 5 x 5 power. Skin: No  rashes, lesions or ulcers Psychiatry: Mood & affect appropriate.    Condition at discharge: fair  The results of significant diagnostics from this hospitalization (including imaging, microbiology, ancillary and laboratory) are listed below for reference.   Imaging Studies: US Venous Img Lower Unilateral Right  Result Date: 07/02/2022 CLINICAL DATA:  Right lower extremity swelling. EXAM: RIGHT LOWER EXTREMITY VENOUS DOPPLER ULTRASOUND TECHNIQUE: Gray-scale sonography with compression, as well as color and duplex ultrasound, were performed to evaluate the deep venous system(s) from the level of the common femoral vein through the popliteal and proximal calf veins. COMPARISON:  None Available. FINDINGS: VENOUS Extensive occlusive deep venous thrombosis extends from the common femoral vein, involves the saphenofemoral junction and extends into the deep femoral vein. Occlusive thrombus extends throughout the femoral vein and popliteal vein extending into the posterior tibial veins. Peroneal vein not visualized. Limited views of the contralateral common femoral vein are unremarkable. OTHER None. Limitations: none IMPRESSION: 1. Extensive, acute right lower extremity deep venous thrombosis as detailed. Electronically Signed   By: Amie Portland M.D.   On: 07/02/2022 12:42    Microbiology: Results for orders placed or performed during the hospital encounter of 12/18/21  Resp Panel by RT-PCR (Flu A&B, Covid) Anterior Nasal Swab     Status: None   Collection Time: 12/18/21  7:07 PM   Specimen: Anterior Nasal Swab  Result Value Ref Range Status   SARS Coronavirus 2 by RT PCR NEGATIVE NEGATIVE Final    Comment: (NOTE) SARS-CoV-2 target nucleic acids are NOT DETECTED.  The SARS-CoV-2 RNA is generally detectable in upper respiratory specimens during the acute phase of infection. The lowest concentration of SARS-CoV-2 viral copies this assay can detect is 138 copies/mL. A negative result does not preclude  SARS-Cov-2 infection and should not be used as the sole basis for treatment or other patient management decisions. A negative result may occur with  improper specimen collection/handling, submission of specimen other than nasopharyngeal swab, presence of viral mutation(s) within the areas targeted by this assay, and inadequate number of viral copies(<138 copies/mL). A negative result must be combined with clinical observations, patient history, and epidemiological information. The expected result is Negative.  Fact Sheet for Patients:  BloggerCourse.com  Fact Sheet for Healthcare Providers:  SeriousBroker.it  This test is no t yet approved or cleared by the [place] FDA and  has been authorized for detection and/or diagnosis of SARS-CoV-2 by FDA under an Emergency Use Authorization (EUA). This EUA will remain  in effect (meaning this test can be used) for the duration of the COVID-19 declaration under Section 564(b)(1) of the Act, 21 U.S.C.section 360bbb-3(b)(1), unless the authorization is terminated  or revoked sooner.       Influenza A by PCR NEGATIVE NEGATIVE Final   Influenza B by PCR NEGATIVE NEGATIVE Final    Comment: (NOTE) The Xpert Xpress SARS-CoV-2/FLU/RSV plus assay is intended as an aid in the diagnosis of influenza from Nasopharyngeal swab specimens and should not be used as a sole basis for  treatment. Nasal washings and aspirates are unacceptable for Xpert Xpress SARS-CoV-2/FLU/RSV testing.  Fact Sheet for Patients: BloggerCourse.com  Fact Sheet for Healthcare Providers: SeriousBroker.it  This test is not yet approved or cleared by the [place] FDA and has been authorized for detection and/or diagnosis of SARS-CoV-2 by FDA under an Emergency Use Authorization (EUA). This EUA will remain in effect (meaning this test can be used) for the duration of  the COVID-19 declaration under Section 564(b)(1) of the Act, 21 U.S.C. section 360bbb-3(b)(1), unless the authorization is terminated or revoked.  Performed at [facility], [address] [place], [place] [address]   Blood Culture (routine x 2)     Status: None   Collection Time: 12/18/21  7:07 PM   Specimen: BLOOD  Result Value Ref Range Status   Specimen Description BLOOD BLOOD RIGHT ARM  Final   Special Requests   Final    BOTTLES DRAWN AEROBIC AND ANAEROBIC Blood Culture adequate volume   Culture   Final    NO GROWTH 5 DAYS Performed at [facility], [address], [place], [place] [address]    Report Status 12/23/2021 FINAL  Final  Blood Culture (routine x 2)     Status: None   Collection Time: 12/18/21  7:07 PM   Specimen: BLOOD  Result Value Ref Range Status   Specimen Description BLOOD BLOOD RIGHT HAND  Final   Special Requests   Final    BOTTLES DRAWN AEROBIC AND ANAEROBIC Blood Culture adequate volume   Culture   Final    NO GROWTH 5 DAYS Performed at [facility], [address] [place], [place] [address]    Report Status 12/23/2021 FINAL  Final  Urine Culture     Status: None   Collection Time: 12/18/21  7:07 PM   Specimen: In/Out Cath Urine  Result Value Ref Range Status   Specimen Description   Final    IN/OUT CATH URINE Performed at [facility], [address], [place], [place] [address]    Special Requests   Final    NONE Performed at [facility], [address], [place], [place] [address]    Culture   Final    NO GROWTH Performed at [facility], [address] [place], [place] [address]    Report Status 12/19/2021 FINAL  Final  MRSA Next Gen by PCR, Nasal     Status: None   Collection Time: 12/19/21  2:32 AM   Specimen: Nasal Mucosa; Nasal Swab  Result Value Ref Range Status   MRSA by PCR Next Gen NOT DETECTED NOT DETECTED Final    Comment: (NOTE) The GeneXpert MRSA Assay (FDA  approved for NASAL specimens only), is one component of a comprehensive MRSA colonization surveillance program. It is not intended to diagnose MRSA infection nor to guide or monitor treatment for MRSA infections. Test performance is not FDA approved in patients less than 99 years old. Performed at [facility], [address] [place], [place] [address]   C Difficile Quick Screen w PCR reflex     Status: Abnormal   Collection Time: 12/20/21  7:49 AM   Specimen: STOOL  Result Value Ref Range Status   C Diff antigen POSITIVE (A) NEGATIVE Final   C Diff toxin NEGATIVE NEGATIVE Final   C Diff interpretation Results are indeterminate. See PCR results.  Final    Comment: Performed at [facility], [address], [place], [place] [address]  C. Diff by PCR, Reflexed     Status: Abnormal   Collection Time: 12/20/21  7:49 AM  Result Value Ref Range Status   Toxigenic C. Difficile by PCR POSITIVE (A) NEGATIVE Final    Comment: Positive for toxigenic C. difficile with little to no toxin production. Only treat if clinical presentation suggests symptomatic illness. Performed at [facility], [address] [place], [place] [address]     Labs: CBC: Recent Labs  Lab 07/02/22 1106 07/03/22 0434 07/04/22 0315  WBC 8.8 9.0 10.2  HGB 13.7 13.1 12.5*  HCT 43.4 40.6 38.3*  MCV 89.9 88.3 86.3  PLT 245 222 227   Basic Metabolic Panel: Recent Labs  Lab 07/02/22 1106 07/03/22 0434  NA 141 139  K 3.9 3.8  CL 106 106  CO2 29 25  GLUCOSE 103* 100*  BUN 19 18  CREATININE 1.16 1.06  CALCIUM 9.5 9.1   Liver Function Tests: No results for input(s): "AST", "ALT", "ALKPHOS", "BILITOT", "PROT", "ALBUMIN" in the last 168 hours. CBG: No results for input(s): "GLUCAP" in the last 168 hours.  Discharge time spent: greater than 30 minutes.  Signed: [name], MD [facility] 07/04/2022

## 2022-07-04 NOTE — TOC Progression Note (Addendum)
Transition of Care [facility]) - Progression Note    Patient Details  Name: Robert Le MRN: [id number] Date of Birth: [birth year]  [facility] CM/SW Contact  Chapman Fitch, RN Phone Number: 07/04/2022, 11:18 AM  Clinical Narrative:     Call placed to [facility] [phone] to discuss patient returning. No answer, no voicemail.  TOC to call again  1157 am - call placed again to TL memory care, no answer  Expected Discharge Plan: Memory Care Barriers to Discharge: Continued Medical Work up  Expected Discharge Plan and Services                                               Social Determinants of Health (SDOH) Interventions SDOH Screenings   Food Insecurity: No Food Insecurity (07/02/2022)  Housing: Low Risk  (07/02/2022)  Transportation Needs: No Transportation Needs (07/02/2022)  Utilities: Not At Risk (07/02/2022)  Tobacco Use: Low Risk  (07/02/2022)    Readmission Risk Interventions     No data to display

## 2022-07-04 NOTE — TOC Benefit Eligibility Note (Signed)
Patient Advocate Encounter  Insurance verification completed.    The patient is currently admitted and upon discharge could be taking Eliquis 5 mg.  The current 30 day co-pay is $47.00.   The patient is insured through [employer] Medicare Part D   This test claim was processed through [facility]- copay amounts may vary at other pharmacies due to pharmacy/plan contracts, or as the patient moves through the different stages of their insurance plan.  Katherene Dinino, CPHT Pharmacy Patient Advocate Specialist [facility] Patient Advocate Team Direct Number: [phone]  Fax: [phone]       

## 2022-07-05 ENCOUNTER — Telehealth: Payer: Self-pay | Admitting: *Deleted

## 2022-07-05 NOTE — Transitions of Care (Post Inpatient/ED Visit) (Signed)
   07/05/2022  Name: Robert Le MRN: [id number] DOB: 07-08-1940  Today's TOC FU Call Status: Today's TOC FU Call Status:: Unsuccessul Call (1st Attempt) Unsuccessful Call (1st Attempt) Date: 07/05/22  Attempted to reach the patient regarding the most recent Inpatient/ED visit.  Follow Up Plan: No further outreach attempts will be made at this time. We have been unable to contact the patient.  Patient has an appointment with Dr. Sydnee Cabal scheduled for 07/06/2022.  Signature Synetta Fail Raimundo Corbit, CMA

## 2022-07-06 ENCOUNTER — Encounter: Payer: Medicare Other | Admitting: Student

## 2022-07-06 ENCOUNTER — Encounter: Payer: Self-pay | Admitting: Student

## 2022-07-06 ENCOUNTER — Non-Acute Institutional Stay: Payer: Medicare Other | Admitting: Student

## 2022-07-06 DIAGNOSIS — K5900 Constipation, unspecified: Secondary | ICD-10-CM

## 2022-07-06 DIAGNOSIS — F0280 Dementia in other diseases classified elsewhere without behavioral disturbance: Secondary | ICD-10-CM

## 2022-07-06 DIAGNOSIS — I10 Essential (primary) hypertension: Secondary | ICD-10-CM

## 2022-07-06 DIAGNOSIS — M6281 Muscle weakness (generalized): Secondary | ICD-10-CM

## 2022-07-06 DIAGNOSIS — G309 Alzheimer's disease, unspecified: Secondary | ICD-10-CM

## 2022-07-06 DIAGNOSIS — F339 Major depressive disorder, recurrent, unspecified: Secondary | ICD-10-CM

## 2022-07-06 DIAGNOSIS — I824Y1 Acute embolism and thrombosis of unspecified deep veins of right proximal lower extremity: Secondary | ICD-10-CM | POA: Diagnosis not present

## 2022-07-06 NOTE — Progress Notes (Unsigned)
Provider:  Dr. Earnestine Mealing Location:  Other [place].  Nursing Home Room Number: [facility] 106P Place of Service:  ALF [phone])  PCP: Earnestine Mealing, MD Patient Care Team: Earnestine Mealing, MD as PCP - General [facility] Medicine)  Extended Emergency Contact Information Primary Emergency Contact: [name] of [place] Mobile Phone: [phone] Relation: Son Secondary Emergency Contact: [name] of [place] Mobile Phone: [phone] Relation: Son  Code Status: DNR Goals of Care: Advanced Directive information    07/06/2022    8:53 AM  Advanced Directives  Does Patient Have a Medical Advance Directive? Yes  Type of Estate agent of [place];Out of facility DNR (pink MOST or yellow form)  Does patient want to make changes to medical advance directive? No - Patient declined  Copy of Healthcare Power of Attorney in Chart? Yes - validated most recent copy scanned in chart (See row information)      Chief Complaint  Patient presents with   Admission    Admission.     HPI: Patient is a 82 y.o. male seen today for Re-Admission.   Patient smiles and says, I'm fine, how are you?   Nursing states patient is acting as his normal self.  Patient was admitted to [facility] for large DVT of RLE. Discussion regarding surgery on Monday, however, patient remained stable on anticoagulation and after discussion with family decision was made to proceed with medical treatment rather than surgical intervention for the overall wellness of the patient.   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 He has never used smokeless tobacco. He reports that he does not drink alcohol and does not use drugs. Social History   Socioeconomic History   Marital status: Divorced    Spouse name: Not on file   Number of children: 3   Years of education: Not on file   Highest education level: Not on file  Occupational History   Occupation: Horticulturist, commercial    Comment: Retired  Tobacco Use   Smoking status: Never   Smokeless tobacco: Never  Building services engineer Use: Never used  Substance and Sexual Activity   Alcohol use: No   Drug use: No   Sexual activity: Not Currently  Other Topics Concern   Not on file  Social History Narrative   Not sure about advanced directives   Would want sons to make decisions   Would accept resuscitation attempts   Not sure about tube feeds   Social Determinants of Health   Financial Resource Strain: Not on file  Food  Insecurity: No Food Insecurity (07/02/2022)   Hunger Vital Sign    Worried About Running Out of Food in the Last Year: Never true    Ran Out of Food in the Last Year: Never true  Transportation Needs: No Transportation Needs (07/02/2022)   PRAPARE - Administrator, Civil Service (Medical): No    Lack of Transportation (Non-Medical): No  Physical Activity: Not on file  Stress: Not on file  Social Connections: Not on file  Intimate Partner Violence: Not At Risk (07/02/2022)   Humiliation, Afraid, Rape, and Kick questionnaire    Fear of Current or Ex-Partner: No    Emotionally Abused: No    Physically Abused:  No    Sexually Abused: No    Functional Status Survey:    Family History  Problem Relation Age of Onset   Heart disease Father    Arthritis Brother    Arthritis Brother    Cancer Neg Hx    Diabetes Neg Hx     Health Maintenance  Topic Date Due   DTaP/Tdap/Td (2 - Td or Tdap) 06/07/2021   COVID-19 Vaccine (8 - 2023-24 season) 07/05/2022   Zoster Vaccines- Shingrix (2 of 2) 08/04/2022   INFLUENZA VACCINE  09/01/2022   Medicare Annual Wellness (AWV)  06/09/2023   Pneumonia Vaccine 78+ Years old  Completed   HPV VACCINES  Aged Out    Allergies  Allergen Reactions   Donepezil Other (See Comments)    Increased confusion Still listed on pt's current med list as of 06/14/2021   Sulfites     Unable to recall reaction or specific sulfa drug    Outpatient Encounter Medications as of 07/06/2022  Medication Sig   acetaminophen (TYLENOL) 325 MG tablet Take 650 mg by mouth every 6 (six) hours as needed.   amLODipine (NORVASC) 5 MG tablet Take 5 mg by mouth daily.   APIXABAN (ELIQUIS) VTE STARTER PACK (10MG  AND 5MG ) Take as directed on package: start with two-5mg  tablets twice daily for 7 days. On day 8, switch to one-5mg  tablet twice daily.   carboxymethylcellulose (ARTIFICIAL TEARS) 1 % ophthalmic solution Apply 1 drop to eye 2 (two) times daily as needed.   DULoxetine (CYMBALTA) 60 MG capsule Take 60 mg by mouth daily.   memantine (NAMENDA) 10 MG tablet Take 10 mg by mouth 2 (two) times daily.   Multiple Vitamin (THERA-TABS PO) Take 1 tablet by mouth daily at 6 (six) AM.   OXYGEN 2lpm as needed to keep O2 saturation at 90% or greater.   senna-docusate (SENNA-S) 8.6-50 MG tablet Take 1 tablet by mouth 2 (two) times daily.   No facility-administered encounter medications on file as of 07/06/2022.    Review of Systems  Vitals:   07/06/22 0841 07/06/22 0929  BP: (!) 144/89 133/83  Pulse: 83   Resp: 18   Temp: 97.9 F (36.6 C)   SpO2: 93%   Weight: 183 lb 4.8 oz (83.1 kg)    Height: 5\' 7"  (1.702 m)    Body mass index is 28.71 kg/m. Physical Exam Constitutional:      Comments: Patient smells strongly of urine - he just woke for the morning and is still in bed.   Cardiovascular:     Rate and Rhythm: Normal rate.  Pulmonary:     Effort: Pulmonary effort is normal.  Abdominal:     General: Abdomen is flat.     Palpations: Abdomen is soft.  Musculoskeletal:     Comments:  Trace swelling bilateral lower extremities.   Neurological:     General: No focal deficit present.     Mental Status: He is alert. Mental status is at baseline. He is disoriented.     Labs reviewed: Basic Metabolic Panel: Recent Labs    12/22/21 0318 12/30/21 0000 07/02/22 1106 07/03/22 0434  NA 139 137 141 139  K 3.2* 4.6 3.9 3.8  CL 106 103 106 106  CO2 29 31* 29 25  GLUCOSE 97  --  103* 100*  BUN 14 19 19 18   CREATININE 0.97 1.1 1.16 1.06  CALCIUM 8.9 8.7 9.5 9.1   Liver Function Tests: Recent Labs    12/18/21 1837 12/30/21 0000  AST 25 23  ALT 17 23  ALKPHOS 76 72  BILITOT 0.8  --   PROT 6.2*  --   ALBUMIN 3.1* 3.3*   No results for input(s): "LIPASE", "AMYLASE" in the last 8760 hours. No results for input(s): "AMMONIA" in the last 8760 hours. CBC: Recent Labs    12/20/21 1047 12/22/21 0318 12/30/21 0000 07/02/22 1106 07/03/22 0434 07/04/22 0315  WBC 7.8 9.3 6.9 8.8 9.0 10.2  NEUTROABS 5.3 5.4 3,540.00  --   --   --   HGB 12.1* 12.8* 12.4* 13.7 13.1 12.5*  HCT 35.8* 37.9* 37* 43.4 40.6 38.3*  MCV 84.8 85.2  --  89.9 88.3 86.3  PLT 233 278 342 245 222 227   Cardiac Enzymes: No results for input(s): "CKTOTAL", "CKMB", "CKMBINDEX", "TROPONINI" in the last 8760 hours. BNP: Invalid input(s): "POCBNP" No results found for: "HGBA1C" No results found for: "TSH" Lab Results  Component Value Date   VITAMINB12 764 12/10/2015   No results found for: "FOLATE" No results found for: "IRON", "TIBC", "FERRITIN"  Imaging and Procedures obtained prior to  SNF admission: US Venous Img Lower Unilateral Right  Result Date: 07/02/2022 CLINICAL DATA:  Right lower extremity swelling. EXAM: RIGHT LOWER EXTREMITY VENOUS DOPPLER ULTRASOUND TECHNIQUE: Gray-scale sonography with compression, as well as color and duplex ultrasound, were performed to evaluate the deep venous system(s) from the level of the common femoral vein through the popliteal and proximal calf veins. COMPARISON:  None Available. FINDINGS: VENOUS Extensive occlusive deep venous thrombosis extends from the common femoral vein, involves the saphenofemoral junction and extends into the deep femoral vein. Occlusive thrombus extends throughout the femoral vein and popliteal vein extending into the posterior tibial veins. Peroneal vein not visualized. Limited views of the contralateral common femoral vein are unremarkable. OTHER None. Limitations: none IMPRESSION: 1. Extensive, acute right lower extremity deep venous thrombosis as detailed. Electronically Signed   By: Amie Portland M.D.   On: 07/02/2022 12:42    Assessment/Plan 1. Acute deep vein thrombosis (DVT) of proximal vein of right lower extremity (HCC) Found on exam initially with swelling and skin changes, now symmetric. On Eliquis starter pack and will continue Eliquis 5 BID. Risk of bleeding, will continue to monitor for signs or symptoms. No signs of PE at this time either. CTM>   2. Alzheimer's disease, unspecified (HCC) Patient has progressed from IL to AL and is now in memory care. He is less communicative and increasingly dependent. FAST stage 7a. He requires total care. Continue namenda at this time. Continue goals of care conversations with family-- they do not desire hospice at this time despite recommendations and education. Patient does have a DNR in place.   3. Essential (primary) hypertension BP well-controlled with amlodipine 5 mg daily.   4. Muscle weakness (  generalized) Patient is currently wheelchair bound. Given level of  dementia, unable to participate in physical therapy. Continue supportive care.   5. Recurrent major depressive disorder, remission status unspecified (HCC) Unable to fully assess mood at this time as patient is minimally communicative. Continue Cymbalta, however, plan for dose reduction as tolerated.   6. Constipation, unspecified constipation type Stable on Senna BID.     Family/ staff Communication: nursing  Labs/tests ordered: none

## 2022-07-15 ENCOUNTER — Encounter: Payer: Self-pay | Admitting: Student

## 2022-07-15 ENCOUNTER — Non-Acute Institutional Stay: Payer: Medicare Other | Admitting: Student

## 2022-07-15 DIAGNOSIS — F03C0 Unspecified dementia, severe, without behavioral disturbance, psychotic disturbance, mood disturbance, and anxiety: Secondary | ICD-10-CM | POA: Diagnosis not present

## 2022-07-15 NOTE — Progress Notes (Signed)
Location:  Other Nursing Home Room Number: [facility] 106P Place of Service:  ALF [phone]) Provider:  Ander Gaster, Benetta Spar, MD  Patient Care Team: Earnestine Mealing, MD as PCP - General (Family Medicine)  Extended Emergency Contact Information Primary Emergency Contact: [name] of [place] Mobile Phone: [phone] Relation: Son Secondary Emergency Contact: [name] of [place] Mobile Phone: [phone] Relation: Son  Code Status:  DNR Goals of care: Advanced Directive information    07/06/2022    8:53 AM  Advanced Directives  Does Patient Have a Medical Advance Directive? Yes  Type of Estate agent of [place];Out of facility DNR (pink MOST or yellow form)  Does patient want to make changes to medical advance directive? No - Patient declined  Copy of Healthcare Power of Attorney in Chart? Yes - validated most recent copy scanned in chart (See row information)     Chief Complaint  Patient presents with  . Goals of care     HPI:  Pt is a 82 y.o. Le seen today for medical management of chronic diseases.     Past Medical History:  Diagnosis Date  . Acute cholecystitis   . Acute metabolic encephalopathy   . Alzheimer disease (HCC)   . Anxiety   . Aortic atherosclerosis (HCC)   . Arthritis   . Chronic kidney disease    L TOTAL NEPHRECTOMY  . History of DVT (deep vein thrombosis)   . Hypertension   . Sepsis [facility])    Past Surgical History:  Procedure Laterality Date  . FRACTURE SURGERY     FRACTURED HIP SURGERY 1960  . IR EXCHANGE BILIARY DRAIN  06/10/2021  . IR FLUORO PROCEDURE UNLISTED  07/13/2021  . IR PERC CHOLECYSTOSTOMY  04/17/2021  . 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 TOTAL HIP ARTHROPLASTY Left 08/20/2013   Procedure: LEFT TOTAL HIP ARTHROPLASTY ANTERIOR APPROACH;  Surgeon: Shelda Pal, MD;  Location: [facility];  Service: Orthopedics;  Laterality: Left;    Allergies   Allergen Reactions  . Donepezil Other (See Comments)    Increased confusion Still listed on pt's current med list as of 06/14/2021  . Sulfites     Unable to recall reaction or specific sulfa drug    Outpatient Encounter Medications as of 07/15/2022  Medication Sig  . acetaminophen (TYLENOL) 325 MG tablet Take 650 mg by mouth every 6 (six) hours as needed.  Marland Kitchen amLODipine (NORVASC) 5 MG tablet Take 5 mg by mouth daily.  . APIXABAN (ELIQUIS) VTE STARTER PACK (10MG  AND 5MG ) Take as directed on package: start with two-5mg  tablets twice daily for 7 days. On day 8, switch to one-5mg  tablet twice daily.  . carboxymethylcellulose (ARTIFICIAL TEARS) 1 % ophthalmic solution Apply 1 drop to eye 2 (two) times daily as needed.  . DULoxetine (CYMBALTA) 60 MG capsule Take 60 mg by mouth daily.  . memantine (NAMENDA) 10 MG tablet Take 10 mg by mouth 2 (two) times daily.  . Multiple Vitamin (THERA-TABS PO) Take 1 tablet by mouth daily at 6 (six) AM.  . OXYGEN 2lpm as needed to keep O2 saturation at 90% or greater.  . senna-docusate (SENNA-S) 8.6-50 MG tablet Take 1 tablet by mouth 2 (two) times daily.   No facility-administered encounter medications on file as of 07/15/2022.    Review of Systems  Immunization History  Administered Date(s) Administered  . Covid-19, Mrna,Vaccine(Spikevax)18yrs and older 05/10/2022  . Influenza, Seasonal, Injecte, Preservative  Fre 11/01/2014, 11/26/2015  . Influenza,inj,Quad PF,6+ Mos 11/14/2016  . Influenza-Unspecified 11/16/2020, 11/17/2021  . Moderna Covid-19 Vaccine Bivalent Booster 54yrs & up 10/22/2020  . Moderna SARS-COV2 Booster Vaccination 06/19/2020  . Moderna Sars-Covid-2 Vaccination 02/10/2019, 03/10/2019, 12/11/2019  . PFIZER(Purple Top)SARS-COV-2 Vaccination 12/10/2021  . Pneumococcal Conjugate-13 01/02/2014  . Pneumococcal Polysaccharide-23 09/02/2003  . Tdap 06/08/2011  . Unspecified SARS-COV-2 Vaccination 12/11/2019  . Zoster Recombinat (Shingrix)  06/09/2022   Pertinent  Health Maintenance Due  Topic Date Due  . INFLUENZA VACCINE  09/01/2022      12/20/2021    9:24 PM 12/21/2021    7:Robert AM 12/21/2021    8:00 PM 12/22/2021    9:44 AM 06/09/2022   12:45 PM  Fall Risk  Falls in the past year?     Exclusion - non ambulatory  (RETIRED) Patient Fall Risk Level High fall risk High fall risk High fall risk High fall risk    Functional Status Survey:    There were no vitals filed for this visit. There is no height or weight on file to calculate BMI. Physical Exam  Labs reviewed: Recent Labs    12/22/21 0318 11/Robert/23 0000 07/02/22 1106 07/03/22 0434  NA 139 137 141 139  K 3.2* 4.6 3.9 3.8  CL 106 103 106 106  CO2 29 31* 29 25  GLUCOSE 97  --  103* 100*  BUN 14 19 19 18   CREATININE 0.97 1.1 1.16 1.06  CALCIUM 8.9 8.7 9.5 9.1   Recent Labs    12/18/21 1837 11/Robert/23 0000  AST 25 23  ALT 17 23  ALKPHOS 76 72  BILITOT 0.8  --   PROT 6.2*  --   ALBUMIN 3.1* 3.3*   Recent Labs    12/20/21 1047 12/22/21 0318 11/Robert/23 0000 07/02/22 1106 07/03/22 0434 07/04/22 0315  WBC 7.8 9.3 6.9 8.8 9.0 10.2  NEUTROABS 5.3 5.4 3,540.00  --   --   --   HGB 12.1* 12.8* 12.4* 13.7 13.1 12.5*  HCT 35.8* 37.9* 37* 43.4 40.6 38.3*  MCV 84.8 85.2  --  89.9 88.3 86.3  PLT 233 278 342 245 222 227   No results found for: "TSH" No results found for: "HGBA1C" No results found for: "CHOL", "HDL", "LDLCALC", "LDLDIRECT", "TRIG", "CHOLHDL"  Significant Diagnostic Results in last Robert days:  US Venous Img Lower Unilateral Right  Result Date: 07/02/2022 CLINICAL DATA:  Right lower extremity swelling. EXAM: RIGHT LOWER EXTREMITY VENOUS DOPPLER ULTRASOUND TECHNIQUE: Gray-scale sonography with compression, as well as color and duplex ultrasound, were performed to evaluate the deep venous system(s) from the level of the common femoral vein through the popliteal and proximal calf veins. COMPARISON:  None Available. FINDINGS: VENOUS Extensive  occlusive deep venous thrombosis extends from the common femoral vein, involves the saphenofemoral junction and extends into the deep femoral vein. Occlusive thrombus extends throughout the femoral vein and popliteal vein extending into the posterior tibial veins. Peroneal vein not visualized. Limited views of the contralateral common femoral vein are unremarkable. OTHER None. Limitations: none IMPRESSION: 1. Extensive, acute right lower extremity deep venous thrombosis as detailed. Electronically Signed   By: Amie Portland M.D.   On: 07/02/2022 12:42    Assessment/Plan There are no diagnoses linked to this encounter.   Family/ staff Communication: ***  Labs/tests ordered:  ***

## 2022-07-17 ENCOUNTER — Encounter: Payer: Self-pay | Admitting: Student

## 2022-08-10 IMAGING — CT CT HEAD W/O CM
4 series · 16 of 47 positions shown, 18 images · non-contrast
Comparison: CT head 08/31/2013

CLINICAL DATA: Delirium



[Series 2: head wo · axial · 0.46mm/px · z∈[-96,+29]mm · 7 of 35 slices shown, 9 images]
[im 5/35  brain]
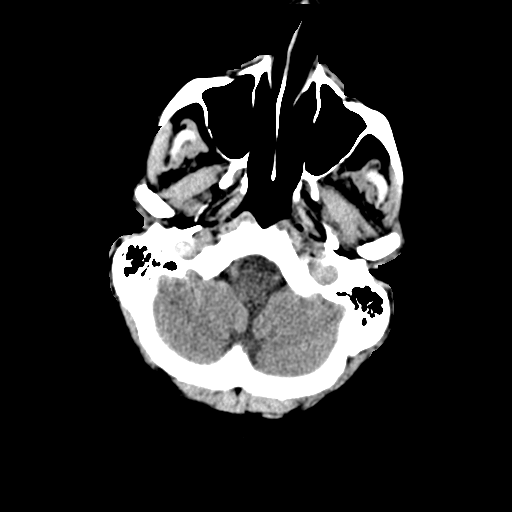
[im 5/35  bone]
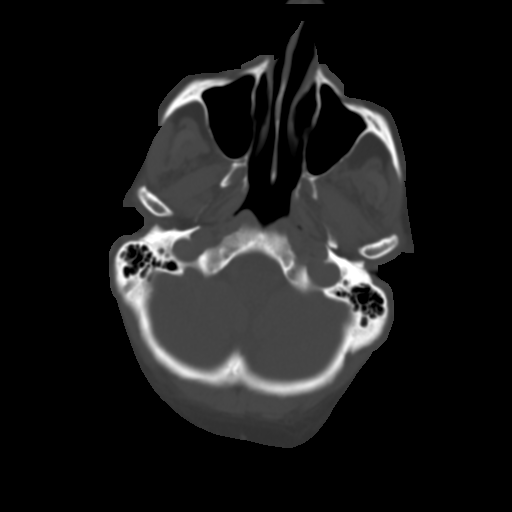
[im 9/35  brain]
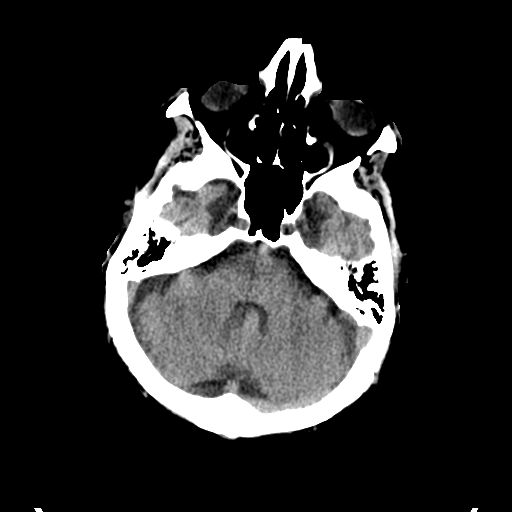
[im 13/35  brain]
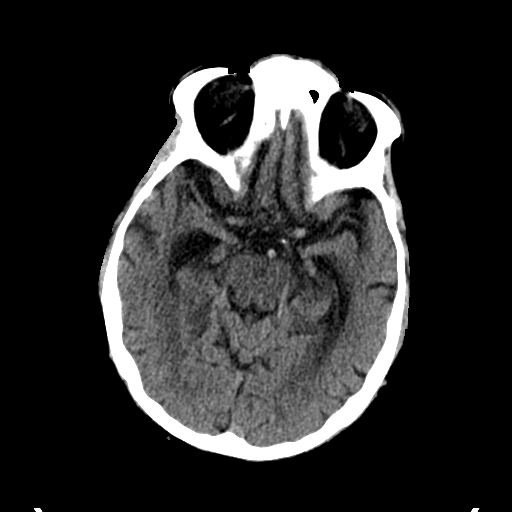
[im 18/35  brain]
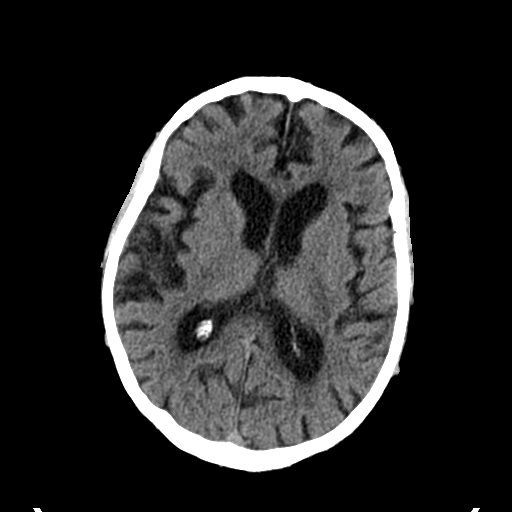
[im 22/35  brain]
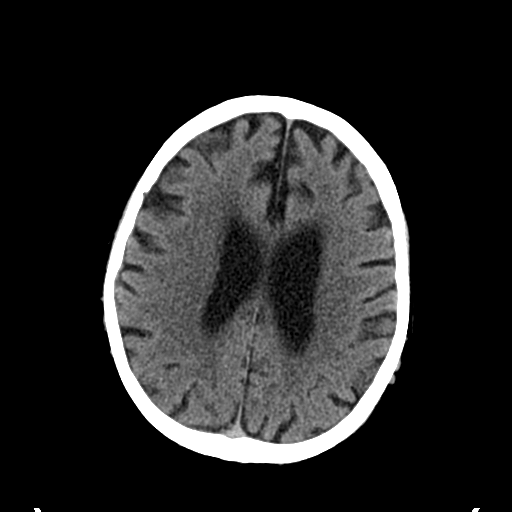
[im 22/35  bone]
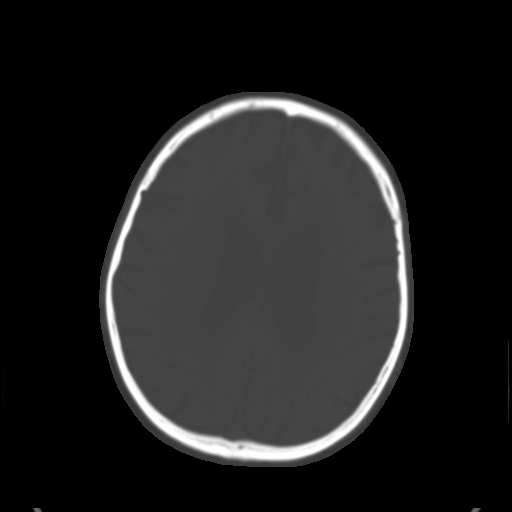
[im 26/35  brain]
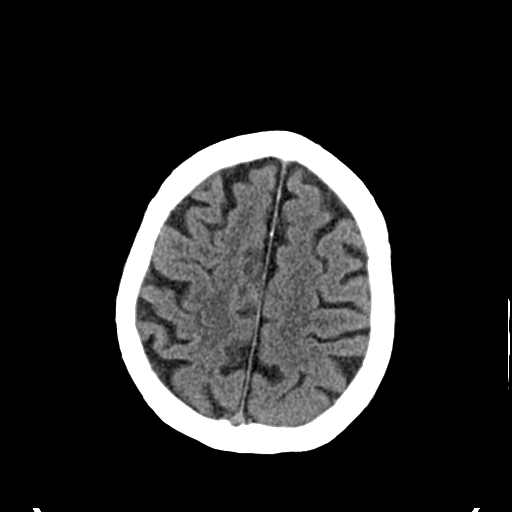
[im 30/35  brain]
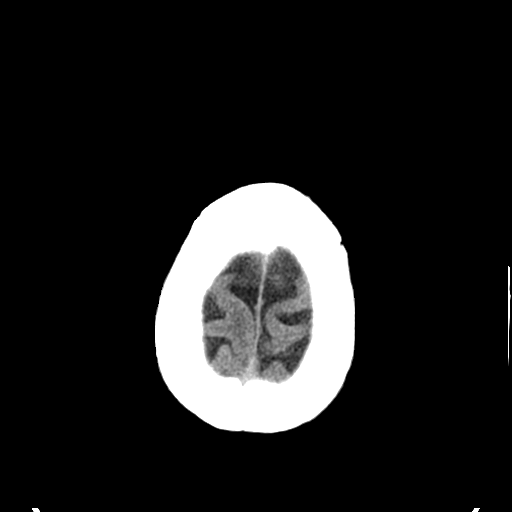

[Series 3: head bone · axial · 0.46mm/px · z∈[-100,-66]mm · 3 of 86 slices shown]
[im 9/86  bone]
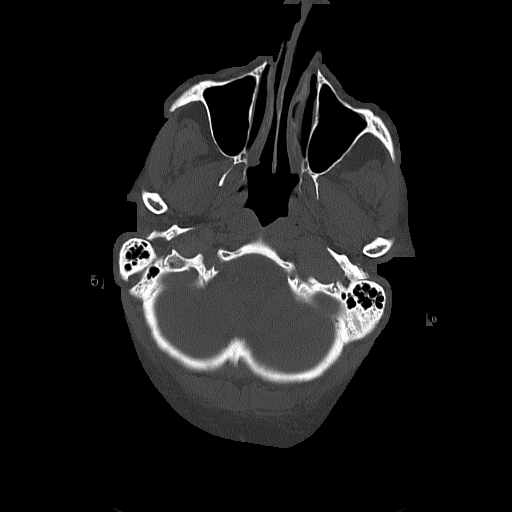
[im 18/86  bone]
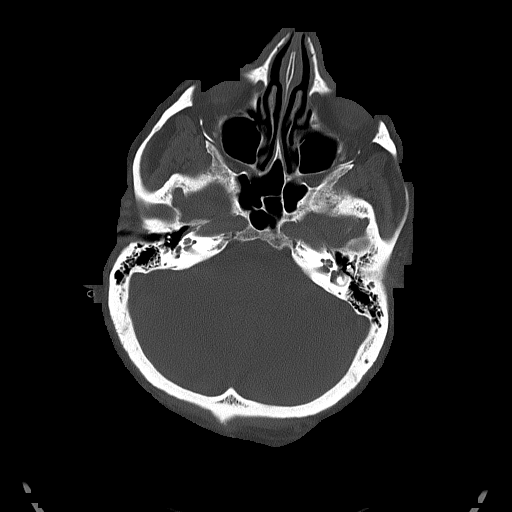
[im 26/86  bone]
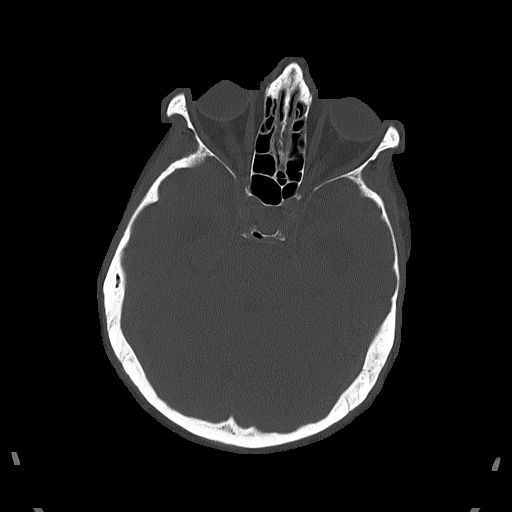

[Series 4: coronal soft tissue · coronal · 0.33mm/px · 3 of 68 slices shown]
[im 23/68  brain]
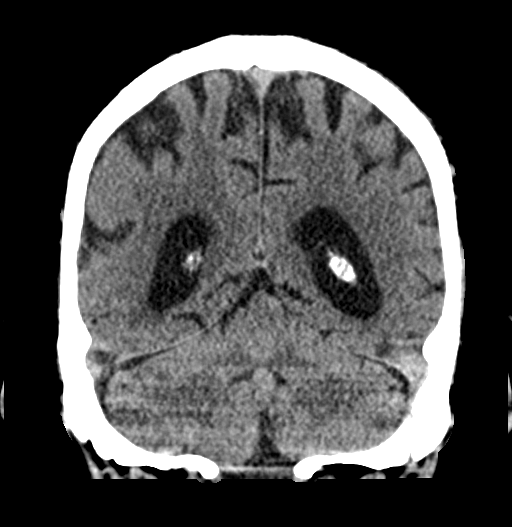
[im 30/68  brain]
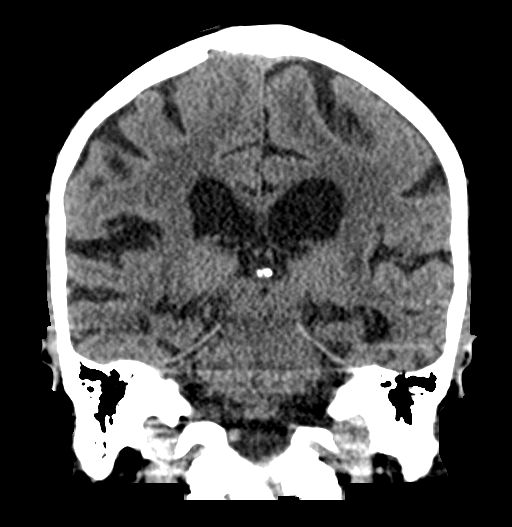
[im 38/68  brain]
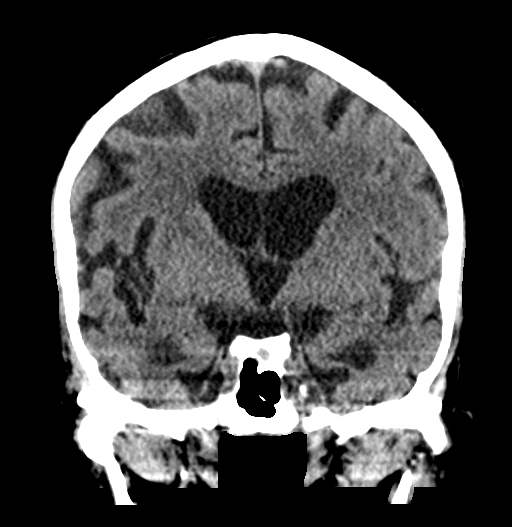

[Series 5: sagittal soft tissue · sagittal · 0.34mm/px · 3 of 54 slices shown]
[im 18/54  brain]
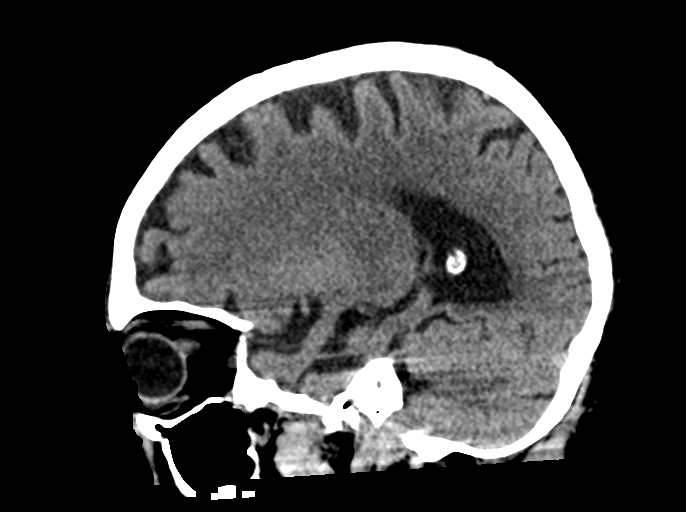
[im 27/54  brain]
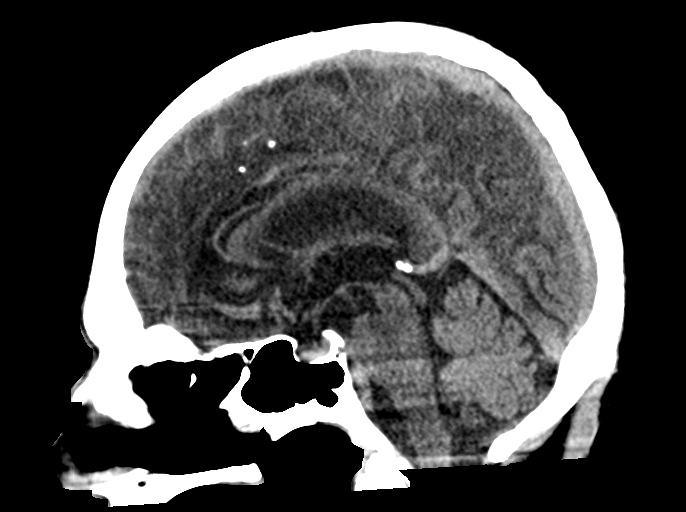
[im 36/54  brain]
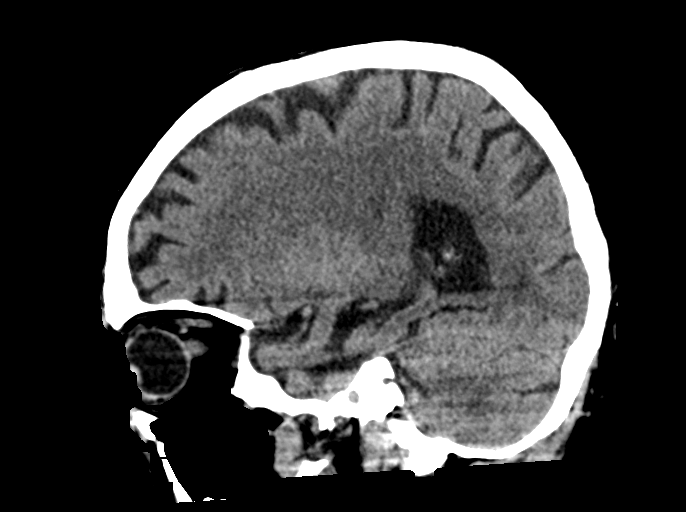

[16 of 47 positions shown; findings below may reference images not displayed]

BRAIN:
BRAIN
Cerebral ventricle sizes are concordant with the degree of cerebral
volume loss. Patchy and confluent areas of decreased attenuation are
noted throughout the deep and periventricular white matter of the
cerebral hemispheres bilaterally, compatible with chronic
microvascular ischemic disease.

No evidence of large-territorial acute infarction. No parenchymal
hemorrhage. No mass lesion. No extra-axial collection.

No mass effect or midline shift. No hydrocephalus. Basilar cisterns
are patent.

Vascular: No hyperdense vessel.

Skull: No acute fracture or focal lesion.

Sinuses/Orbits: Paranasal sinuses and mastoid air cells are clear.
The orbits are unremarkable.

Other: None.
IMPRESSION: No acute intracranial abnormality.

## 2022-08-13 IMAGING — US US EXTREM LOW VENOUS*L*
1 series · 13 of 24 positions shown · non-contrast
Comparison: None.

CLINICAL DATA: 80-year-old male with a history of cholecystitis,
leg swelling



[Series 1: lev · 13 of 37 slices shown]
[im 1/37]
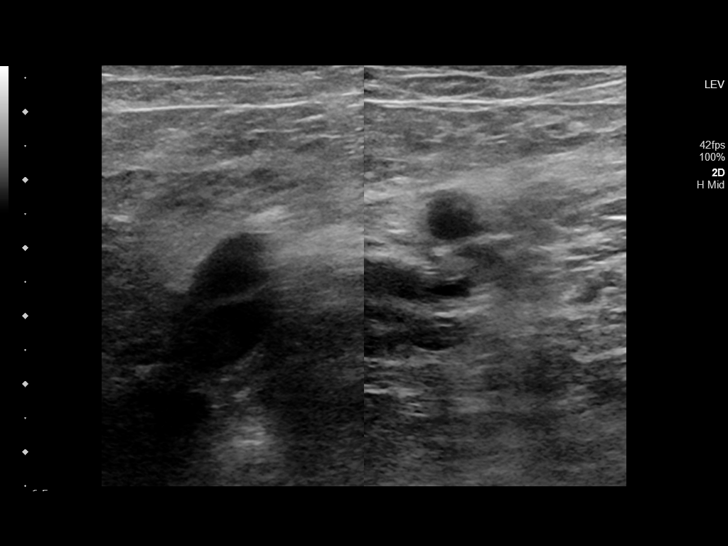
[im 4/37]
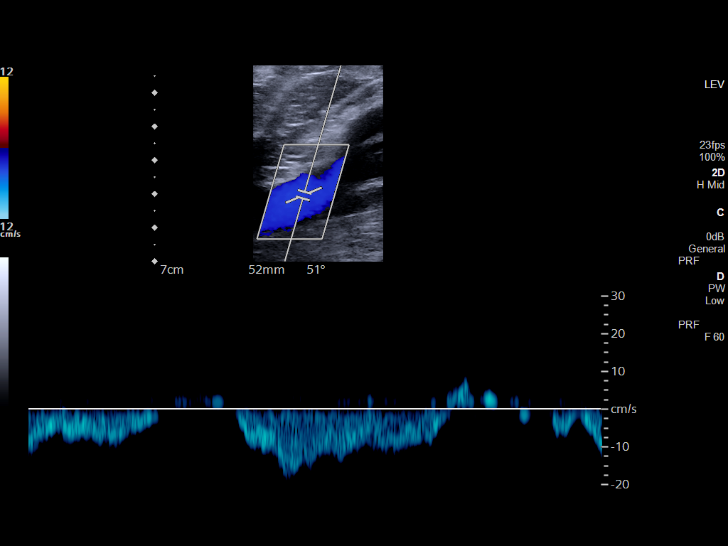
[im 7/37]
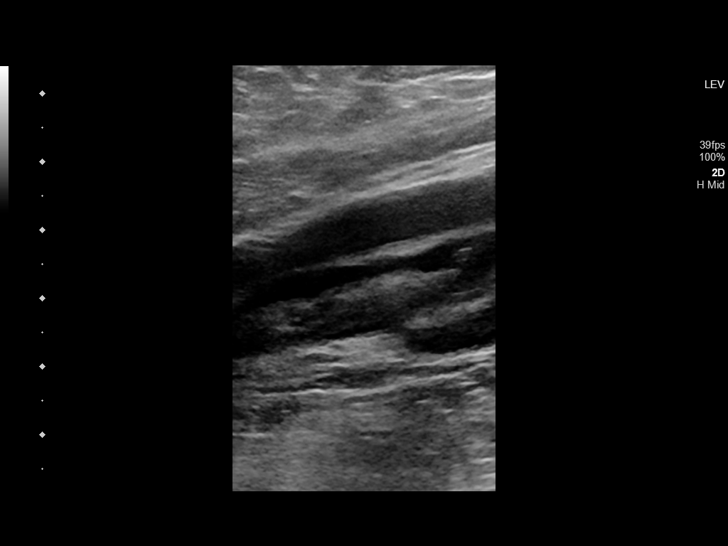
[im 10/37]
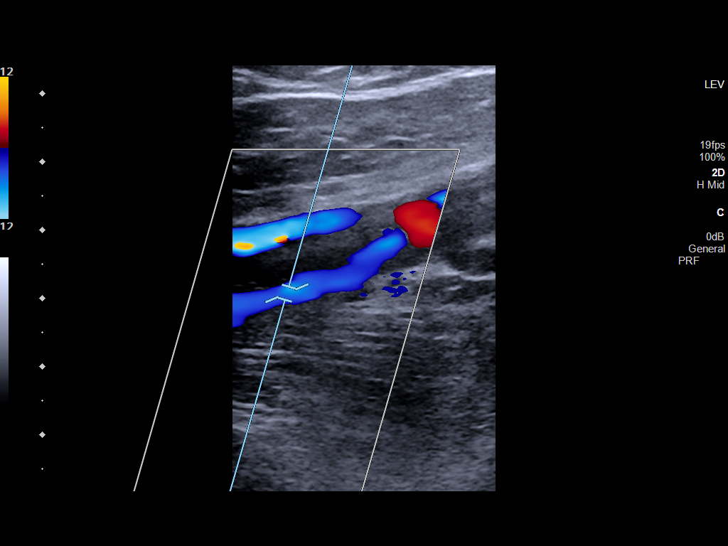
[im 13/37]
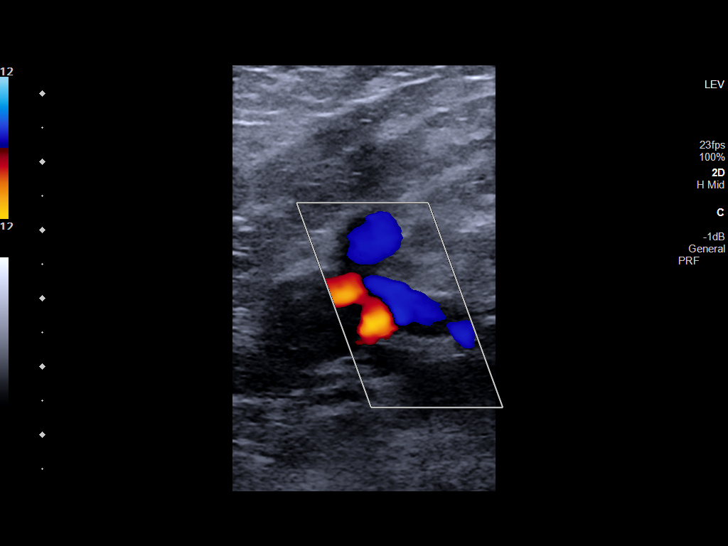
[im 16/37]
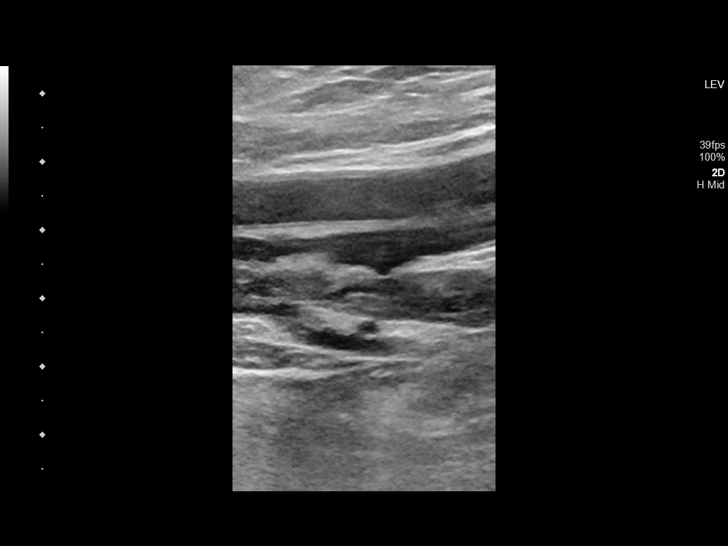
[im 19/37]
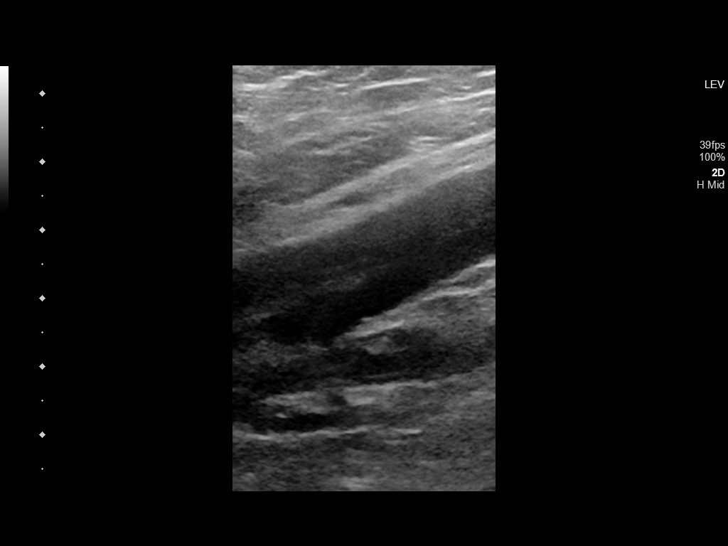
[im 21/37]
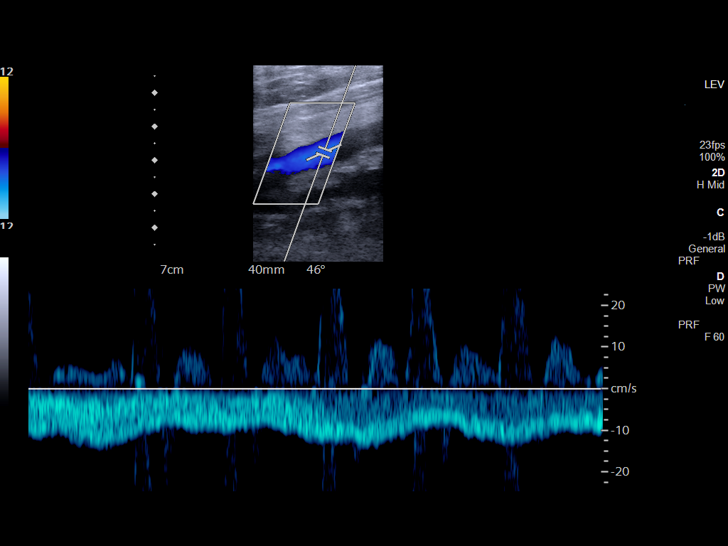
[im 24/37]
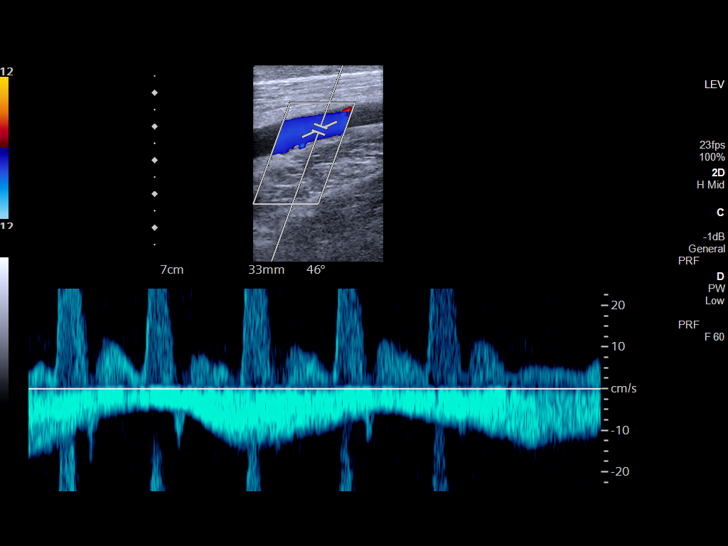
[im 27/37]
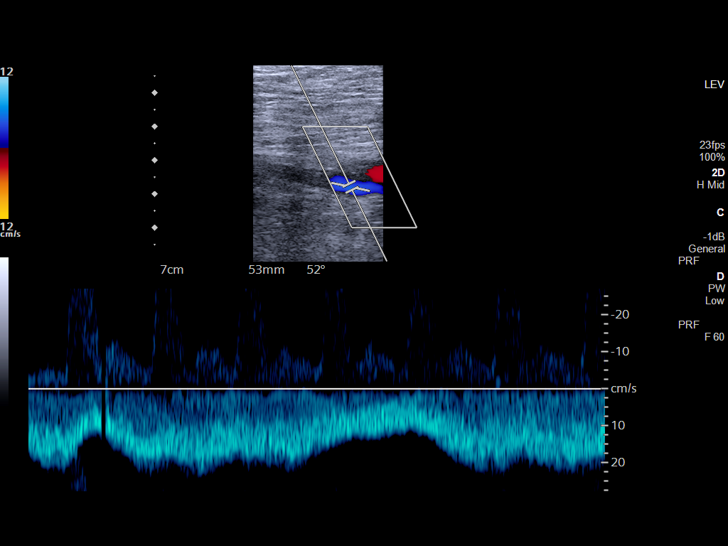
[im 30/37]
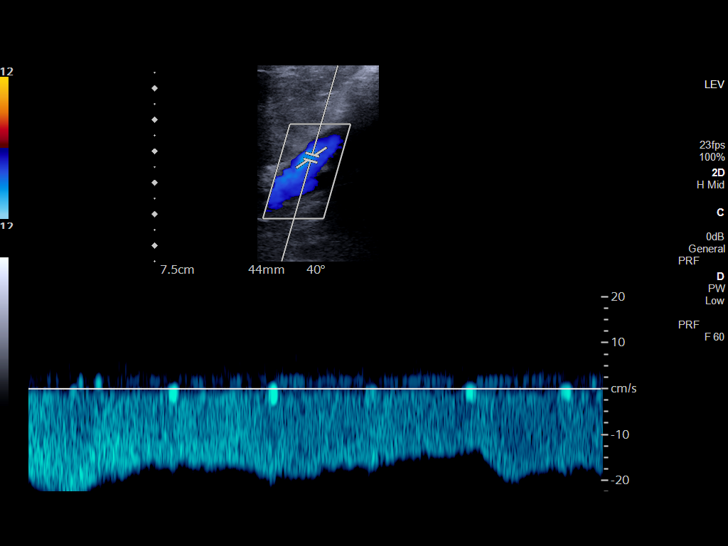
[im 33/37]
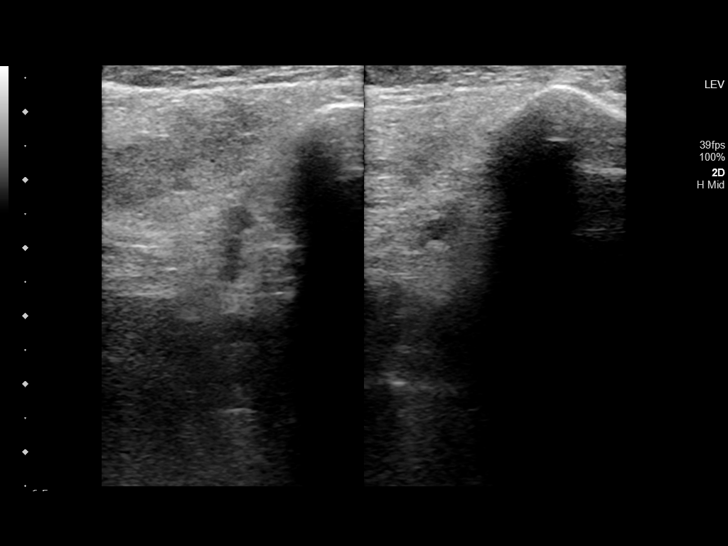
[im 37/37]
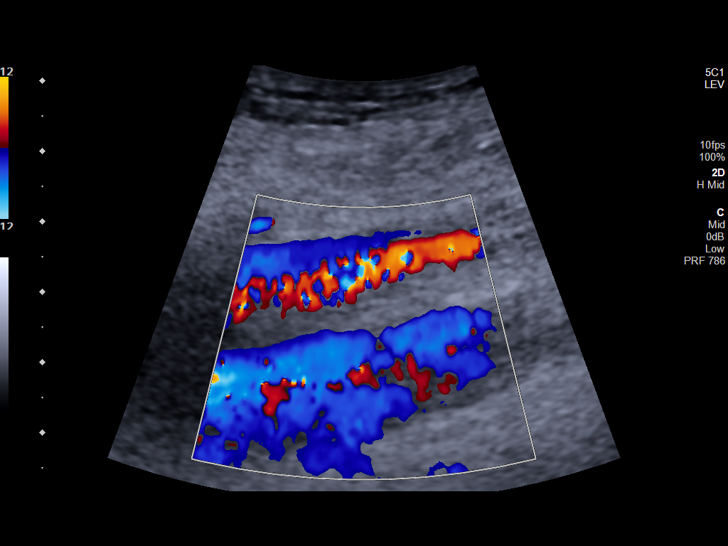

[13 of 24 positions shown; findings below may reference images not displayed]

FINDINGS: Contralateral Common Femoral Vein: Respiratory phasicity is normal
and symmetric with the symptomatic side. No evidence of thrombus.
Normal compressibility.

Common Femoral Vein: Thrombus within the common femoral vein with
incomplete compressibility. Marginal flow maintained.

Saphenofemoral Junction: Nonocclusive thrombus at the saphenofemoral
junction with incomplete compressibility.

Profunda Femoral Vein: Incompletely compressible profunda vein with
nonocclusive thrombus.

Femoral Vein: No evidence of thrombus. Normal compressibility,
respiratory phasicity and response to augmentation.

Popliteal Vein: No evidence of thrombus. Normal compressibility,
respiratory phasicity and response to augmentation.

Calf Veins: No evidence of thrombus. Normal compressibility and flow
on color Doppler imaging.

Superficial Great Saphenous Vein: No evidence of thrombus. Normal
compressibility and flow on color Doppler imaging.

Other Findings:  None.
IMPRESSION: Directed duplex left lower extremity positive for acute DVT,
nonocclusive, of the common femoral vein, saphenofemoral junction,
profunda vein.

## 2022-09-13 ENCOUNTER — Non-Acute Institutional Stay: Payer: Medicare Other | Admitting: Nurse Practitioner

## 2022-09-13 ENCOUNTER — Encounter: Payer: Self-pay | Admitting: Nurse Practitioner

## 2022-09-13 DIAGNOSIS — I10 Essential (primary) hypertension: Secondary | ICD-10-CM

## 2022-09-13 DIAGNOSIS — K5900 Constipation, unspecified: Secondary | ICD-10-CM | POA: Diagnosis not present

## 2022-09-13 DIAGNOSIS — F339 Major depressive disorder, recurrent, unspecified: Secondary | ICD-10-CM | POA: Diagnosis not present

## 2022-09-13 DIAGNOSIS — I82409 Acute embolism and thrombosis of unspecified deep veins of unspecified lower extremity: Secondary | ICD-10-CM

## 2022-09-13 DIAGNOSIS — F03C0 Unspecified dementia, severe, without behavioral disturbance, psychotic disturbance, mood disturbance, and anxiety: Secondary | ICD-10-CM

## 2022-09-13 NOTE — Progress Notes (Signed)
Location:  Other [place].  Nursing Home Room Number: [facility] 106P Place of Service:  ALF [phone]) Abbey Chatters, NP  PCP: Earnestine Mealing, MD  Patient Care Team: Earnestine Mealing, MD as PCP - General [facility] Medicine)  Extended Emergency Contact Information Primary Emergency Contact: [name] of [place] Mobile Phone: [phone] Relation: Son Secondary Emergency Contact: [name] [place] of [place] Mobile Phone: [phone] Relation: Son  Goals of care: Advanced Directive information    09/13/2022   10:58 AM  Advanced Directives  Does Patient Have a Medical Advance Directive? Yes  Type of Estate agent of [place];Out of facility DNR (pink MOST or yellow form)  Does patient want to make changes to medical advance directive? No - Patient declined  Copy of Healthcare Power of Attorney in Chart? Yes - validated most recent copy scanned in chart (See row information)     Chief Complaint  Patient presents with   Medical Management of Chronic Issues    Medical Management of Chronic Issues.     HPI:  Pt is a 82 y.o. male seen today for medical management of chronic disease.  Pt has been doing well with assistance of staff at memory care. Weight has been stable.  Eats well with staff feeding patient.  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    Past Medical History:  Diagnosis Date   Acute cholecystitis    Acute metabolic encephalopathy    Alzheimer disease (HCC)    Anxiety    Aortic atherosclerosis (HCC)    Arthritis    Chronic kidney disease    L TOTAL NEPHRECTOMY   History of DVT (deep vein thrombosis)    Hypertension    Sepsis [facility])    Past Surgical History:  Procedure Laterality Date   FRACTURE SURGERY     FRACTURED HIP  SURGERY 1960   IR EXCHANGE BILIARY DRAIN  06/10/2021   IR FLUORO PROCEDURE UNLISTED  07/13/2021   IR PERC CHOLECYSTOSTOMY  04/17/2021   NEPHRECTOMY  1985   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   TOTAL HIP ARTHROPLASTY Left 08/20/2013   Procedure: LEFT TOTAL HIP ARTHROPLASTY ANTERIOR APPROACH;  Surgeon: Shelda Pal, MD;  Location: [facility];  Service: Orthopedics;  Laterality: Left;    Allergies  Allergen Reactions   Donepezil Other (See Comments)    Increased confusion Still listed on pt's current med list as of 06/14/2021   Sulfites     Unable to recall reaction or specific sulfa drug    Outpatient Encounter Medications as of 09/13/2022  Medication Sig   acetaminophen (TYLENOL) 325 MG tablet Take 650 mg by mouth every 6 (six) hours as needed.   amLODipine (NORVASC) 5 MG tablet Take 5 mg by mouth daily.   apixaban (ELIQUIS) 5 MG TABS tablet Take 5 mg by mouth 2 (two) times daily.   carboxymethylcellulose (ARTIFICIAL TEARS) 1 % ophthalmic solution Apply 1 drop to eye 2 (two) times daily as needed.   DULoxetine (CYMBALTA) 60 MG capsule Take 60 mg by mouth daily.   memantine (NAMENDA) 10 MG tablet Take 10 mg by mouth 2 (two) times daily.   Multiple Vitamin (THERA-TABS PO) Take 1 tablet by mouth daily at 6 (six) AM.   OXYGEN 2lpm as needed to keep O2 saturation at 90%  or greater.   senna-docusate (SENNA-S) 8.6-50 MG tablet Take 1 tablet by mouth 2 (two) times daily.   No facility-administered encounter medications on file as of 09/13/2022.    Review of Systems  Unable to perform ROS: Dementia     Immunization History  Administered Date(s) Administered   Covid-19, Mrna,Vaccine(Spikevax)41yrs and older 05/10/2022   Influenza, Seasonal, Injecte, Preservative Fre 11/01/2014, 11/26/2015   Influenza,inj,Quad PF,6+ Mos 11/14/2016   Influenza-Unspecified 11/16/2020, 11/17/2021   Moderna Covid-19 Vaccine Bivalent Booster 42yrs & up 10/22/2020   Moderna SARS-COV2 Booster Vaccination 06/19/2020    Moderna Sars-Covid-2 Vaccination 02/10/2019, 03/10/2019, 12/11/2019   PFIZER(Purple Top)SARS-COV-2 Vaccination 12/10/2021   Pneumococcal Conjugate-13 01/02/2014   Pneumococcal Polysaccharide-23 09/02/2003   Tdap 06/08/2011   Unspecified SARS-COV-2 Vaccination 12/11/2019   Zoster Recombinant(Shingrix) 06/09/2022   Pertinent  Health Maintenance Due  Topic Date Due   INFLUENZA VACCINE  09/01/2022      12/20/2021    9:24 PM 12/21/2021    7:30 AM 12/21/2021    8:00 PM 12/22/2021    9:44 AM 06/09/2022   12:45 PM  Fall Risk  Falls in the past year?     Exclusion - non ambulatory  (RETIRED) Patient Fall Risk Level High fall risk High fall risk High fall risk High fall risk    Functional Status Survey:    Vitals:   09/13/22 1053  BP: 135/89  Pulse: 91  Resp: 15  Temp: 97.9 F (36.6 C)  SpO2: 97%  Weight: 178 lb 9.6 oz (81 kg)  Height: 5\' 7"  (1.702 m)   Body mass index is 27.97 kg/m. Physical Exam Constitutional:      General: He is not in acute distress.    Appearance: He is well-developed. He is not diaphoretic.  HENT:     Head: Normocephalic and atraumatic.     Right Ear: External ear normal.     Left Ear: External ear normal.     Mouth/Throat:     Pharynx: No oropharyngeal exudate.  Eyes:     Conjunctiva/sclera: Conjunctivae normal.     Pupils: Pupils are equal, round, and reactive to light.  Cardiovascular:     Rate and Rhythm: Normal rate and regular rhythm.     Heart sounds: Normal heart sounds.  Pulmonary:     Effort: Pulmonary effort is normal.     Breath sounds: Normal breath sounds.  Abdominal:     General: Bowel sounds are normal.     Palpations: Abdomen is soft.  Musculoskeletal:        General: No tenderness.     Cervical back: Normal range of motion and neck supple.     Right lower leg: No edema.     Left lower leg: No edema.  Skin:    General: Skin is warm and dry.  Neurological:     Mental Status: He is alert. Mental status is at baseline.      Motor: Weakness present.     Gait: Gait abnormal.     Labs reviewed: Recent Labs    12/22/21 0318 12/30/21 0000 07/02/22 1106 07/03/22 0434  NA 139 137 141 139  K 3.2* 4.6 3.9 3.8  CL 106 103 106 106  CO2 29 31* 29 25  GLUCOSE 97  --  103* 100*  BUN 14 19 19 18   CREATININE 0.97 1.1 1.16 1.06  CALCIUM 8.9 8.7 9.5 9.1   Recent Labs    12/18/21 1837 12/30/21 0000  AST 25 23  ALT 17 23  ALKPHOS 76 72  BILITOT 0.8  --   PROT 6.2*  --   ALBUMIN 3.1* 3.3*   Recent Labs    12/20/21 1047 12/22/21 0318 12/30/21 0000 07/02/22 1106 07/03/22 0434 07/04/22 0315  WBC 7.8 9.3 6.9 8.8 9.0 10.2  NEUTROABS 5.3 5.4 3,540.00  --   --   --   HGB 12.1* 12.8* 12.4* 13.7 13.1 12.5*  HCT 35.8* 37.9* 37* 43.4 40.6 38.3*  MCV 84.8 85.2  --  89.9 88.3 86.3  PLT 233 278 342 245 222 227   No results found for: "TSH" No results found for: "HGBA1C" No results found for: "CHOL", "HDL", "LDLCALC", "LDLDIRECT", "TRIG", "CHOLHDL"  Significant Diagnostic Results in last 30 days:  No results found.  Assessment/Plan 1. Constipation, unspecified constipation type Stable, continues on OTC.   2. Recurrent deep vein thrombosis (DVT) Noted DVT in June of 2024 to right leg, continues on eliquis bid. No signs of blood loss.   3. 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   4. Essential (primary) hypertension Blood pressure well controlled, goal bp <140/90 Continue current medications and dietary modifications follow metabolic panel  5. Recurrent major depressive disorder, remission status unspecified (HCC) -continues on cymbalta no signs of worsening depression or anxiety    K. Biagio [facility] [phone]

## 2022-10-19 ENCOUNTER — Non-Acute Institutional Stay (SKILLED_NURSING_FACILITY): Payer: Medicare Other | Admitting: Student

## 2022-10-19 ENCOUNTER — Encounter: Payer: Self-pay | Admitting: Student

## 2022-10-19 DIAGNOSIS — G301 Alzheimer's disease with late onset: Secondary | ICD-10-CM | POA: Diagnosis not present

## 2022-10-19 DIAGNOSIS — I82401 Acute embolism and thrombosis of unspecified deep veins of right lower extremity: Secondary | ICD-10-CM | POA: Diagnosis not present

## 2022-10-19 DIAGNOSIS — I10 Essential (primary) hypertension: Secondary | ICD-10-CM | POA: Diagnosis not present

## 2022-10-19 DIAGNOSIS — F02C0 Dementia in other diseases classified elsewhere, severe, without behavioral disturbance, psychotic disturbance, mood disturbance, and anxiety: Secondary | ICD-10-CM

## 2022-10-19 DIAGNOSIS — Z741 Need for assistance with personal care: Secondary | ICD-10-CM

## 2022-10-19 NOTE — Progress Notes (Signed)
Provider:  Dr. Earnestine Le Location:  Other [place].  Nursing Home Room Number: [facility] 420A Place of Service:  SNF (31)  PCP: Robert Mealing, MD Patient Care Team: Robert Mealing, MD as PCP - General [facility] Medicine)  Extended Emergency Contact Information Primary Emergency Contact: Robert Le of [place] Mobile Phone: [phone] Relation: Son Secondary Emergency Contact: Robert Le [place] of [place] Mobile Phone: [phone] Relation: Son  Code Status: DNR Goals of Care: Advanced Directive information    10/19/2022    9:18 AM  Advanced Directives  Does Patient Have a Medical Advance Directive? Yes  Type of Estate agent of [place];Out of facility DNR (pink MOST or yellow form)  Does patient want to make changes to medical advance directive? No - Patient declined  Copy of Healthcare Power of Attorney in Chart? Yes - validated most recent copy scanned in chart (See row information)      Chief Complaint  Patient presents with   New Admit To SNF    Admission.     HPI: Patient is a 82 y.o. male seen today for admission to [facility].   Patient was previously in memory care and moved to higher level of care due to continued decline. Weight is stable. Staff feeds patient meals. Minimal verbal communication. No wounds. Recent fall without injury. Of note, patient had second DVT this year unprovoked on eliquis. Staff states he seems to be doing fine.   Past Medical History:  Diagnosis Date   Acute cholecystitis    Acute metabolic encephalopathy    Alzheimer disease (HCC)    Anxiety    Aortic atherosclerosis (HCC)    Arthritis    Chronic kidney disease    L TOTAL NEPHRECTOMY   History of DVT (deep vein thrombosis)    Hypertension    Sepsis [facility])    Past Surgical History:  Procedure Laterality Date   FRACTURE SURGERY     FRACTURED HIP SURGERY 1960   IR EXCHANGE BILIARY DRAIN  06/10/2021   IR  FLUORO PROCEDURE UNLISTED  07/13/2021   IR PERC CHOLECYSTOSTOMY  04/17/2021   NEPHRECTOMY  1985   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   TOTAL HIP ARTHROPLASTY Left 08/20/2013   Procedure: LEFT TOTAL HIP ARTHROPLASTY ANTERIOR APPROACH;  Surgeon: Shelda Pal, MD;  Location: [facility];  Service: Orthopedics;  Laterality: Left;    reports that he has never smoked. He has never used smokeless tobacco. He reports that he does not drink alcohol and does not use drugs. Social History   Socioeconomic History   Marital status: Divorced    Spouse name: Not on file   Number of children: 3   Years of education: Not on file   Highest education level: Not on file  Occupational History   Occupation: Horticulturist, commercial    Comment: Retired  Tobacco Use   Smoking status: Never   Smokeless tobacco: Never  Vaping Use   Vaping status: Never Used  Substance and Sexual Activity   Alcohol use: No   Drug use: No   Sexual activity: Not Currently  Other Topics Concern   Not on file  Social History Narrative   Not sure about advanced directives   Would want sons to make decisions   Would accept resuscitation attempts   Not sure about tube feeds   Social Determinants of Health   Financial Resource Strain: Not on file  Food Insecurity: No Food Insecurity (07/02/2022)   Hunger  Vital Sign    Worried About Programme researcher, broadcasting/film/video in the Last Year: Never true    Ran Out of Food in the Last Year: Never true  Transportation Needs: No Transportation Needs (07/02/2022)   PRAPARE - Administrator, Civil Service (Medical): No    Lack of Transportation (Non-Medical): No  Physical Activity: Not on file  Stress: Not on file  Social Connections: Not on file  Intimate Partner Violence: Not At Risk (07/02/2022)   Humiliation, Afraid, Rape, and Kick questionnaire    Fear of Current or Ex-Partner: No    Emotionally Abused: No    Physically Abused: No    Sexually Abused: No    Functional Status Survey:     Family History  Problem Relation Age of Onset   Heart disease Father    Arthritis Brother    Arthritis Brother    Cancer Neg Hx    Diabetes Neg Hx     Health Maintenance  Topic Date Due   DTaP/Tdap/Td (2 - Td or Tdap) 06/07/2021   Zoster Vaccines- Shingrix (2 of 2) 08/04/2022   INFLUENZA VACCINE  09/01/2022   COVID-19 Vaccine (9 - 2023-24 season) 10/02/2022   Medicare Annual Wellness (AWV)  06/09/2023   Pneumonia Vaccine 31+ Years old  Completed   HPV VACCINES  Aged Out    Allergies  Allergen Reactions   Donepezil Other (See Comments)    Increased confusion Still listed on pt's current med list as of 06/14/2021   Sulfites     Unable to recall reaction or specific sulfa drug    Outpatient Encounter Medications as of 10/19/2022  Medication Sig   acetaminophen (TYLENOL) 500 MG tablet Take 1,000 mg by mouth every 8 (eight) hours as needed.   amLODipine (NORVASC) 5 MG tablet Take 5 mg by mouth daily.   apixaban (ELIQUIS) 5 MG TABS tablet Take 5 mg by mouth 2 (two) times daily.   carboxymethylcellulose (ARTIFICIAL TEARS) 1 % ophthalmic solution Apply 1 drop to eye 2 (two) times daily as needed.   DULoxetine (CYMBALTA) 60 MG capsule Take 60 mg by mouth daily.   memantine (NAMENDA) 10 MG tablet Take 10 mg by mouth 2 (two) times daily.   Multiple Vitamin (THERA-TABS PO) Take 1 tablet by mouth daily at 6 (six) AM.   OXYGEN 2lpm as needed to keep O2 saturation at 90% or greater.   senna-docusate (SENNA-S) 8.6-50 MG tablet Take 1 tablet by mouth 2 (two) times daily.   [DISCONTINUED] acetaminophen (TYLENOL) 325 MG tablet Take 650 mg by mouth every 6 (six) hours as needed.   No facility-administered encounter medications on file as of 10/19/2022.    Review of Systems  Vitals:   10/19/22 0910  BP: 126/64  Pulse: 94  Resp: 18  Temp: 97.6 F (36.4 C)  SpO2: 96%  Weight: 180 lb 6.4 oz (81.8 kg)  Height: 5\' 7"  (1.702 m)   Body mass index is 28.25 kg/m. Physical  Exam Constitutional:      Comments: Patient sitting in wheelchair leaning far to the left, difficult to arouse, smiles without speaking  Cardiovascular:     Rate and Rhythm: Normal rate.     Pulses: Normal pulses.  Pulmonary:     Effort: Pulmonary effort is normal.  Abdominal:     General: Abdomen is flat.  Neurological:     Mental Status: He is alert. He is disoriented.     Labs reviewed: Basic Metabolic Panel: Recent Labs  12/22/21 0318 12/30/21 0000 07/02/22 1106 07/03/22 0434  NA 139 137 141 139  K 3.2* 4.6 3.9 3.8  CL 106 103 106 106  CO2 29 31* 29 25  GLUCOSE 97  --  103* 100*  BUN 14 19 19 18   CREATININE 0.97 1.1 1.16 1.06  CALCIUM 8.9 8.7 9.5 9.1   Liver Function Tests: Recent Labs    12/18/21 1837 12/30/21 0000  AST 25 23  ALT 17 23  ALKPHOS 76 72  BILITOT 0.8  --   PROT 6.2*  --   ALBUMIN 3.1* 3.3*   No results for input(s): "LIPASE", "AMYLASE" in the last 8760 hours. No results for input(s): "AMMONIA" in the last 8760 hours. CBC: Recent Labs    12/20/21 1047 12/22/21 0318 12/30/21 0000 07/02/22 1106 07/03/22 0434 07/04/22 0315  WBC 7.8 9.3 6.9 8.8 9.0 10.2  NEUTROABS 5.3 5.4 3,540.00  --   --   --   HGB 12.1* 12.8* 12.4* 13.7 13.1 12.5*  HCT 35.8* 37.9* 37* 43.4 40.6 38.3*  MCV 84.8 85.2  --  89.9 88.3 86.3  PLT 233 278 342 245 222 227   Cardiac Enzymes: No results for input(s): "CKTOTAL", "CKMB", "CKMBINDEX", "TROPONINI" in the last 8760 hours. BNP: Invalid input(s): "POCBNP" No results found for: "HGBA1C" No results found for: "TSH" Lab Results  Component Value Date   VITAMINB12 764 12/10/2015   No results found for: "FOLATE" No results found for: "IRON", "TIBC", "FERRITIN"  Imaging and Procedures obtained prior to SNF admission: US Venous Img Lower Unilateral Right  Result Date: 07/02/2022 CLINICAL DATA:  Right lower extremity swelling. EXAM: RIGHT LOWER EXTREMITY VENOUS DOPPLER ULTRASOUND TECHNIQUE: Gray-scale sonography  with compression, as well as color and duplex ultrasound, were performed to evaluate the deep venous system(s) from the level of the common femoral vein through the popliteal and proximal calf veins. COMPARISON:  None Available. FINDINGS: VENOUS Extensive occlusive deep venous thrombosis extends from the common femoral vein, involves the saphenofemoral junction and extends into the deep femoral vein. Occlusive thrombus extends throughout the femoral vein and popliteal vein extending into the posterior tibial veins. Peroneal vein not visualized. Limited views of the contralateral common femoral vein are unremarkable. OTHER None. Limitations: none IMPRESSION: 1. Extensive, acute right lower extremity deep venous thrombosis as detailed. Electronically Signed   By: Amie Portland M.D.   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 He is completely dependent. Incontinent of bowel and bladder. Minimally communicative with a few words periodically. Eating well with total assistance no overt signs of dsyphagia. SLP evaluation on admission. Continue eliquis. BP well-controlled. Continue goals of care conversations regarding escalation of care. At this time, patient meets criteria for hospice, however, will discuss further with family regarding concern and high risk for complication.   Family/ staff Communication: nursing  Labs/tests ordered: none

## 2022-11-16 ENCOUNTER — Encounter: Payer: Self-pay | Admitting: Student

## 2022-11-16 ENCOUNTER — Non-Acute Institutional Stay (SKILLED_NURSING_FACILITY): Payer: Medicare Other | Admitting: Student

## 2022-11-16 DIAGNOSIS — I10 Essential (primary) hypertension: Secondary | ICD-10-CM | POA: Diagnosis not present

## 2022-11-16 DIAGNOSIS — I82401 Acute embolism and thrombosis of unspecified deep veins of right lower extremity: Secondary | ICD-10-CM

## 2022-11-16 DIAGNOSIS — F339 Major depressive disorder, recurrent, unspecified: Secondary | ICD-10-CM

## 2022-11-16 DIAGNOSIS — G301 Alzheimer's disease with late onset: Secondary | ICD-10-CM

## 2022-11-16 DIAGNOSIS — F02C0 Dementia in other diseases classified elsewhere, severe, without behavioral disturbance, psychotic disturbance, mood disturbance, and anxiety: Secondary | ICD-10-CM

## 2022-11-16 NOTE — Progress Notes (Unsigned)
Location:  Other [place].  Nursing Home Room Number: [facility] [address] Place of Service:  SNF [phone]) Provider:  Earnestine Mealing, MD  Patient Care Team: Earnestine Mealing, MD as PCP - General [facility] Medicine)  Extended Emergency Contact Information Primary Emergency Contact: [name] of [place] Mobile Phone: [phone] Relation: Son Secondary Emergency Contact: [name] of [place] Mobile Phone: [phone] Relation: Son  Code Status:  DNR Goals of care: Advanced Directive information    11/16/2022   10:50 AM  Advanced Directives  Does Patient Have a Medical Advance Directive? Yes  Type of Estate agent of [place];Out of facility DNR (pink MOST or yellow form)  Does patient want to make changes to medical advance directive? No - Patient declined  Copy of Healthcare Power of Attorney in Chart? Yes - validated most recent copy scanned in chart (See row information)     Chief Complaint  Patient presents with   Medical Management of Chronic Issues    Medical Management of Chronic Issues.     HPI:  Pt is a 82 y.o. male seen today for medical management of chronic diseases.   Patient with advanced dementia nonverbal.  Requires total care.  Per CNA's at bedside patient has been more been more challenging to change and get dressed.  He requires feeding assistance.  Weight remains stable continues to eat well.  Recent discussion with son Trey Paula regarding goals of care.  He states at this time he would not want to incite any pain for the patient and would defer COVID vaccination at this time however will have the flu vaccination.  Plan for an visit from second son later this month to help determine whether or not patient should be transition to hospice level of care.  Discussed concern for aspiration and aspiration pneumonia in the upcoming months.  Goals would be comfort measures per Trey Paula [facility])  Past Medical History:   Diagnosis Date   Acute cholecystitis    Acute metabolic encephalopathy    Alzheimer disease (HCC)    Anxiety    Aortic atherosclerosis (HCC)    Arthritis    Chronic kidney disease    L TOTAL NEPHRECTOMY   History of DVT (deep vein thrombosis)    Hypertension    Sepsis [facility])    Past Surgical History:  Procedure Laterality Date   FRACTURE SURGERY     FRACTURED HIP SURGERY 1960   IR EXCHANGE BILIARY DRAIN  06/10/2021   IR FLUORO PROCEDURE UNLISTED  07/13/2021   IR PERC CHOLECYSTOSTOMY  04/17/2021   NEPHRECTOMY  1985   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   TOTAL HIP ARTHROPLASTY Left 08/20/2013   Procedure: LEFT TOTAL HIP ARTHROPLASTY ANTERIOR APPROACH;  Surgeon: Shelda Pal, MD;  [facility];  Service: Orthopedics;  Laterality: Left;    Allergies  Allergen Reactions   Donepezil Other (See Comments)    Increased confusion Still listed on pt's current med list as of 06/14/2021   Sulfites     Unable to recall reaction or specific sulfa drug    Outpatient Encounter Medications as of 11/16/2022  Medication Sig   acetaminophen (TYLENOL) 500 MG tablet Take 1,000 mg by mouth every 8 (eight) hours as needed.   amLODipine (NORVASC) 5 MG tablet Take 5 mg by mouth daily.   apixaban (ELIQUIS) 5 MG TABS tablet Take 5 mg by mouth 2 (two) times daily.   DULoxetine (CYMBALTA) 60 MG capsule Take 60 mg by mouth  daily.   memantine (NAMENDA) 10 MG tablet Take 10 mg by mouth 2 (two) times daily.   Multiple Vitamin (THERA-TABS PO) Take 1 tablet by mouth daily at 6 (six) AM.   senna-docusate (SENNA-S) 8.6-50 MG tablet Take 1 tablet by mouth 2 (two) times daily.   [DISCONTINUED] carboxymethylcellulose (ARTIFICIAL TEARS) 1 % ophthalmic solution Apply 1 drop to eye 2 (two) times daily as needed.   [DISCONTINUED] OXYGEN 2lpm as needed to keep O2 saturation at 90% or greater.   No facility-administered encounter medications on file as of 11/16/2022.    Review of Systems  Immunization History   Administered Date(s) Administered   Influenza, Seasonal, Injecte, Preservative Fre 11/01/2014, 11/26/2015   Influenza,inj,Quad PF,6+ Mos 11/14/2016   Influenza-Unspecified 11/16/2020, 11/17/2021   Moderna Covid-19 Fall Seasonal Vaccine 20yrs & older 05/10/2022   Moderna Covid-19 Vaccine Bivalent Booster 49yrs & up 10/22/2020   Moderna SARS-COV2 Booster Vaccination 06/19/2020   Moderna Sars-Covid-2 Vaccination 02/10/2019, 03/10/2019, 12/11/2019   PFIZER(Purple Top)SARS-COV-2 Vaccination 12/10/2021   Pneumococcal Conjugate-13 01/02/2014   Pneumococcal Polysaccharide-23 09/02/2003   Tdap 06/08/2011   Unspecified SARS-COV-2 Vaccination 12/11/2019   Zoster Recombinant(Shingrix) 06/09/2022   Pertinent  Health Maintenance Due  Topic Date Due   INFLUENZA VACCINE  09/01/2022      12/20/2021    9:24 PM 12/21/2021    7:30 AM 12/21/2021    8:00 PM 12/22/2021    9:44 AM 06/09/2022   12:45 PM  Fall Risk  Falls in the past year?     Exclusion - non ambulatory  (RETIRED) Patient Fall Risk Level High fall risk High fall risk High fall risk High fall risk    Functional Status Survey:    Vitals:   11/16/22 1045  BP: 122/84  Pulse: 80  Resp: 19  Temp: 97.6 F (36.4 C)  SpO2: 93%  Weight: 182 lb 9.6 oz (82.8 kg)  Height: 5\' 7"  (1.702 m)   Body mass index is 28.6 kg/m. Physical Exam Cardiovascular:     Rate and Rhythm: Normal rate.     Pulses: Normal pulses.  Abdominal:     General: Abdomen is flat.     Palpations: Abdomen is soft.  Skin:    General: Skin is warm and dry.  Neurological:     Mental Status: He is alert.     Labs reviewed: Recent Labs    12/22/21 0318 12/30/21 0000 07/02/22 1106 07/03/22 0434  NA 139 137 141 139  K 3.2* 4.6 3.9 3.8  CL 106 103 106 106  CO2 29 31* 29 25  GLUCOSE 97  --  103* 100*  BUN 14 19 19 18   CREATININE 0.97 1.1 1.16 1.06  CALCIUM 8.9 8.7 9.5 9.1   Recent Labs    12/18/21 1837 12/30/21 0000  AST 25 23  ALT 17 23  ALKPHOS  76 72  BILITOT 0.8  --   PROT 6.2*  --   ALBUMIN 3.1* 3.3*   Recent Labs    12/20/21 1047 12/22/21 0318 12/30/21 0000 07/02/22 1106 07/03/22 0434 07/04/22 0315  WBC 7.8 9.3 6.9 8.8 9.0 10.2  NEUTROABS 5.3 5.4 3,540.00  --   --   --   HGB 12.1* 12.8* 12.4* 13.7 13.1 12.5*  HCT 35.8* 37.9* 37* 43.4 40.6 38.3*  MCV 84.8 85.2  --  89.9 88.3 86.3  PLT 233 278 342 245 222 227   No results found for: "TSH" No results found for: "HGBA1C" No results found for: "CHOL", "HDL", "LDLCALC", "  LDLDIRECT", "TRIG", "CHOLHDL"  Significant Diagnostic Results in last 30 days:  No results found.  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  He is nonverbal and requires total care.  Weight remains stable at this time averaging 2 pounds.  Eating well with assistance.  Fast stage VII at this time.  Recommendation would be transition to hospice level of care, however waiting for additional goals of care conversations with family members.  Will continue supportive care.  History of recent DVT as well as 1 DVT of left leg a year ago, patient will be on an definite anticoagulation.BP well-controlled. Unable to assess mood at this time, however, continue medication for stability.   Family/ staff Communication: nursing  Labs/tests ordered:  .none

## 2022-12-20 ENCOUNTER — Encounter: Payer: Self-pay | Admitting: Nurse Practitioner

## 2022-12-20 ENCOUNTER — Non-Acute Institutional Stay (SKILLED_NURSING_FACILITY): Payer: Medicare Other | Admitting: Nurse Practitioner

## 2022-12-20 DIAGNOSIS — G301 Alzheimer's disease with late onset: Secondary | ICD-10-CM | POA: Diagnosis not present

## 2022-12-20 DIAGNOSIS — I10 Essential (primary) hypertension: Secondary | ICD-10-CM

## 2022-12-20 DIAGNOSIS — K5900 Constipation, unspecified: Secondary | ICD-10-CM | POA: Diagnosis not present

## 2022-12-20 DIAGNOSIS — F339 Major depressive disorder, recurrent, unspecified: Secondary | ICD-10-CM

## 2022-12-20 DIAGNOSIS — I82409 Acute embolism and thrombosis of unspecified deep veins of unspecified lower extremity: Secondary | ICD-10-CM

## 2022-12-20 DIAGNOSIS — F02C0 Dementia in other diseases classified elsewhere, severe, without behavioral disturbance, psychotic disturbance, mood disturbance, and anxiety: Secondary | ICD-10-CM

## 2022-12-20 DIAGNOSIS — M159 Polyosteoarthritis, unspecified: Secondary | ICD-10-CM

## 2022-12-20 NOTE — Progress Notes (Signed)
Location:  Other Nursing Home Room Number: 420 A Place of Service:  SNF ([phone])  Burrel Legrand K. Janyth Contes, NP   Patient Care Team: Earnestine Mealing, MD as PCP - General [facility])  Extended Emergency Contact Information Primary Emergency Contact: [name] of [place] Mobile Phone: [phone] Relation: Son Secondary Emergency Contact: [name] Mobile Phone: [phone] Relation: Son  Goals of care: Advanced Directive information    11/16/2022   10:50 AM  Advanced Directives  Does Patient Have a Medical Advance Directive? Yes  Type of Estate agent of [place];Out of facility DNR (pink MOST or yellow form)  Does patient want to make changes to medical advance directive? No - Patient declined  Copy of Healthcare Power of Attorney in Chart? Yes - validated most recent copy scanned in chart (See row information)     Chief Complaint  Patient presents with   Medical Management of Chronic Issues    Routine visit. Discuss need for td/tdap, shingrix, and covid boosters     HPI:  Pt is a 82 y.o. male seen today for medical management of chronic disease. Pt with hx of dementia, htn, depression, OA, recurrent DVT.  Pt with advanced dementia and relies on staff for total care.  Eats well with assistance from staff.  No signs of pain or discomfort.  Nursing without any acute concerns at this time.     Past Medical History:  Diagnosis Date   Acute cholecystitis    Acute metabolic encephalopathy    Alzheimer disease (HCC)    Anxiety    Aortic atherosclerosis (HCC)    Arthritis    Chronic kidney disease    L TOTAL NEPHRECTOMY   History of DVT (deep vein thrombosis)    Hypertension    Sepsis [facility])    Past Surgical History:  Procedure Laterality Date   FRACTURE SURGERY     FRACTURED HIP SURGERY 1960   IR EXCHANGE BILIARY DRAIN  06/10/2021   IR FLUORO PROCEDURE UNLISTED  07/13/2021   IR PERC  CHOLECYSTOSTOMY  04/17/2021   NEPHRECTOMY  1985   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   TOTAL HIP ARTHROPLASTY Left 08/20/2013   Procedure: LEFT TOTAL HIP ARTHROPLASTY ANTERIOR APPROACH;  Surgeon: Shelda Pal, MD;  [facility];  Service: Orthopedics;  Laterality: Left;    Allergies  Allergen Reactions   Donepezil Other (See Comments)    Increased confusion Still listed on pt's current med list as of 06/14/2021   Sulfites     Unable to recall reaction or specific sulfa drug    Outpatient Encounter Medications as of 12/20/2022  Medication Sig   acetaminophen (TYLENOL) 500 MG tablet Take 1,000 mg by mouth every 8 (eight) hours as needed. And three times a day schedule   amLODipine (NORVASC) 5 MG tablet Take 5 mg by mouth daily.   apixaban (ELIQUIS) 5 MG TABS tablet Take 5 mg by mouth 2 (two) times daily.   DULoxetine (CYMBALTA) 60 MG capsule Take 60 mg by mouth daily.   memantine (NAMENDA) 10 MG tablet Take 10 mg by mouth 2 (two) times daily.   Multiple Vitamin (THERA-TABS PO) Take 1 tablet by mouth daily at 6 (six) AM.   senna-docusate (SENNA-S) 8.6-50 MG tablet Take 1 tablet by mouth 2 (two) times daily.   No facility-administered encounter medications on file as of 12/20/2022.    Review of Systems  Unable to perform ROS: Dementia     Immunization  History  Administered Date(s) Administered   Influenza, High Dose Seasonal PF 11/23/2022   Influenza, Seasonal, Injecte, Preservative Fre 11/01/2014, 11/26/2015   Influenza,inj,Quad PF,6+ Mos 11/14/2016   Influenza-Unspecified 11/16/2020, 11/17/2021   Moderna Covid-19 Fall Seasonal Vaccine 29yrs & older 05/10/2022   Moderna Covid-19 Vaccine Bivalent Booster 10yrs & up 10/22/2020   Moderna SARS-COV2 Booster Vaccination 06/19/2020   Moderna Sars-Covid-2 Vaccination 02/10/2019, 03/10/2019, 12/11/2019   PFIZER(Purple Top)SARS-COV-2 Vaccination 12/10/2021   Pneumococcal Conjugate-13 01/02/2014   Pneumococcal Polysaccharide-23  09/02/2003   Tdap 06/08/2011   Unspecified SARS-COV-2 Vaccination 12/11/2019   Zoster Recombinant(Shingrix) 06/09/2022   Pertinent  Health Maintenance Due  Topic Date Due   INFLUENZA VACCINE  Completed      12/20/2021    9:24 PM 12/21/2021    7:30 AM 12/21/2021    8:00 PM 12/22/2021    9:44 AM 06/09/2022   12:45 PM  Fall Risk  Falls in the past year?     Exclusion - non ambulatory  (RETIRED) Patient Fall Risk Level High fall risk High fall risk High fall risk High fall risk    Functional Status Survey:    Vitals:   12/20/22 1521 12/20/22 1551  BP: (!) 155/79 122/84  Pulse: 80   Weight: 178 lb 9.6 oz (81 kg)   Height: 5\' 7"  (1.702 m)    Body mass index is 27.97 kg/m. Wt Readings from Last 3 Encounters:  12/20/22 178 lb 9.6 oz (81 kg)  11/16/22 182 lb 9.6 oz (82.8 kg)  10/19/22 180 lb 6.4 oz (81.8 kg)    Physical Exam Constitutional:      General: He is not in acute distress.    Appearance: He is well-developed. He is not diaphoretic.  HENT:     Head: Normocephalic and atraumatic.     Right Ear: External ear normal.     Left Ear: External ear normal.     Mouth/Throat:     Pharynx: No oropharyngeal exudate.  Eyes:     Conjunctiva/sclera: Conjunctivae normal.     Pupils: Pupils are equal, round, and reactive to light.  Cardiovascular:     Rate and Rhythm: Normal rate and regular rhythm.     Heart sounds: Normal heart sounds.  Pulmonary:     Effort: Pulmonary effort is normal.     Breath sounds: Normal breath sounds.  Abdominal:     General: Bowel sounds are normal.     Palpations: Abdomen is soft.  Musculoskeletal:        General: No tenderness.     Cervical back: Normal range of motion and neck supple.     Right lower leg: No edema.     Left lower leg: No edema.  Skin:    General: Skin is warm and dry.  Neurological:     Mental Status: He is alert. He is disoriented.     Motor: Weakness present.     Gait: Gait abnormal.     Labs reviewed: Recent  Labs    12/22/21 0318 12/30/21 0000 07/02/22 1106 07/03/22 0434  NA 139 137 141 139  K 3.2* 4.6 3.9 3.8  CL 106 103 106 106  CO2 29 31* 29 25  GLUCOSE 97  --  103* 100*  BUN 14 19 19 18   CREATININE 0.97 1.1 1.16 1.06  CALCIUM 8.9 8.7 9.5 9.1   Recent Labs    12/30/21 0000  AST 23  ALT 23  ALKPHOS 72  ALBUMIN 3.3*   Recent Labs  12/22/21 0318 12/30/21 0000 07/02/22 1106 07/03/22 0434 07/04/22 0315  WBC 9.3 6.9 8.8 9.0 10.2  NEUTROABS 5.4 3,540.00  --   --   --   HGB 12.8* 12.4* 13.7 13.1 12.5*  HCT 37.9* 37* 43.4 40.6 38.3*  MCV 85.2  --  89.9 88.3 86.3  PLT 278 342 245 222 227   No results found for: "TSH" No results found for: "HGBA1C" No results found for: "CHOL", "HDL", "LDLCALC", "LDLDIRECT", "TRIG", "CHOLHDL"  Significant Diagnostic Results in last 30 days:  No results found.  Assessment/Plan 1. Essential (primary) hypertension -Blood pressure well controlled, goal bp <140/90 Continue current medications and dietary modifications follow metabolic panel  2. Recurrent major depressive disorder, remission status unspecified (HCC) No signs of worsening depression, continues on cymbalta 60 mg daily  3. 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 Continue supportive care  4. Constipation, unspecified constipation type Controlled on senna-s  5. Recurrent deep vein thrombosis (DVT) (HCC) -continues on eliquis 5 mg BID at this time.  Will follow cbc.   6. Generalized osteoarthritis Continues on tylenol scheduled.    Janene Harvey. Biagio Borg [facility] [phone]

## 2022-12-22 LAB — BASIC METABOLIC PANEL
BUN: 17 (ref 4–21)
CO2: 25 — AB (ref 13–22)
Chloride: 103 (ref 99–108)
Creatinine: 1.1 (ref 0.6–1.3)
Glucose: 97
Potassium: 3.9 meq/L (ref 3.5–5.1)
Sodium: 138 (ref 137–147)

## 2022-12-22 LAB — CBC AND DIFFERENTIAL
HCT: 45 (ref 41–53)
Hemoglobin: 15.1 (ref 13.5–17.5)
Neutrophils Absolute: 5733
Platelets: 305 10*3/uL (ref 150–400)
WBC: 9

## 2022-12-22 LAB — COMPREHENSIVE METABOLIC PANEL
Albumin: 4.2 (ref 3.5–5.0)
Calcium: 9.4 (ref 8.7–10.7)
Globulin: 2.2
eGFR: 69

## 2022-12-22 LAB — CBC: RBC: 5.15 — AB (ref 3.87–5.11)

## 2022-12-22 LAB — HEPATIC FUNCTION PANEL
ALT: 24 U/L (ref 10–40)
AST: 24 (ref 14–40)
Alkaline Phosphatase: 109 (ref 25–125)
Bilirubin, Total: 0.6

## 2023-01-16 ENCOUNTER — Non-Acute Institutional Stay (SKILLED_NURSING_FACILITY): Payer: Self-pay | Admitting: Student

## 2023-01-16 DIAGNOSIS — I10 Essential (primary) hypertension: Secondary | ICD-10-CM | POA: Diagnosis not present

## 2023-01-16 DIAGNOSIS — Z86718 Personal history of other venous thrombosis and embolism: Secondary | ICD-10-CM | POA: Diagnosis not present

## 2023-01-16 DIAGNOSIS — G301 Alzheimer's disease with late onset: Secondary | ICD-10-CM | POA: Diagnosis not present

## 2023-01-16 DIAGNOSIS — L602 Onychogryphosis: Secondary | ICD-10-CM | POA: Diagnosis not present

## 2023-01-16 DIAGNOSIS — F02C0 Dementia in other diseases classified elsewhere, severe, without behavioral disturbance, psychotic disturbance, mood disturbance, and anxiety: Secondary | ICD-10-CM

## 2023-01-16 NOTE — Progress Notes (Signed)
Location:      Place of Service:    Provider:  Judeth Horn, MD  Patient Care Team: Earnestine Mealing, MD as PCP - General [facility] Medicine)  Extended Emergency Contact Information Primary Emergency Contact: [name] of [place] Mobile Phone: [phone] Relation: Son Secondary Emergency Contact: [name] Mobile Phone: [phone] Relation: Son  Code Status:  DNR Goals of care: Advanced Directive information    11/16/2022   10:50 AM  Advanced Directives  Does Patient Have a Medical Advance Directive? Yes  Type of Estate agent of [place];Out of facility DNR (pink MOST or yellow form)  Does patient want to make changes to medical advance directive? No - Patient declined  Copy of Healthcare Power of Attorney in Chart? Yes - validated most recent copy scanned in chart (See row information)     No chief complaint on file.   HPI:  Pt is a 82 y.o. male seen today for medical management of chronic diseases.   Patient smiles and says hello. No other communication  Patient is wheelchair dependent. No recent falls. Requires feeding. Downgraded diet  Nursing concern for overgrown toenails.    Past Medical History:  Diagnosis Date   Acute cholecystitis    Acute metabolic encephalopathy    Alzheimer disease (HCC)    Anxiety    Aortic atherosclerosis (HCC)    Arthritis    Chronic kidney disease    L TOTAL NEPHRECTOMY   History of DVT (deep vein thrombosis)    Hypertension    Sepsis [facility])    Past Surgical History:  Procedure Laterality Date   FRACTURE SURGERY     FRACTURED HIP SURGERY 1960   IR EXCHANGE BILIARY DRAIN  06/10/2021   IR FLUORO PROCEDURE UNLISTED  07/13/2021   IR PERC CHOLECYSTOSTOMY  04/17/2021   NEPHRECTOMY  1985   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   TOTAL HIP ARTHROPLASTY Left 08/20/2013   Procedure: LEFT TOTAL HIP ARTHROPLASTY ANTERIOR APPROACH;  Surgeon: Shelda Pal, MD;  [facility];  Service: Orthopedics;  Laterality: Left;    Allergies  Allergen Reactions   Donepezil Other (See Comments)    Increased confusion Still listed on pt's current med list as of 06/14/2021   Sulfites     Unable to recall reaction or specific sulfa drug    Outpatient Encounter Medications as of 01/16/2023  Medication Sig   acetaminophen (TYLENOL) 500 MG tablet Take 1,000 mg by mouth every 8 (eight) hours as needed. And three times a day schedule   amLODipine (NORVASC) 5 MG tablet Take 5 mg by mouth daily.   apixaban (ELIQUIS) 5 MG TABS tablet Take 5 mg by mouth 2 (two) times daily.   DULoxetine (CYMBALTA) 60 MG capsule Take 60 mg by mouth daily.   memantine (NAMENDA) 10 MG tablet Take 10 mg by mouth 2 (two) times daily.   Multiple Vitamin (THERA-TABS PO) Take 1 tablet by mouth daily at 6 (six) AM.   senna-docusate (SENNA-S) 8.6-50 MG tablet Take 1 tablet by mouth 2 (two) times daily.   No facility-administered encounter medications on file as of 01/16/2023.    Review of Systems  Immunization History  Administered Date(s) Administered   Influenza, High Dose Seasonal PF 11/23/2022   Influenza, Seasonal, Injecte, Preservative Fre 11/01/2014, 11/26/2015   Influenza,inj,Quad PF,6+ Mos 11/14/2016   Influenza-Unspecified 11/16/2020, 11/17/2021   Moderna Covid-19 Fall Seasonal Vaccine 53yrs & older 05/10/2022   Kerry Kass  Vaccine Bivalent Booster 35yrs & up 10/22/2020   Moderna SARS-COV2 Booster Vaccination 06/19/2020   Moderna Sars-Covid-2 Vaccination 02/10/2019, 03/10/2019, 12/11/2019   PFIZER(Purple Top)SARS-COV-2 Vaccination 12/10/2021   Pneumococcal Conjugate-13 01/02/2014   Pneumococcal Polysaccharide-23 09/02/2003   Tdap 06/08/2011   Unspecified SARS-COV-2 Vaccination 12/11/2019   Zoster Recombinant(Shingrix) 06/09/2022   Pertinent  Health Maintenance Due  Topic Date Due   INFLUENZA VACCINE  Completed      12/20/2021    9:24 PM 12/21/2021     7:30 AM 12/21/2021    8:00 PM 12/22/2021    9:44 AM 06/09/2022   12:45 PM  Fall Risk  Falls in the past year?     Exclusion - non ambulatory  (RETIRED) Patient Fall Risk Level High fall risk High fall risk High fall risk High fall risk    Functional Status Survey:    There were no vitals filed for this visit. There is no height or weight on file to calculate BMI. Physical Exam Cardiovascular:     Rate and Rhythm: Normal rate.     Pulses: Normal pulses.  Pulmonary:     Effort: Pulmonary effort is normal.  Skin:    Comments: Overgrown toenails  Neurological:     Mental Status: He is alert. Mental status is at baseline. He is disoriented.     Labs reviewed: Recent Labs    07/02/22 1106 07/03/22 0434  NA 141 139  K 3.9 3.8  CL 106 106  CO2 29 25  GLUCOSE 103* 100*  BUN 19 18  CREATININE 1.16 1.06  CALCIUM 9.5 9.1   No results for input(s): "AST", "ALT", "ALKPHOS", "BILITOT", "PROT", "ALBUMIN" in the last 8760 hours. Recent Labs    07/02/22 1106 07/03/22 0434 07/04/22 0315  WBC 8.8 9.0 10.2  HGB 13.7 13.1 12.5*  HCT 43.4 40.6 38.3*  MCV 89.9 88.3 86.3  PLT 245 222 227   No results found for: "TSH" No results found for: "HGBA1C" No results found for: "CHOL", "HDL", "LDLCALC", "LDLDIRECT", "TRIG", "CHOLHDL"  Significant Diagnostic Results in last 30 days:  No results found.  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 -Continue supportive shoe gear daily. -Normal toenails 10 of nails  bilaterally were debrided in length and girth with sterile nail nippers and dremel without incident. -Patient/POA to call should there be question/concern in the interim. Patient with severe dementia total  care/dependence. Fast stage 7d. Hx of DVT in each leg lifelong anticoagulation. No sign of bleeding at this time.   Family/ staff Communication: nursing  Labs/tests ordered:  none

## 2023-01-17 ENCOUNTER — Encounter: Payer: Self-pay | Admitting: Student

## 2023-02-28 ENCOUNTER — Non-Acute Institutional Stay (SKILLED_NURSING_FACILITY): Payer: Medicare Other | Admitting: Nurse Practitioner

## 2023-02-28 ENCOUNTER — Encounter: Payer: Self-pay | Admitting: Nurse Practitioner

## 2023-02-28 DIAGNOSIS — I10 Essential (primary) hypertension: Secondary | ICD-10-CM | POA: Diagnosis not present

## 2023-02-28 DIAGNOSIS — K5900 Constipation, unspecified: Secondary | ICD-10-CM

## 2023-02-28 DIAGNOSIS — F339 Major depressive disorder, recurrent, unspecified: Secondary | ICD-10-CM

## 2023-02-28 DIAGNOSIS — Z86718 Personal history of other venous thrombosis and embolism: Secondary | ICD-10-CM

## 2023-02-28 DIAGNOSIS — G301 Alzheimer's disease with late onset: Secondary | ICD-10-CM | POA: Diagnosis not present

## 2023-02-28 DIAGNOSIS — F02C0 Dementia in other diseases classified elsewhere, severe, without behavioral disturbance, psychotic disturbance, mood disturbance, and anxiety: Secondary | ICD-10-CM

## 2023-02-28 NOTE — Progress Notes (Signed)
Location:  Other [place].  Nursing Home Room Number: [facility] [address] Place of Service:  SNF (31) Abbey Chatters, NP  PCP: Earnestine Mealing, MD  Patient Care Team: Earnestine Mealing, MD as PCP - General [facility] Medicine)  Extended Emergency Contact Information Primary Emergency Contact: [name] of [place] Mobile Phone: [phone] Relation: Son Secondary Emergency Contact: [name] Mobile Phone: [phone] Relation: Son  Goals of care: Advanced Directive information    02/28/2023   10:06 AM  Advanced Directives  Does Patient Have a Medical Advance Directive? Yes  Type of Estate agent of [place];Out of facility DNR (pink MOST or yellow form)  Does patient want to make changes to medical advance directive? No - Patient declined  Copy of Healthcare Power of Attorney in Chart? Yes - validated most recent copy scanned in chart (See row information)     Chief Complaint  Patient presents with   Medical Management of Chronic Issues    Medical Management of Chronic Issues.     HPI:  Pt is a 83 y.o. male seen today for medical management of chronic disease. Pt with hx of advanced dementia, recurrent DVT, htn, constipation.  Pt is total care.  Staff reports he eats well without difficulty No skin issues noted No signs of pain.  Nursing has no concerns.     Past Medical History:  Diagnosis Date   Acute cholecystitis    Acute metabolic encephalopathy    Alzheimer disease (HCC)    Anxiety    Aortic atherosclerosis (HCC)    Arthritis    Chronic kidney disease    L TOTAL NEPHRECTOMY   History of DVT (deep vein thrombosis)    Hypertension    Sepsis [facility])    Past Surgical History:  Procedure Laterality Date   FRACTURE SURGERY     FRACTURED HIP SURGERY 1960   IR EXCHANGE BILIARY DRAIN  06/10/2021   IR FLUORO PROCEDURE UNLISTED  07/13/2021   IR PERC CHOLECYSTOSTOMY  04/17/2021   NEPHRECTOMY   1985   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   TOTAL HIP ARTHROPLASTY Left 08/20/2013   Procedure: LEFT TOTAL HIP ARTHROPLASTY ANTERIOR APPROACH;  Surgeon: Shelda Pal, MD;  [facility];  Service: Orthopedics;  Laterality: Left;    Allergies  Allergen Reactions   Donepezil Other (See Comments)    Increased confusion Still listed on pt's current med list as of 06/14/2021   Sulfites     Unable to recall reaction or specific sulfa drug    Outpatient Encounter Medications as of 02/28/2023  Medication Sig   acetaminophen (TYLENOL) 500 MG tablet Take 1,000 mg by mouth every 8 (eight) hours as needed. And three times a day schedule   amLODipine (NORVASC) 5 MG tablet Take 5 mg by mouth daily.   apixaban (ELIQUIS) 5 MG TABS tablet Take 5 mg by mouth 2 (two) times daily.   DULoxetine (CYMBALTA) 60 MG capsule Take 60 mg by mouth daily.   memantine (NAMENDA) 10 MG tablet Take 10 mg by mouth 2 (two) times daily.   Multiple Vitamin (THERA-TABS PO) Take 1 tablet by mouth daily at 6 (six) AM.   senna-docusate (SENNA-S) 8.6-50 MG tablet Take 1 tablet by mouth 2 (two) times daily.   No facility-administered encounter medications on file as of 02/28/2023.    Review of Systems  Unable to perform ROS: Dementia     Immunization History  Administered Date(s) Administered   Influenza,  High Dose Seasonal PF 11/23/2022   Influenza, Seasonal, Injecte, Preservative Fre 11/01/2014, 11/26/2015   Influenza,inj,Quad PF,6+ Mos 11/14/2016   Influenza-Unspecified 11/16/2020, 11/17/2021   Moderna Covid-19 Fall Seasonal Vaccine 86yrs & older 05/10/2022   Moderna Covid-19 Vaccine Bivalent Booster 62yrs & up 10/22/2020   Moderna SARS-COV2 Booster Vaccination 06/19/2020   Moderna Sars-Covid-2 Vaccination 02/10/2019, 03/10/2019, 12/11/2019   PFIZER(Purple Top)SARS-COV-2 Vaccination 12/10/2021   Pneumococcal Conjugate-13 01/02/2014   Pneumococcal Polysaccharide-23 09/02/2003   Tdap 06/08/2011   Unspecified  SARS-COV-2 Vaccination 12/11/2019   Zoster Recombinant(Shingrix) 06/09/2022   Pertinent  Health Maintenance Due  Topic Date Due   INFLUENZA VACCINE  Completed      12/20/2021    9:24 PM 12/21/2021    7:30 AM 12/21/2021    8:00 PM 12/22/2021    9:44 AM 06/09/2022   12:45 PM  Fall Risk  Falls in the past year?     Exclusion - non ambulatory  (RETIRED) Patient Fall Risk Level High fall risk High fall risk High fall risk High fall risk    Functional Status Survey:    Vitals:   02/28/23 0959  BP: 116/85  Pulse: 94  Resp: 16  Temp: (!) 97.3 F (36.3 C)  SpO2: 94%  Weight: 183 lb 6.4 oz (83.2 kg)  Height: 5\' 7"  (1.702 m)   Body mass index is 28.72 kg/m. Physical Exam Constitutional:      General: He is not in acute distress.    Appearance: He is well-developed. He is not diaphoretic.  HENT:     Head: Normocephalic and atraumatic.     Right Ear: External ear normal.     Left Ear: External ear normal.     Mouth/Throat:     Pharynx: No oropharyngeal exudate.  Eyes:     Conjunctiva/sclera: Conjunctivae normal.     Pupils: Pupils are equal, round, and reactive to light.  Cardiovascular:     Rate and Rhythm: Normal rate and regular rhythm.     Heart sounds: Normal heart sounds.  Pulmonary:     Effort: Pulmonary effort is normal.     Breath sounds: Normal breath sounds.  Abdominal:     General: Bowel sounds are normal.     Palpations: Abdomen is soft.  Musculoskeletal:        General: No tenderness.     Cervical back: Normal range of motion and neck supple.     Right lower leg: No edema.     Left lower leg: No edema.  Skin:    General: Skin is warm and dry.  Neurological:     Mental Status: He is alert.     Motor: Weakness present.     Gait: Gait abnormal.  Psychiatric:        Cognition and Memory: Cognition is impaired. Memory is impaired. He exhibits impaired recent memory and impaired remote memory.     Labs reviewed: Recent Labs    07/02/22 1106  07/03/22 0434 12/22/22 0000  NA 141 139 138  K 3.9 3.8 3.9  CL 106 106 103  CO2 29 25 25*  GLUCOSE 103* 100*  --   BUN 19 18 17   CREATININE 1.16 1.06 1.1  CALCIUM 9.5 9.1 9.4   Recent Labs    12/22/22 0000  AST 24  ALT 24  ALKPHOS 109  ALBUMIN 4.2   Recent Labs    07/02/22 1106 07/03/22 0434 07/04/22 0315 12/22/22 0000  WBC 8.8 9.0 10.2 9.0  NEUTROABS  --   --   --  5,733.00  HGB 13.7 13.1 12.5* 15.1  HCT 43.4 40.6 38.3* 45  MCV 89.9 88.3 86.3  --   PLT 245 222 227 305   No results found for: "TSH" No results found for: "HGBA1C" No results found for: "CHOL", "HDL", "LDLCALC", "LDLDIRECT", "TRIG", "CHOLHDL"  Significant Diagnostic Results in last 30 days:  No results found.  Assessment/Plan 1. Essential (primary) hypertension (Primary) -Blood pressure well controlled, goal bp <140/90 Continue current medications and dietary modifications follow metabolic panel  2. 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 History of DVT (deep vein thrombosis) -continues on eliquis, no signs of recurrence  4. Recurrent major depressive disorder, remission status unspecified (HCC) -controlled on cymbalta   5. Constipation, unspecified constipation type -well controlled on current regimen.     Janene Harvey. Biagio Borg [facility] [phone]

## 2023-04-12 ENCOUNTER — Encounter: Payer: Self-pay | Admitting: Student

## 2023-04-12 ENCOUNTER — Non-Acute Institutional Stay (SKILLED_NURSING_FACILITY): Payer: Self-pay | Admitting: Student

## 2023-04-12 DIAGNOSIS — Z86718 Personal history of other venous thrombosis and embolism: Secondary | ICD-10-CM

## 2023-04-12 DIAGNOSIS — I10 Essential (primary) hypertension: Secondary | ICD-10-CM | POA: Diagnosis not present

## 2023-04-12 DIAGNOSIS — G301 Alzheimer's disease with late onset: Secondary | ICD-10-CM | POA: Diagnosis not present

## 2023-04-12 DIAGNOSIS — F02C0 Dementia in other diseases classified elsewhere, severe, without behavioral disturbance, psychotic disturbance, mood disturbance, and anxiety: Secondary | ICD-10-CM

## 2023-04-12 DIAGNOSIS — Z741 Need for assistance with personal care: Secondary | ICD-10-CM | POA: Diagnosis not present

## 2023-04-12 DIAGNOSIS — F419 Anxiety disorder, unspecified: Secondary | ICD-10-CM

## 2023-04-12 DIAGNOSIS — E46 Unspecified protein-calorie malnutrition: Secondary | ICD-10-CM

## 2023-04-12 NOTE — Progress Notes (Signed)
 Location[place]  [place].  Nursing Home Room Number: [facility] [address]A Place of Service:  SNF [phone]) Provider:  Earnestine Mealing, MD  Patient Care Team: Earnestine Mealing, MD as PCP - General [facility] Medicine)  Extended Emergency Contact Information Primary Emergency Contact: [name] of [place] Mobile Phone: [phone] Relation: Son Secondary Emergency Contact: [name] Mobile Phone: [phone] Relation: Son  Code Status:  DNR Goals of care: Advanced Directive information    02/28/2023   10:06 AM  Advanced Directives  Does Patient Have a Medical Advance Directive? Yes  Type of Estate agent of [place];Out of facility DNR (pink MOST or yellow form)  Does patient want to make changes to medical advance directive? No - Patient declined  Copy of Healthcare Power of Attorney in Chart? Yes - validated most recent copy scanned in chart (See row information)     Chief Complaint  Patient presents with   Medical Management of Chronic Issues    Medical Management of Chronic Issues.     HPI:  Pt is a 83 y.o. male seen today for medical management of chronic diseases.   Patient with severe, advanced dementia. He is dependent in all ADLs. Non-verbal. Smiles, but does not speak.   Nursing and staff with concern patient is pocketing food during meals. He requires feeding, and is not consistently swallowing foods and medications.   Of note, patient has lost 20 lbs since 02/28/2023  Past Medical History:  Diagnosis Date   Acute cholecystitis    Acute metabolic encephalopathy    Alzheimer disease (HCC)    Anxiety    Aortic atherosclerosis (HCC)    Arthritis    Chronic kidney disease    L TOTAL NEPHRECTOMY   History of DVT (deep vein thrombosis)    Hypertension    Sepsis [facility])    Past Surgical History:  Procedure Laterality Date   FRACTURE SURGERY     FRACTURED HIP SURGERY 1960   IR EXCHANGE BILIARY  DRAIN  06/10/2021   IR FLUORO PROCEDURE UNLISTED  07/13/2021   IR PERC CHOLECYSTOSTOMY  04/17/2021   NEPHRECTOMY  1985   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   TOTAL HIP ARTHROPLASTY Left 08/20/2013   Procedure: LEFT TOTAL HIP ARTHROPLASTY ANTERIOR APPROACH;  Surgeon: Shelda Pal, MD;  [facility];  Service: Orthopedics;  Laterality: Left;    Allergies  Allergen Reactions   Donepezil Other (See Comments)    Increased confusion Still listed on pt's current med list as of 06/14/2021   Sulfites     Unable to recall reaction or specific sulfa drug    Outpatient Encounter Medications as of 04/12/2023  Medication Sig   acetaminophen (TYLENOL) 500 MG tablet Take 1,000 mg by mouth every 8 (eight) hours as needed. And three times a day schedule   amLODipine (NORVASC) 5 MG tablet Take 5 mg by mouth daily.   apixaban (ELIQUIS) 5 MG TABS tablet Take 5 mg by mouth 2 (two) times daily.   DULoxetine (CYMBALTA) 30 MG capsule Take 30 mg by mouth. Every 48 hours for 2weeks and then every other day for 2 weeks then stop.   memantine (NAMENDA) 10 MG tablet Take 10 mg by mouth 2 (two) times daily.   Multiple Vitamin (THERA-TABS PO) Take 1 tablet by mouth daily at 6 (six) AM.   senna-docusate (SENNA-S) 8.6-50 MG tablet Take 1 tablet by mouth 2 (two) times daily.   [DISCONTINUED] DULoxetine (CYMBALTA) 60  MG capsule Take 60 mg by mouth daily.   No facility-administered encounter medications on file as of 04/12/2023.    Review of Systems  Immunization History  Administered Date(s) Administered   Influenza, High Dose Seasonal PF 11/23/2022   Influenza, Seasonal, Injecte, Preservative Fre 11/01/2014, 11/26/2015   Influenza,inj,Quad PF,6+ Mos 11/14/2016   Influenza-Unspecified 11/16/2020, 11/17/2021   Moderna Covid-19 Fall Seasonal Vaccine 31yrs & older 05/10/2022   Moderna Covid-19 Vaccine Bivalent Booster 90yrs & up 10/22/2020   Moderna SARS-COV2 Booster Vaccination 06/19/2020   Moderna Sars-Covid-2  Vaccination 02/10/2019, 03/10/2019, 12/11/2019   PFIZER(Purple Top)SARS-COV-2 Vaccination 12/10/2021   Pneumococcal Conjugate-13 01/02/2014   Pneumococcal Polysaccharide-23 09/02/2003   Tdap 06/08/2011   Unspecified SARS-COV-2 Vaccination 12/11/2019   Zoster Recombinant(Shingrix) 06/09/2022   Pertinent  Health Maintenance Due  Topic Date Due   INFLUENZA VACCINE  Completed      12/20/2021    9:24 PM 12/21/2021    7:30 AM 12/21/2021    8:00 PM 12/22/2021    9:44 AM 06/09/2022   12:45 PM  Fall Risk  Falls in the past year?     Exclusion - non ambulatory  (RETIRED) Patient Fall Risk Level High fall risk High fall risk High fall risk High fall risk    Functional Status Survey:    Vitals:   04/12/23 1139 04/12/23 1219  BP: (!) 156/83 132/87  Pulse: 72   Resp: 20   Temp: (!) 97.2 F (36.2 C)   SpO2: 95%   Weight: 165 lb 3.2 oz (74.9 kg)   Height: 5\' 7"  (1.702 m)    Body mass index is 25.87 kg/m. Physical Exam Cardiovascular:     Rate and Rhythm: Normal rate.  Pulmonary:     Effort: Pulmonary effort is normal.  Neurological:     Mental Status: He is alert.     Labs reviewed: Recent Labs    07/02/22 1106 07/03/22 0434 12/22/22 0000  NA 141 139 138  K 3.9 3.8 3.9  CL 106 106 103  CO2 29 25 25*  GLUCOSE 103* 100*  --   BUN 19 18 17   CREATININE 1.16 1.06 1.1  CALCIUM 9.5 9.1 9.4   Recent Labs    12/22/22 0000  AST 24  ALT 24  ALKPHOS 109  ALBUMIN 4.2   Recent Labs    07/02/22 1106 07/03/22 0434 07/04/22 0315 12/22/22 0000  WBC 8.8 9.0 10.2 9.0  NEUTROABS  --   --   --  5,733.00  HGB 13.7 13.1 12.5* 15.1  HCT 43.4 40.6 38.3* 45  MCV 89.9 88.3 86.3  --   PLT 245 222 227 305   No results found for: "TSH" No results found for: "HGBA1C" No results found for: "CHOL", "HDL", "LDLCALC", "LDLDIRECT", "TRIG", "CHOLHDL"  Significant Diagnostic Results in last 30 days:  No results found.  Assessment/Plan 1. 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 With wieght loss, high risk for continued decline. Goals of care outlined on MOST form for comfort measures only. To Discuss hospice with family.   2. Essential (primary) hypertension BP well-controlled without medications.   3. Need for assistance with personal care Total Care, continue supportive care.   4. History of DVT (deep vein thrombosis) Medication non-adherence. Continue at this time. Will continue at this time.    Family/ staff Communication: nursing  Labs/tests ordered:  none

## 2023-04-17 DIAGNOSIS — E46 Unspecified protein-calorie malnutrition: Secondary | ICD-10-CM | POA: Insufficient documentation

## 2023-06-01 ENCOUNTER — Encounter: Payer: Self-pay | Admitting: Nurse Practitioner

## 2023-06-01 ENCOUNTER — Non-Acute Institutional Stay (SKILLED_NURSING_FACILITY): Payer: Self-pay | Admitting: Nurse Practitioner

## 2023-06-01 DIAGNOSIS — I10 Essential (primary) hypertension: Secondary | ICD-10-CM | POA: Diagnosis not present

## 2023-06-01 DIAGNOSIS — Z86718 Personal history of other venous thrombosis and embolism: Secondary | ICD-10-CM

## 2023-06-01 DIAGNOSIS — K5900 Constipation, unspecified: Secondary | ICD-10-CM

## 2023-06-01 DIAGNOSIS — E46 Unspecified protein-calorie malnutrition: Secondary | ICD-10-CM

## 2023-06-01 DIAGNOSIS — G301 Alzheimer's disease with late onset: Secondary | ICD-10-CM

## 2023-06-01 DIAGNOSIS — F02C0 Dementia in other diseases classified elsewhere, severe, without behavioral disturbance, psychotic disturbance, mood disturbance, and anxiety: Secondary | ICD-10-CM

## 2023-06-01 LAB — CBC AND DIFFERENTIAL
HCT: 45 (ref 41–53)
Hemoglobin: 14.6 (ref 13.5–17.5)
Neutrophils Absolute: 6025
Platelets: 342 K/uL (ref 150–400)
WBC: 9.4

## 2023-06-01 LAB — BASIC METABOLIC PANEL WITH GFR
BUN: 19 (ref 4–21)
CO2: 28 — AB (ref 13–22)
Chloride: 104 (ref 99–108)
Creatinine: 1.1 (ref 0.6–1.3)
Glucose: 89
Potassium: 3.9 meq/L (ref 3.5–5.1)
Sodium: 142 (ref 137–147)

## 2023-06-01 LAB — COMPREHENSIVE METABOLIC PANEL WITH GFR
Albumin: 4.2 (ref 3.5–5.0)
Calcium: 9.5 (ref 8.7–10.7)
Globulin: 2.3
eGFR: 65

## 2023-06-01 LAB — CBC: RBC: 4.96 (ref 3.87–5.11)

## 2023-06-01 LAB — HEPATIC FUNCTION PANEL
ALT: 12 U/L (ref 10–40)
AST: 16 (ref 14–40)
Alkaline Phosphatase: 96 (ref 25–125)
Bilirubin, Total: 0.6

## 2023-06-01 NOTE — Progress Notes (Signed)
 Location:  Other [place].  Nursing Home Room Number: [facility] [address] Place of Service:  SNF ([phone]) [facility], NP  PCP: Valrie Gehrig, MD  Patient Care Team: Valrie Gehrig, MD as PCP - General [facility] Medicine)  Extended Emergency Contact Information Primary Emergency Contact: Sprung,Jeff  [place] Mobile Phone: [phone] Relation: Son Secondary Emergency Contact: Colonna,Mike  [place] Mobile Phone: [phone] Relation: Son  Goals of care: Advanced Directive information    06/01/2023    1:05 PM  Advanced Directives  Does Patient Have a Medical Advance Directive? Yes  Type of Estate agent of [place];Out of facility DNR (pink MOST or yellow form)  Does patient want to make changes to medical advance directive? No - Patient declined  Copy of Healthcare Power of Attorney in Chart? Yes - validated most recent copy scanned in chart (See row information)     Chief Complaint  Patient presents with   Medical Management of Chronic Issues    Medical Management of Chronic Issues.     HPI:  Pt is a 83 y.o. male seen today for medical management of chronic disease.   Pt with advanced dementia Nonverbal and requiring total care.  He does not appear to be in pain Staff has no concerns at this time He is comfort care.   Past Medical History:  Diagnosis Date   Acute cholecystitis    Acute metabolic encephalopathy    Alzheimer disease (HCC)    Anxiety    Aortic atherosclerosis (HCC)    Arthritis    Chronic kidney disease    L TOTAL NEPHRECTOMY   History of DVT (deep vein thrombosis)    Hypertension    Sepsis [facility])    Past Surgical History:  Procedure Laterality Date   FRACTURE SURGERY     FRACTURED HIP SURGERY 1960   IR EXCHANGE BILIARY DRAIN  06/10/2021   IR FLUORO PROCEDURE UNLISTED  07/13/2021   IR PERC CHOLECYSTOSTOMY  04/17/2021   NEPHRECTOMY  1985   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   TOTAL  HIP ARTHROPLASTY Left 08/20/2013   Procedure: LEFT TOTAL HIP ARTHROPLASTY ANTERIOR APPROACH;  Surgeon: Bevin Bucks, MD;  [facility];  Service: Orthopedics;  Laterality: Left;    Allergies  Allergen Reactions   Donepezil  Other (See Comments)    Increased confusion Still listed on pt's current med list as of 06/14/2021   Sulfites     Unable to recall reaction or specific sulfa drug    Outpatient Encounter Medications as of 06/01/2023  Medication Sig   acetaminophen  (TYLENOL ) 500 MG tablet Take 1,000 mg by mouth 3 (three) times daily.   amLODipine  (NORVASC ) 5 MG tablet Take 5 mg by mouth daily.   apixaban  (ELIQUIS ) 5 MG TABS tablet Take 5 mg by mouth 2 (two) times daily.   Multiple Vitamin (THERA-TABS PO) Take 1 tablet by mouth daily at 6 (six) AM.   senna-docusate (SENNA-S) 8.6-50 MG tablet Take 1 tablet by mouth 2 (two) times daily.   Zinc Oxide (TRIPLE PASTE) 12.8 % ointment Apply 1 Application topically. Every shift.   [DISCONTINUED] DULoxetine  (CYMBALTA ) 30 MG capsule Take 30 mg by mouth. Every 48 hours for 2weeks and then every other day for 2 weeks then stop. (Patient not taking: Reported on 06/01/2023)   [DISCONTINUED] memantine  (NAMENDA ) 10 MG tablet Take 10 mg by mouth 2 (two) times daily. (Patient not taking: Reported on 06/01/2023)   No facility-administered encounter medications on  file as of 06/01/2023.    Review of Systems  Unable to perform ROS: Dementia     Immunization History  Administered Date(s) Administered   Influenza, High Dose Seasonal PF 11/23/2022   Influenza, Seasonal, Injecte, Preservative Fre 11/01/2014, 11/26/2015   Influenza,inj,Quad PF,6+ Mos 11/14/2016   Influenza-Unspecified 11/16/2020, 11/17/2021   Moderna Covid-19 Fall Seasonal Vaccine 25yrs & older 05/10/2022   Moderna Covid-19 Vaccine Bivalent Booster 44yrs & up 10/22/2020   Moderna SARS-COV2 Booster Vaccination 06/19/2020   Moderna Sars-Covid-2 Vaccination 02/10/2019, 03/10/2019, 12/11/2019    PFIZER(Purple Top)SARS-COV-2 Vaccination 12/10/2021   Pneumococcal Conjugate-13 01/02/2014   Pneumococcal Polysaccharide-23 09/02/2003   Tdap 06/08/2011   Unspecified SARS-COV-2 Vaccination 12/11/2019, 05/12/2023   Zoster Recombinant(Shingrix) 06/09/2022   Pertinent  Health Maintenance Due  Topic Date Due   INFLUENZA VACCINE  09/01/2023      12/20/2021    9:24 PM 12/21/2021    7:30 AM 12/21/2021    8:00 PM 12/22/2021    9:44 AM 06/09/2022   12:45 PM  Fall Risk  Falls in the past year?     Exclusion - non ambulatory  (RETIRED) Patient Fall Risk Level High fall risk High fall risk High fall risk High fall risk    Functional Status Survey:    Vitals:   06/01/23 1300  BP: 113/70  Pulse: 89  Resp: 18  Temp: (!) 97.5 F (36.4 C)  SpO2: 95%  Weight: 168 lb (76.2 kg)  Height: 5\' 7"  (1.702 m)   Body mass index is 26.31 kg/m. Physical Exam Constitutional:      General: He is not in acute distress.    Appearance: He is well-developed. He is not diaphoretic.  HENT:     Head: Normocephalic and atraumatic.     Mouth/Throat:     Pharynx: No oropharyngeal exudate.  Eyes:     Conjunctiva/sclera: Conjunctivae normal.     Pupils: Pupils are equal, round, and reactive to light.  Cardiovascular:     Rate and Rhythm: Normal rate and regular rhythm.     Heart sounds: Normal heart sounds.  Pulmonary:     Effort: Pulmonary effort is normal.     Breath sounds: Normal breath sounds.  Abdominal:     General: Bowel sounds are normal.     Palpations: Abdomen is soft.  Musculoskeletal:        General: No tenderness.     Cervical back: Normal range of motion and neck supple.  Skin:    General: Skin is warm and dry.  Neurological:     Mental Status: He is alert.     Motor: Weakness present.     Gait: Gait abnormal.     Labs reviewed: Recent Labs    07/02/22 1106 07/03/22 0434 12/22/22 0000  NA 141 139 138  K 3.9 3.8 3.9  CL 106 106 103  CO2 29 25 25*  GLUCOSE 103* 100*   --   BUN 19 18 17   CREATININE 1.16 1.06 1.1  CALCIUM 9.5 9.1 9.4   Recent Labs    12/22/22 0000  AST 24  ALT 24  ALKPHOS 109  ALBUMIN 4.2   Recent Labs    07/02/22 1106 07/03/22 0434 07/04/22 0315 12/22/22 0000  WBC 8.8 9.0 10.2 9.0  NEUTROABS  --   --   --  5,733.00  HGB 13.7 13.1 12.5* 15.1  HCT 43.4 40.6 38.3* 45  MCV 89.9 88.3 86.3  --   PLT 245 222 227 305   No  results found for: "TSH" No results found for: "HGBA1C" No results found for: "CHOL", "HDL", "LDLCALC", "LDLDIRECT", "TRIG", "CHOLHDL"  Significant Diagnostic Results in last 30 days:  No results found.  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   Protein deficiency (HCC) Expect to worse due to advanced dementia. Continues with supportive care  History of DVT (deep vein thrombosis) Stable, continues on eliquis  BID  Essential (primary) hypertension Blood pressure well controlled Continue current medications  follow metabolic panel   Constipation Stable on current regimen.      Darl Kuss K. Denney Fisherman [facility] [phone]

## 2023-06-02 DIAGNOSIS — K5900 Constipation, unspecified: Secondary | ICD-10-CM | POA: Insufficient documentation

## 2023-06-02 NOTE — Assessment & Plan Note (Signed)
 Blood pressure well controlled Continue current medications  follow metabolic panel

## 2023-06-02 NOTE — Assessment & Plan Note (Signed)
 Advanced disease, total care at facility.

## 2023-06-02 NOTE — Assessment & Plan Note (Signed)
 Stable on current regimen

## 2023-06-02 NOTE — Assessment & Plan Note (Signed)
 Stable, continues on eliquis  BID

## 2023-06-02 NOTE — Assessment & Plan Note (Signed)
 Expect to worse due to advanced dementia. Continues with supportive care

## 2023-06-16 ENCOUNTER — Other Ambulatory Visit: Payer: Self-pay | Admitting: Student

## 2023-06-16 DIAGNOSIS — Z515 Encounter for palliative care: Secondary | ICD-10-CM | POA: Insufficient documentation

## 2023-06-16 MED ORDER — LORAZEPAM 0.5 MG PO TABS
0.5000 mg | ORAL_TABLET | Freq: Three times a day (TID) | ORAL | 0 refills | Status: DC | PRN
Start: 2023-06-16 — End: 2023-09-28

## 2023-06-16 MED ORDER — MORPHINE SULFATE (CONCENTRATE) 20 MG/ML PO SOLN
5.0000 mg | ORAL | 0 refills | Status: DC | PRN
Start: 2023-06-16 — End: 2023-09-28

## 2023-06-16 NOTE — Progress Notes (Signed)
 Comfort medications ordered.

## 2023-08-16 ENCOUNTER — Non-Acute Institutional Stay (SKILLED_NURSING_FACILITY): Payer: Self-pay | Admitting: Student

## 2023-08-16 ENCOUNTER — Encounter: Payer: Self-pay | Admitting: Student

## 2023-08-16 DIAGNOSIS — K5901 Slow transit constipation: Secondary | ICD-10-CM

## 2023-08-16 DIAGNOSIS — G301 Alzheimer's disease with late onset: Secondary | ICD-10-CM | POA: Diagnosis not present

## 2023-08-16 DIAGNOSIS — Z86718 Personal history of other venous thrombosis and embolism: Secondary | ICD-10-CM | POA: Diagnosis not present

## 2023-08-16 DIAGNOSIS — I10 Essential (primary) hypertension: Secondary | ICD-10-CM | POA: Diagnosis not present

## 2023-08-16 DIAGNOSIS — F02C0 Dementia in other diseases classified elsewhere, severe, without behavioral disturbance, psychotic disturbance, mood disturbance, and anxiety: Secondary | ICD-10-CM

## 2023-08-16 NOTE — Progress Notes (Signed)
 Location:  Other [facility].  Nursing Home Room Number: [facility] [address]A Place of Service:  SNF [phone]) Provider:  Abdul Fine, MD  Patient Care Team: Abdul Fine, MD as PCP - General [facility] Medicine)  Extended Emergency Contact Information Primary Emergency Contact: Cordts,Jeff  [place] Mobile Phone: [phone] Relation: Son Secondary Emergency Contact: Pavlovich,Mike  [place] Mobile Phone: [phone] Relation: Son  Code Status:  DNR Goals of care: Advanced Directive information    08/16/2023   10:48 AM  Advanced Directives  Does Patient Have a Medical Advance Directive? Yes  Type of Estate agent of [place];Out of facility DNR (pink MOST or yellow form)  Does patient want to make changes to medical advance directive? No - Patient declined  Copy of Healthcare Power of Attorney in Chart? Yes - validated most recent copy scanned in chart (See row information)     Chief Complaint  Patient presents with   Medical Management of Chronic Issues    HPI:  Pt is a 83 y.o. male seen today for medical management of chronic diseases.   Patient is having dinner at this time. He smiles and says he's fine. He is minimally conversational at baseline. Receives total care from nursing staff. He is being fed by nursing staff. Nursing state they have no concerns at this time.   Past Medical History:  Diagnosis Date   Acute cholecystitis    Acute metabolic encephalopathy    Alzheimer disease (HCC)    Anxiety    Aortic atherosclerosis (HCC)    Arthritis    Chronic kidney disease    L TOTAL NEPHRECTOMY   History of DVT (deep vein thrombosis)    Hypertension    Sepsis [facility])    Past Surgical History:  Procedure Laterality Date   FRACTURE SURGERY     FRACTURED HIP SURGERY 1960   IR EXCHANGE BILIARY DRAIN  06/10/2021   IR FLUORO PROCEDURE UNLISTED  07/13/2021   IR PERC CHOLECYSTOSTOMY  04/17/2021   NEPHRECTOMY   1985   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   TOTAL HIP ARTHROPLASTY Left 08/20/2013   Procedure: LEFT TOTAL HIP ARTHROPLASTY ANTERIOR APPROACH;  Surgeon: Donnice JONETTA Car, MD;  Location: [facility];  Service: Orthopedics;  Laterality: Left;    Allergies  Allergen Reactions   Donepezil  Other (See Comments)    Increased confusion Still listed on pt's current med list as of 06/14/2021   Sulfites     Unable to recall reaction or specific sulfa drug    Outpatient Encounter Medications as of 08/16/2023  Medication Sig   acetaminophen  (TYLENOL ) 500 MG tablet Take 1,000 mg by mouth 3 (three) times daily.   amLODipine  (NORVASC ) 5 MG tablet Take 5 mg by mouth daily.   apixaban  (ELIQUIS ) 5 MG TABS tablet Take 5 mg by mouth 2 (two) times daily.   Multiple Vitamin (THERA-TABS PO) Take 1 tablet by mouth daily at 6 (six) AM.   senna-docusate (SENNA-S) 8.6-50 MG tablet Take 1 tablet by mouth 2 (two) times daily.   Zinc Oxide (TRIPLE PASTE) 12.8 % ointment Apply 1 Application topically. Every shift.   LORazepam  (ATIVAN ) 0.5 MG tablet Take 1 tablet (0.5 mg total) by mouth every 8 (eight) hours as needed for anxiety (AGITATION). (Patient not taking: Reported on 08/16/2023)   morphine  (ROXANOL) 20 MG/ML concentrated solution Take 0.25 mLs (5 mg total) by mouth every 4 (four) hours as needed for severe pain (pain score 7-10), breakthrough pain,  anxiety or shortness of breath. (Patient not taking: Reported on 08/16/2023)   No facility-administered encounter medications on file as of 08/16/2023.    Review of Systems  Immunization History  Administered Date(s) Administered   Influenza, High Dose Seasonal PF 11/23/2022   Influenza, Seasonal, Injecte, Preservative Fre 11/01/2014, 11/26/2015   Influenza,inj,Quad PF,6+ Mos 11/14/2016   Influenza-Unspecified 11/16/2020, 11/17/2021   Moderna Covid-19 Fall Seasonal Vaccine 70yrs & older 05/10/2022   Moderna Covid-19 Vaccine Bivalent Booster 15yrs & up 10/22/2020   Moderna  SARS-COV2 Booster Vaccination 06/19/2020   Moderna Sars-Covid-2 Vaccination 02/10/2019, 03/10/2019, 12/11/2019   PFIZER(Purple Top)SARS-COV-2 Vaccination 12/10/2021   Pneumococcal Conjugate-13 01/02/2014   Pneumococcal Polysaccharide-23 09/02/2003   Tdap 06/08/2011   Unspecified SARS-COV-2 Vaccination 12/11/2019, 05/12/2023   Zoster Recombinant(Shingrix) 06/09/2022   Pertinent  Health Maintenance Due  Topic Date Due   INFLUENZA VACCINE  09/01/2023      12/20/2021    9:24 PM 12/21/2021    7:30 AM 12/21/2021    8:00 PM 12/22/2021    9:44 AM 06/09/2022   12:45 PM  Fall Risk  Falls in the past year?     Exclusion - non ambulatory  (RETIRED) Patient Fall Risk Level High fall risk  High fall risk  High fall risk  High fall risk       Data saved with a previous flowsheet row definition   Functional Status Survey:    Vitals:   08/16/23 1040  BP: 114/77  Pulse: 88  Resp: 16  Temp: 97.8 F (36.6 C)  SpO2: 98%  Weight: 177 lb 12.8 oz (80.6 kg)  Height: 5' 7 (1.702 m)   Body mass index is 27.85 kg/m. Physical Exam  Labs reviewed: Recent Labs    12/22/22 0000 06/01/23 0000  NA 138 142  K 3.9 3.9  CL 103 104  CO2 25* 28*  BUN 17 19  CREATININE 1.1 1.1  CALCIUM 9.4 9.5   Recent Labs    12/22/22 0000 06/01/23 0000  AST 24 16  ALT 24 12  ALKPHOS 109 96  ALBUMIN 4.2 4.2   Recent Labs    12/22/22 0000 06/01/23 0000  WBC 9.0 9.4  NEUTROABS 5,733.00 6,025.00  HGB 15.1 14.6  HCT 45 45  PLT 305 342   No results found for: TSH No results found for: HGBA1C No results found for: CHOL, HDL, LDLCALC, LDLDIRECT, TRIG, CHOLHDL  Significant Diagnostic Results in last 30 days:  No results found.  Assessment/Plan Slow transit constipation  History of DVT (deep vein thrombosis)  Essential (primary) hypertension  Severe late onset Alzheimer's dementia without behavioral disturbance, psychotic disturbance, mood disturbance, or anxiety  (HCC) Patient with severe dementia. Has had periods of holding food in his mouth. Weight appears to be stable at this time. Requires feeding. No sign of DVT at this time and will continue medications given 2 different DVTs. BP well-controlled without intervention.   Family/ staff Communication: nursing  Labs/tests ordered:  none

## 2023-08-20 ENCOUNTER — Encounter: Payer: Self-pay | Admitting: Student

## 2023-09-28 ENCOUNTER — Non-Acute Institutional Stay (SKILLED_NURSING_FACILITY): Payer: Self-pay | Admitting: Nurse Practitioner

## 2023-09-28 ENCOUNTER — Encounter: Payer: Self-pay | Admitting: Nurse Practitioner

## 2023-09-28 DIAGNOSIS — K5901 Slow transit constipation: Secondary | ICD-10-CM | POA: Diagnosis not present

## 2023-09-28 DIAGNOSIS — I10 Essential (primary) hypertension: Secondary | ICD-10-CM | POA: Diagnosis not present

## 2023-09-28 DIAGNOSIS — F02C0 Dementia in other diseases classified elsewhere, severe, without behavioral disturbance, psychotic disturbance, mood disturbance, and anxiety: Secondary | ICD-10-CM

## 2023-09-28 DIAGNOSIS — Z86718 Personal history of other venous thrombosis and embolism: Secondary | ICD-10-CM | POA: Diagnosis not present

## 2023-09-28 DIAGNOSIS — G301 Alzheimer's disease with late onset: Secondary | ICD-10-CM

## 2023-09-28 NOTE — Assessment & Plan Note (Signed)
 Blood pressure well controlled Continue current medications  follow metabolic panel

## 2023-09-28 NOTE — Assessment & Plan Note (Signed)
 Advanced disease, total care at facility.  Continue supportive care and hospice services.

## 2023-09-28 NOTE — Assessment & Plan Note (Signed)
 Stable on current regimen

## 2023-09-28 NOTE — Assessment & Plan Note (Signed)
 Stable, continues on eliquis  BID No signs of recurrent DVT on medication

## 2023-09-28 NOTE — Progress Notes (Signed)
 Location:  Other [facility] [place].  Nursing Home Room Number: [facility] [address]A Place of Service:  SNF [phone]) Harlene An, NP  PCP: Abdul Fine, MD  Patient Care Team: Abdul Fine, MD as PCP - General [facility] Medicine)  Extended Emergency Contact Information Primary Emergency Contact: Locy,Jeff  [place] Mobile Phone: [phone] Relation: Son Secondary Emergency Contact: Heier,Mike  [place] Mobile Phone: [phone] Relation: Son  Goals of care: Advanced Directive information    08/16/2023   10:48 AM  Advanced Directives  Does Patient Have a Medical Advance Directive? Yes  Type of Estate agent of [place];Out of facility DNR (pink MOST or yellow form)  Does patient want to make changes to medical advance directive? No - Patient declined  Copy of Healthcare Power of Attorney in Chart? Yes - validated most recent copy scanned in chart (See row information)     Chief Complaint  Patient presents with   Medical Management of Chronic Issues    Medical Management of Chronic Issues.     HPI:  Pt is a 83 y.o. male seen today for medical management of chronic disease. Pt with advanced dementia on hospice. Requires total care by staff.  No skin issues. No reports of pain or anxiety Eats well and has actually gained weight over the last few months.  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    Past Medical History:  Diagnosis Date   Acute cholecystitis    Acute metabolic encephalopathy    Alzheimer disease (HCC)    Anxiety    Aortic atherosclerosis (HCC)    Arthritis    Chronic kidney disease    L TOTAL NEPHRECTOMY   History of DVT (deep vein thrombosis)    Hypertension    Sepsis [facility])    Past Surgical History:  Procedure Laterality Date   FRACTURE SURGERY     FRACTURED HIP SURGERY 1960   IR EXCHANGE BILIARY DRAIN  06/10/2021   IR FLUORO PROCEDURE UNLISTED   07/13/2021   IR PERC CHOLECYSTOSTOMY  04/17/2021   NEPHRECTOMY  1985   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   TOTAL HIP ARTHROPLASTY Left 08/20/2013   Procedure: LEFT TOTAL HIP ARTHROPLASTY ANTERIOR APPROACH;  Surgeon: Donnice JONETTA Car, MD;  [facility];  Service: Orthopedics;  Laterality: Left;    Allergies  Allergen Reactions   Donepezil  Other (See Comments)    Increased confusion Still listed on pt's current med list as of 06/14/2021   Sulfites     Unable to recall reaction or specific sulfa drug    Outpatient Encounter Medications as of 09/28/2023  Medication Sig   acetaminophen  (TYLENOL ) 500 MG tablet Take 1,000 mg by mouth 3 (three) times daily.   amLODipine  (NORVASC ) 5 MG tablet Take 5 mg by mouth daily.   apixaban  (ELIQUIS ) 5 MG TABS tablet Take 5 mg by mouth 2 (two) times daily.   Multiple Vitamin (THERA-TABS PO) Take 1 tablet by mouth daily at 6 (six) AM.   senna-docusate (SENNA-S) 8.6-50 MG tablet Take 1 tablet by mouth 2 (two) times daily.   Zinc Oxide (TRIPLE PASTE) 12.8 % ointment Apply 1 Application topically. Every shift.   [DISCONTINUED] LORazepam  (ATIVAN ) 0.5 MG tablet Take 1 tablet (0.5 mg total) by mouth every 8 (eight) hours as needed for anxiety (AGITATION). (Patient not taking: Reported on 09/28/2023)   [DISCONTINUED] morphine  (ROXANOL) 20 MG/ML concentrated solution Take 0.25 mLs (5 mg total) by mouth every  4 (four) hours as needed for severe pain (pain score 7-10), breakthrough pain, anxiety or shortness of breath. (Patient not taking: Reported on 09/28/2023)   No facility-administered encounter medications on file as of 09/28/2023.    Review of Systems  Unable to perform ROS: Dementia     Immunization History  Administered Date(s) Administered   INFLUENZA, HIGH DOSE SEASONAL PF 11/23/2022   Influenza, Seasonal, Injecte, Preservative Fre 11/01/2014, 11/26/2015   Influenza,inj,Quad PF,6+ Mos 11/14/2016   Influenza-Unspecified 11/16/2020, 11/17/2021   Moderna  Covid-19 Fall Seasonal Vaccine 38yrs & older 05/10/2022   Moderna Covid-19 Vaccine Bivalent Booster 33yrs & up 10/22/2020   Moderna SARS-COV2 Booster Vaccination 06/19/2020   Moderna Sars-Covid-2 Vaccination 02/10/2019, 03/10/2019, 12/11/2019   PFIZER(Purple Top)SARS-COV-2 Vaccination 12/10/2021   Pneumococcal Conjugate-13 01/02/2014   Pneumococcal Polysaccharide-23 09/02/2003   Tdap 06/08/2011   Unspecified SARS-COV-2 Vaccination 12/11/2019, 05/12/2023   Zoster Recombinant(Shingrix) 06/09/2022   Pertinent  Health Maintenance Due  Topic Date Due   INFLUENZA VACCINE  09/01/2023      12/20/2021    9:24 PM 12/21/2021    7:30 AM 12/21/2021    8:00 PM 12/22/2021    9:44 AM 06/09/2022   12:45 PM  Fall Risk  Falls in the past year?     Exclusion - non ambulatory  (RETIRED) Patient Fall Risk Level High fall risk  High fall risk  High fall risk  High fall risk       Data saved with a previous flowsheet row definition   Functional Status Survey:    Vitals:   09/28/23 1200 09/28/23 1204  BP: (!) 135/92 (!) 143/87  Pulse: 83   Resp: 15   Temp: 97.6 F (36.4 C)   SpO2: 94%   Weight: 179 lb (81.2 kg)   Height: 5' 7 (1.702 m)    Body mass index is 28.04 kg/m. Physical Exam Constitutional:      General: He is not in acute distress.    Appearance: He is well-developed. He is not diaphoretic.  HENT:     Head: Normocephalic and atraumatic.     Right Ear: External ear normal.     Left Ear: External ear normal.     Mouth/Throat:     Pharynx: No oropharyngeal exudate.  Eyes:     Conjunctiva/sclera: Conjunctivae normal.     Pupils: Pupils are equal, round, and reactive to light.  Cardiovascular:     Rate and Rhythm: Normal rate and regular rhythm.     Heart sounds: Normal heart sounds.  Pulmonary:     Effort: Pulmonary effort is normal.     Breath sounds: Normal breath sounds.  Abdominal:     General: Bowel sounds are normal.     Palpations: Abdomen is soft.   Musculoskeletal:        General: No tenderness.     Cervical back: Normal range of motion and neck supple.     Right lower leg: No edema.     Left lower leg: No edema.  Skin:    General: Skin is warm and dry.  Neurological:     Motor: Weakness present.     Gait: Gait abnormal.     Labs reviewed: Recent Labs    12/22/22 0000 06/01/23 0000  NA 138 142  K 3.9 3.9  CL 103 104  CO2 25* 28*  BUN 17 19  CREATININE 1.1 1.1  CALCIUM 9.4 9.5   Recent Labs    12/22/22 0000 06/01/23 0000  AST 24 16  ALT 24 12  ALKPHOS 109 96  ALBUMIN 4.2 4.2   Recent Labs    12/22/22 0000 06/01/23 0000  WBC 9.0 9.4  NEUTROABS 5,733.00 6,025.00  HGB 15.1 14.6  HCT 45 45  PLT 305 342   No results found for: TSH No results found for: HGBA1C No results found for: CHOL, HDL, LDLCALC, LDLDIRECT, TRIG, CHOLHDL  Significant Diagnostic Results in last 30 days:  No results found.  Assessment/Plan Constipation Stable on current regimen.   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  Continue supportive care and hospice services.      Kira Hartl K. Caro BODILY [facility] [phone]

## 2023-11-23 ENCOUNTER — Non-Acute Institutional Stay (SKILLED_NURSING_FACILITY): Payer: Self-pay | Admitting: Nurse Practitioner

## 2023-11-23 ENCOUNTER — Encounter: Payer: Self-pay | Admitting: Nurse Practitioner

## 2023-11-23 DIAGNOSIS — F02C0 Dementia in other diseases classified elsewhere, severe, without behavioral disturbance, psychotic disturbance, mood disturbance, and anxiety: Secondary | ICD-10-CM

## 2023-11-23 DIAGNOSIS — Z86718 Personal history of other venous thrombosis and embolism: Secondary | ICD-10-CM | POA: Diagnosis not present

## 2023-11-23 DIAGNOSIS — M159 Polyosteoarthritis, unspecified: Secondary | ICD-10-CM

## 2023-11-23 DIAGNOSIS — I10 Essential (primary) hypertension: Secondary | ICD-10-CM

## 2023-11-23 DIAGNOSIS — K5901 Slow transit constipation: Secondary | ICD-10-CM | POA: Diagnosis not present

## 2023-11-23 DIAGNOSIS — G301 Alzheimer's disease with late onset: Secondary | ICD-10-CM

## 2023-11-23 DIAGNOSIS — F339 Major depressive disorder, recurrent, unspecified: Secondary | ICD-10-CM

## 2023-11-23 NOTE — Progress Notes (Signed)
 Location:  Other [facility] [place].  Nursing Home Room Number: [facility] [id number] Place of Service:  SNF (31) Harlene Caro PIETY  PCP: Laurence Locus, DO  Patient Care Team: Laurence Locus, DO as PCP - General (Internal Medicine)  Extended Emergency Contact Information Primary Emergency Contact: Dlouhy,Jeff  [place] Mobile Phone: [phone] Relation: Son Secondary Emergency Contact: Weaver,Mike  [place] Mobile Phone: [phone] Relation: Son  Goals of care: Advanced Directive information    11/23/2023   10:37 AM  Advanced Directives  Does Patient Have a Medical Advance Directive? Yes  Type of Estate Agent of [place];Out of facility DNR (pink MOST or yellow form)  Does patient want to make changes to medical advance directive? No - Patient declined  Copy of Healthcare Power of Attorney in Chart? Yes - validated most recent copy scanned in chart (See row information)     Chief Complaint  Patient presents with   Medical Management of Chronic Issues    Medical Management of Chronic Issues.     HPI:  Pt is a 83 y.o. male seen today for medical management of chronic disease. Pt with advanced dementia, hx of htn, DVT, constipation and others.  His dementia is advanced requiring total care. Minimal conversation. No signs of pain.  He eats well with feds from staff Sleeps most the time.  No wounds noted.   Past Medical History:  Diagnosis Date   Acute cholecystitis    Acute metabolic encephalopathy    Alzheimer disease (HCC)    Anxiety    Aortic atherosclerosis    Arthritis    Chronic kidney disease    L TOTAL NEPHRECTOMY   History of DVT (deep vein thrombosis)    Hypertension    Sepsis [facility])    Past Surgical History:  Procedure Laterality Date   FRACTURE SURGERY     FRACTURED HIP SURGERY 1960   IR EXCHANGE BILIARY DRAIN  06/10/2021   IR FLUORO PROCEDURE UNLISTED  07/13/2021   IR PERC CHOLECYSTOSTOMY  04/17/2021    NEPHRECTOMY  1985   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   TOTAL HIP ARTHROPLASTY Left 08/20/2013   Procedure: LEFT TOTAL HIP ARTHROPLASTY ANTERIOR APPROACH;  Surgeon: Donnice JONETTA Car, MD;  [facility]: [facility];  Service: Orthopedics;  Laterality: Left;    Allergies  Allergen Reactions   Donepezil  Other (See Comments)    Increased confusion Still listed on pt's current med list as of 06/14/2021   Sulfites     Unable to recall reaction or specific sulfa drug    Outpatient Encounter Medications as of 11/23/2023  Medication Sig   acetaminophen  (TYLENOL ) 500 MG tablet Take 1,000 mg by mouth 3 (three) times daily.   amLODipine  (NORVASC ) 5 MG tablet Take 5 mg by mouth daily.   apixaban  (ELIQUIS ) 5 MG TABS tablet Take 5 mg by mouth 2 (two) times daily.   Multiple Vitamin (THERA-TABS PO) Take 1 tablet by mouth daily at 6 (six) AM.   senna-docusate (SENNA-S) 8.6-50 MG tablet Take 1 tablet by mouth 2 (two) times daily.   Zinc Oxide (TRIPLE PASTE) 12.8 % ointment Apply 1 Application topically. Every shift.   No facility-administered encounter medications on file as of 11/23/2023.    Review of Systems  Unable to perform ROS: Dementia     Immunization History  Administered Date(s) Administered   INFLUENZA, HIGH DOSE SEASONAL PF 11/23/2022   Influenza, Seasonal, Injecte, Preservative Fre 11/01/2014, 11/26/2015   Influenza,inj,Quad PF,6+ Mos 11/14/2016  Influenza-Unspecified 11/16/2020, 11/17/2021, 11/14/2023   Moderna Covid-19 Fall Seasonal Vaccine 41yrs & older 05/10/2022   Moderna Covid-19 Vaccine Bivalent Booster 80yrs & up 10/22/2020   Moderna SARS-COV2 Booster Vaccination 06/19/2020   Moderna Sars-Covid-2 Vaccination 02/10/2019, 03/10/2019, 12/11/2019   PFIZER(Purple Top)SARS-COV-2 Vaccination 12/10/2021   Pneumococcal Conjugate-13 01/02/2014   Pneumococcal Polysaccharide-23 09/02/2003   Tdap 06/08/2011   Unspecified SARS-COV-2 Vaccination 12/11/2019, 05/12/2023   Zoster  Recombinant(Shingrix) 06/09/2022   Pertinent  Health Maintenance Due  Topic Date Due   Influenza Vaccine  Completed      12/20/2021    9:24 PM 12/21/2021    7:30 AM 12/21/2021    8:00 PM 12/22/2021    9:44 AM 06/09/2022   12:45 PM  Fall Risk  Falls in the past year?     Exclusion - non ambulatory  (RETIRED) Patient Fall Risk Level High fall risk  High fall risk  High fall risk  High fall risk       Data saved with a previous flowsheet row definition   Functional Status Survey:    Vitals:   11/23/23 0936  BP: 116/76  Pulse: 80  Resp: 17  Temp: 98 F (36.7 C)  SpO2: 98%  Weight: 176 lb 6.4 oz (80 kg)  Height: 5' 7 (1.702 m)   Body mass index is 27.63 kg/m. Physical Exam Constitutional:      General: He is not in acute distress.    Appearance: He is well-developed. He is not diaphoretic.  HENT:     Head: Normocephalic and atraumatic.     Mouth/Throat:     Pharynx: No oropharyngeal exudate.  Eyes:     Conjunctiva/sclera: Conjunctivae normal.     Pupils: Pupils are equal, round, and reactive to light.  Cardiovascular:     Rate and Rhythm: Normal rate and regular rhythm.     Heart sounds: Normal heart sounds.  Pulmonary:     Effort: Pulmonary effort is normal.     Breath sounds: Normal breath sounds.  Abdominal:     General: Bowel sounds are normal.     Palpations: Abdomen is soft.  Musculoskeletal:        General: No tenderness.     Cervical back: Normal range of motion and neck supple.  Skin:    General: Skin is warm and dry.  Neurological:     Mental Status: He is alert. Mental status is at baseline. He is disoriented.     Motor: Weakness present.     Gait: Gait abnormal.     Labs reviewed: Recent Labs    12/22/22 0000 06/01/23 0000  NA 138 142  K 3.9 3.9  CL 103 104  CO2 25* 28*  BUN 17 19  CREATININE 1.1 1.1  CALCIUM 9.4 9.5   Recent Labs    12/22/22 0000 06/01/23 0000  AST 24 16  ALT 24 12  ALKPHOS 109 96  ALBUMIN 4.2 4.2    Recent Labs    12/22/22 0000 06/01/23 0000  WBC 9.0 9.4  NEUTROABS 5,733.00 6,025.00  HGB 15.1 14.6  HCT 45 45  PLT 305 342   No results found for: TSH No results found for: HGBA1C No results found for: CHOL, HDL, LDLCALC, LDLDIRECT, TRIG, CHOLHDL  Significant Diagnostic Results in last 30 days:  No results found.  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  Continue supportive care  Recurrent major depressive disorder Stable at this  time, not currently on medication   History of DVT (deep vein thrombosis) Stable, continues on eliquis  BID No signs of recurrent DVT on medication   Generalized osteoarthritis Controlled with Tylenol  scheduled No signs of worsening pain  Essential (primary) hypertension Blood pressure well controlled on norvasc  Continue current medications  follow metabolic panel   Constipation Well controlled on senna-s      Jovita Persing K. Caro BODILY [facility] [phone]

## 2023-11-27 DIAGNOSIS — F339 Major depressive disorder, recurrent, unspecified: Secondary | ICD-10-CM | POA: Insufficient documentation

## 2023-11-27 NOTE — Assessment & Plan Note (Signed)
 Controlled with Tylenol  scheduled No signs of worsening pain

## 2023-11-27 NOTE — Assessment & Plan Note (Signed)
 Advanced disease, total care at facility.  Continue supportive care

## 2023-11-27 NOTE — Assessment & Plan Note (Signed)
 Blood pressure well controlled on norvasc  Continue current medications  follow metabolic panel

## 2023-11-27 NOTE — Assessment & Plan Note (Signed)
 Stable at this time, not currently on medication

## 2023-11-27 NOTE — Assessment & Plan Note (Signed)
 Well controlled on senna-s

## 2023-11-27 NOTE — Assessment & Plan Note (Signed)
 Stable, continues on eliquis  BID No signs of recurrent DVT on medication

## 2023-12-04 LAB — HEPATIC FUNCTION PANEL
ALT: 29 U/L (ref 10–40)
AST: 22 (ref 14–40)
Alkaline Phosphatase: 80 (ref 25–125)
Bilirubin, Total: 0.4

## 2023-12-04 LAB — BASIC METABOLIC PANEL WITH GFR
BUN: 16 (ref 4–21)
CO2: 27 — AB (ref 13–22)
Chloride: 107 (ref 99–108)
Creatinine: 1 (ref 0.6–1.3)
Glucose: 112
Potassium: 3.6 meq/L (ref 3.5–5.1)
Sodium: 142 (ref 137–147)

## 2023-12-04 LAB — COMPREHENSIVE METABOLIC PANEL WITH GFR
Albumin: 3.7 (ref 3.5–5.0)
Calcium: 9 (ref 8.7–10.7)
Globulin: 2.3
eGFR: 72

## 2023-12-04 LAB — CBC AND DIFFERENTIAL
HCT: 44 (ref 41–53)
Hemoglobin: 14.5 (ref 13.5–17.5)
Neutrophils Absolute: 5184
Platelets: 330 K/uL (ref 150–400)
WBC: 9

## 2023-12-04 LAB — CBC: RBC: 5.06 (ref 3.87–5.11)

## 2023-12-20 ENCOUNTER — Encounter: Payer: Self-pay | Admitting: Internal Medicine

## 2023-12-20 ENCOUNTER — Non-Acute Institutional Stay (SKILLED_NURSING_FACILITY): Payer: Self-pay | Admitting: Orthopedic Surgery

## 2023-12-20 ENCOUNTER — Encounter: Payer: Self-pay | Admitting: Orthopedic Surgery

## 2023-12-20 DIAGNOSIS — G301 Alzheimer's disease with late onset: Secondary | ICD-10-CM

## 2023-12-20 DIAGNOSIS — I10 Essential (primary) hypertension: Secondary | ICD-10-CM | POA: Diagnosis not present

## 2023-12-20 DIAGNOSIS — F02C0 Dementia in other diseases classified elsewhere, severe, without behavioral disturbance, psychotic disturbance, mood disturbance, and anxiety: Secondary | ICD-10-CM

## 2023-12-20 DIAGNOSIS — K5901 Slow transit constipation: Secondary | ICD-10-CM

## 2023-12-20 DIAGNOSIS — M159 Polyosteoarthritis, unspecified: Secondary | ICD-10-CM

## 2023-12-20 DIAGNOSIS — F339 Major depressive disorder, recurrent, unspecified: Secondary | ICD-10-CM

## 2023-12-20 DIAGNOSIS — Z86718 Personal history of other venous thrombosis and embolism: Secondary | ICD-10-CM | POA: Diagnosis not present

## 2023-12-20 NOTE — Progress Notes (Signed)
 error    This encounter was created in error - please disregard.

## 2023-12-20 NOTE — Progress Notes (Signed)
 Location:  Other [facility] Room Number: [facility] [address]-A Place of Service:  SNF [phone]) Provider:  Greig Cluster, NP  Laurence Locus, DO  Patient Care Team: Laurence Locus, DO as PCP - General (Internal Medicine)  Extended Emergency Contact Information Primary Emergency Contact: Patchin,Jeff  [place] Mobile Phone: [phone] Relation: Son Secondary Emergency Contact: Deeley,Mike  [place] Phone: [phone] Relation: Son  Code Status:  DNR Goals of care: Advanced Directive information    12/20/2023    1:55 PM  Advanced Directives  Does Patient Have a Medical Advance Directive? Yes  Type of Advance Directive Out of facility DNR (pink MOST or yellow form)  Does patient want to make changes to medical advance directive? No - Patient declined  Copy of Healthcare Power of Attorney in Chart? Yes - validated most recent copy scanned in chart (See row information)     Chief Complaint  Patient presents with   Medical Management of Chronic Issues    HPI:  Pt is a 83 y.o. male seen today for medical management of chronic diseases.    He currently resides on the skilled nursing unit at [facility]. PMH: HTN, dementia, h/o DVT, OA, s/p LATH, protein deficiency, constipation, anxiety and depression.   HTN- BUN/creat 16/1.0 12/04/2023, see trends below, remains on amlodipine  AD- recent BIMS score 0/15 09/27, CT head noted atrophy with chronic small vessel ischemic changes 12/2021, no behaviors, dependent with ADLs including feeding, non ambulatory, see weights below DVT- diagnosed 07/2022, remains on Eliquis  OA- h/o LATH, remains on tylenol   Depression- no changes in mood, not on medication Constipation- LBM 10/19, remains on senna  Recent weights:  11/05- 175.4 lbs  10/02- 176.4 lbs  09/03- 179.2 lbs   Recent blood pressures:  11/12- 135/79  11/05- 105/71  10/29- 116/79    Past Medical History:  Diagnosis Date   Acute  cholecystitis    Acute metabolic encephalopathy    Alzheimer disease (HCC)    Anxiety    Aortic atherosclerosis    Arthritis    Chronic kidney disease    L TOTAL NEPHRECTOMY   History of DVT (deep vein thrombosis)    Hypertension    Sepsis (HCC)    Past Surgical History:  Procedure Laterality Date   FRACTURE SURGERY     FRACTURED HIP SURGERY 1960   IR EXCHANGE BILIARY DRAIN  06/10/2021   IR FLUORO PROCEDURE UNLISTED  07/13/2021   IR PERC CHOLECYSTOSTOMY  04/17/2021   NEPHRECTOMY  1985   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   TOTAL HIP ARTHROPLASTY Left 08/20/2013   Procedure: LEFT TOTAL HIP ARTHROPLASTY ANTERIOR APPROACH;  Surgeon: Donnice JONETTA Car, MD;  Location: [facility];  Service: Orthopedics;  Laterality: Left;    Allergies  Allergen Reactions   Donepezil  Other (See Comments)    Increased confusion Still listed on pt's current med list as of 06/14/2021   Sulfites     Unable to recall reaction or specific sulfa drug    Outpatient Encounter Medications as of 12/20/2023  Medication Sig   acetaminophen  (TYLENOL ) 500 MG tablet Take 1,000 mg by mouth 3 (three) times daily.   amLODipine  (NORVASC ) 5 MG tablet Take 5 mg by mouth daily.   apixaban  (ELIQUIS ) 5 MG TABS tablet Take 5 mg by mouth 2 (two) times daily.   Multiple Vitamin (THERA-TABS PO) Take 1 tablet by mouth daily at 6 (six) AM.   senna-docusate (SENNA-S) 8.6-50 MG tablet Take 1 tablet  by mouth 2 (two) times daily.   Zinc Oxide (TRIPLE PASTE) 12.8 % ointment Apply 1 Application topically. Every shift.   No facility-administered encounter medications on file as of 12/20/2023.    Review of Systems  Unable to perform ROS: Dementia    Immunization History  Administered Date(s) Administered   INFLUENZA, HIGH DOSE SEASONAL PF 11/23/2022   Influenza, Seasonal, Injecte, Preservative Fre 11/01/2014, 11/26/2015   Influenza,inj,Quad PF,6+ Mos 11/14/2016   Influenza-Unspecified 11/16/2020, 11/17/2021, 11/14/2023   Moderna  Covid-19 Fall Seasonal Vaccine 60yrs & older 05/10/2022   Moderna Covid-19 Vaccine Bivalent Booster 54yrs & up 10/22/2020   Moderna SARS-COV2 Booster Vaccination 06/19/2020   Moderna Sars-Covid-2 Vaccination 02/10/2019, 03/10/2019, 12/11/2019   PFIZER(Purple Top)SARS-COV-2 Vaccination 12/10/2021   Pneumococcal Conjugate-13 01/02/2014   Pneumococcal Polysaccharide-23 09/02/2003   Tdap 06/08/2011   Unspecified SARS-COV-2 Vaccination 12/11/2019, 05/12/2023, 11/24/2023   Zoster Recombinant(Shingrix) 06/09/2022   Pertinent  Health Maintenance Due  Topic Date Due   Influenza Vaccine  Completed      12/20/2021    9:24 PM 12/21/2021    7:30 AM 12/21/2021    8:00 PM 12/22/2021    9:44 AM 06/09/2022   12:45 PM  Fall Risk  Falls in the past year?     Exclusion - non ambulatory  (RETIRED) Patient Fall Risk Level High fall risk  High fall risk  High fall risk  High fall risk       Data saved with a previous flowsheet row definition   Functional Status Survey:    Vitals:   12/20/23 1354  BP: 135/79  Pulse: 87  Resp: 19  Temp: (!) 97.5 F (36.4 C)  SpO2: 95%  Weight: 175 lb 6.4 oz (79.6 kg)  Height: 5' 7 (1.702 m)   Body mass index is 27.47 kg/m. Physical Exam Vitals reviewed.  Constitutional:      General: He is not in acute distress. HENT:     Head: Normocephalic.     Right Ear: There is no impacted cerumen.     Left Ear: There is no impacted cerumen.     Nose: Nose normal.     Mouth/Throat:     Mouth: Mucous membranes are moist.  Eyes:     General:        Right eye: No discharge.        Left eye: No discharge.  Cardiovascular:     Rate and Rhythm: Normal rate and regular rhythm.     Pulses: Normal pulses.     Heart sounds: Normal heart sounds.  Pulmonary:     Effort: Pulmonary effort is normal.     Breath sounds: Normal breath sounds.  Abdominal:     General: Bowel sounds are normal. There is no distension.     Palpations: Abdomen is soft.     Tenderness: There  is no abdominal tenderness.  Musculoskeletal:     Cervical back: Neck supple.     Right lower leg: No edema.     Left lower leg: No edema.  Skin:    General: Skin is warm.     Capillary Refill: Capillary refill takes less than 2 seconds.  Neurological:     General: No focal deficit present.     Mental Status: He is alert. Mental status is at baseline.     Motor: Weakness present.     Gait: Gait abnormal.  Psychiatric:        Mood and Affect: Mood normal.     Labs reviewed:  Recent Labs    12/22/22 0000 06/01/23 0000 12/04/23 0000  NA 138 142 142  K 3.9 3.9 3.6  CL 103 104 107  CO2 25* 28* 27*  BUN 17 19 16   CREATININE 1.1 1.1 1.0  CALCIUM 9.4 9.5 9.0   Recent Labs    12/22/22 0000 06/01/23 0000 12/04/23 0000  AST 24 16 22   ALT 24 12 29   ALKPHOS 109 96 80  ALBUMIN 4.2 4.2 3.7   Recent Labs    12/22/22 0000 06/01/23 0000 12/04/23 0000  WBC 9.0 9.4 9.0  NEUTROABS 5,733.00 6,025.00 5,184.00  HGB 15.1 14.6 14.5  HCT 45 45 44  PLT 305 342 330   No results found for: TSH No results found for: HGBA1C No results found for: CHOL, HDL, LDLCALC, LDLDIRECT, TRIG, CHOLHDL  Significant Diagnostic Results in last 30 days:  No results found.  Assessment/Plan: 1. Essential (primary) hypertension (Primary) - controlled with amlodipine   2. 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 History of DVT (deep vein thrombosis) - diagnosed 07/2022 - cont Eliquis   4. Generalized osteoarthritis - h/o LATH - cont scheduled tylenol    5. Recurrent depression - no changes in mood  - not on medication  6. Slow transit constipation - abdomen soft - cont senna    Family/ staff Communication: plan discussed with patient and nurse  Labs/tests  ordered:  none

## 2023-12-21 ENCOUNTER — Non-Acute Institutional Stay (SKILLED_NURSING_FACILITY): Payer: Self-pay | Admitting: Nurse Practitioner

## 2023-12-21 ENCOUNTER — Encounter: Payer: Self-pay | Admitting: Nurse Practitioner

## 2023-12-21 DIAGNOSIS — Z0000 Encounter for general adult medical examination without abnormal findings: Secondary | ICD-10-CM

## 2023-12-21 NOTE — Patient Instructions (Signed)
 Robert Le,  Thank you for taking the time for your Medicare Wellness Visit. I appreciate your continued commitment to your health goals. Please review the care plan we discussed, and feel free to reach out if I can assist you further.  Please note that Annual Wellness Visits do not include a physical exam. Some assessments may be limited, especially if the visit was conducted virtually. If needed, we may recommend an in-person follow-up with your provider.  Ongoing Care Seeing your primary care provider every 3 to 6 months helps us  monitor your health and provide consistent, personalized care.   Referrals If a referral was made during today's visit and you haven't received any updates within two weeks, please contact the referred provider directly to check on the status.  Recommended Screenings:  Health Maintenance  Topic Date Due   Zoster (Shingles) Vaccine (2 of 2) 08/04/2022   DTaP/Tdap/Td vaccine (2 - Td or Tdap) 04/11/2024*   COVID-19 Vaccine (11 - 2025-26 season) 05/24/2024   Pneumococcal Vaccine for age over 23  Completed   Flu Shot  Completed   Meningitis B Vaccine  Aged Out  *Topic was postponed. The date shown is not the original due date.       12/21/2023   11:57 AM  Advanced Directives  Does Patient Have a Medical Advance Directive? Yes  Type of Advance Directive Out of facility DNR (pink MOST or yellow form)  Copy of Healthcare Power of Attorney in Chart? Yes - validated most recent copy scanned in chart (See row information)    Vision: Annual vision screenings are recommended for early detection of glaucoma, cataracts, and diabetic retinopathy. These exams can also reveal signs of chronic conditions such as diabetes and high blood pressure.  Dental: Annual dental screenings help detect early signs of oral cancer, gum disease, and other conditions linked to overall health, including heart disease and diabetes.

## 2023-12-21 NOTE — Progress Notes (Signed)
 Chief Complaint  Patient presents with   Medicare Wellness    AWV     Subjective:   Robert Le is a 83 y.o. male who presents for a Medicare Annual Wellness Visit.  Allergies (verified) Donepezil  and Sulfites   History: Past Medical History:  Diagnosis Date   Acute cholecystitis    Acute metabolic encephalopathy    Alzheimer disease (HCC)    Anxiety    Aortic atherosclerosis    Arthritis    Chronic kidney disease    L TOTAL NEPHRECTOMY   History of DVT (deep vein thrombosis)    Hypertension    Sepsis [facility])    Past Surgical History:  Procedure Laterality Date   FRACTURE SURGERY     FRACTURED HIP SURGERY 1960   IR EXCHANGE BILIARY DRAIN  06/10/2021   IR FLUORO PROCEDURE UNLISTED  07/13/2021   IR PERC CHOLECYSTOSTOMY  04/17/2021   NEPHRECTOMY  1985   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   TOTAL HIP ARTHROPLASTY Left 08/20/2013   Procedure: LEFT TOTAL HIP ARTHROPLASTY ANTERIOR APPROACH;  Surgeon: Donnice JONETTA Car, MD;  Location: [facility];  Service: Orthopedics;  Laterality: Left;   Family History  Problem Relation Age of Onset   Heart disease Father    Arthritis Brother    Arthritis Brother    Cancer Neg Hx    Diabetes Neg Hx    Social History   Occupational History   Occupation: Horticulturist, commercial    Comment: Retired  Tobacco Use   Smoking status: Never   Smokeless tobacco: Never  Vaping Use   Vaping status: Never Used  Substance and Sexual Activity   Alcohol  use: No   Drug use: No   Sexual activity: Not Currently   Tobacco Counseling Counseling given: Not Answered  SDOH Screenings   Food Insecurity: No Food Insecurity (07/02/2022)  Housing: Low Risk  (07/02/2022)  Transportation Needs: No Transportation Needs (07/02/2022)  Utilities: Not At Risk (07/02/2022)  Tobacco Use: Low Risk  (12/21/2023)   See flowsheets for full screening details  Depression Screen Depression Screening Exception Documentation Depression Screening Exception:: Medical reason  (dementia)     Goals Addressed   None    Visit info / Clinical Intake: Medicare Wellness Visit Type:: Subsequent Annual Wellness Visit Persons participating in visit:: caregiver (with patient present) Medicare Wellness Visit Mode:: In-person (required for WTM) Information given by:: caregiver Interpreter Needed?: No Living arrangements:: in nursing facility Patient's Overall Health Status Rating: (!) fair Typical amount of pain: none Does pain affect daily life?: no Are you currently prescribed opioids?: no  Dietary Habits and Nutritional Risks How many meals a day?: 3 Eats fruit and vegetables daily?: yes Most meals are obtained by: having others provide food In the last 2 weeks, have you had any of the following?: none  Functional Status Activities of Daily Living (to include ambulation/medication): (!) Dependent Feeding: Dependent Dressing/Grooming: Dependent Bathing: Dependent Toileting: Dependent Transfer: Dependent Ambulation: Dependent Medication Administration: Dependent Is this a change from baseline?: Pre-admission baseline Home Management: Dependent Manage your own finances?: (!) no Primary transportation is: facility / other Concerns about vision?: no *vision screening is required for WTM* Concerns about hearing?: no  Fall Screening Falls in the past year?: 0 Number of falls in past year: 0 Was there an injury with Fall?: 0 Fall Risk Category Calculator: 0 Patient Fall Risk Level: Low Fall Risk  Fall Risk Patient at Risk for Falls Due to: History of fall(s); Impaired balance/gait; Impaired  mobility Fall risk Follow up: Falls evaluation completed  Home and Transportation Safety: All rugs have non-skid backing?: N/A, no rugs All stairs or steps have railings?: N/A, no stairs Grab bars in the bathtub or shower?: yes Have non-skid surface in bathtub or shower?: yes Good home lighting?: yes Regular seat belt use?: yes Hospital stays in the last year::  no  Cognitive Assessment Difficulty concentrating, remembering, or making decisions? : yes Will 6CIT or Mini Cog be Completed: no 6CIT or Mini Cog Declined: patient has a diagnosis of dementia or cognitive impairment  Advance Directives (For Healthcare) Does Patient Have a Medical Advance Directive?: Yes Does patient want to make changes to medical advance directive?: No - Patient declined Type of Advance Directive: Out of facility DNR (pink MOST or yellow form) Copy of Healthcare Power of Attorney in Chart?: Yes - validated most recent copy scanned in chart (See row information) Out of facility DNR (pink MOST or yellow form) in Chart? (Ambulatory ONLY): Yes - validated most recent copy scanned in chart  Reviewed/Updated  Reviewed/Updated: Reviewed All (Medical, Surgical, Family, Medications, Allergies, Care Teams, Patient Goals)        Objective:    Today's Vitals   12/21/23 1015  BP: (!) 128/92  Pulse: 80  Resp: 18  Temp: 97.7 F (36.5 C)  SpO2: 95%  Weight: 175 lb 6.4 oz (79.6 kg)  Height: 5' 7 (1.702 m)   Body mass index is 27.47 kg/m.  Current Medications (verified) Outpatient Encounter Medications as of 12/21/2023  Medication Sig   acetaminophen  (TYLENOL ) 500 MG tablet Take 1,000 mg by mouth 3 (three) times daily.   amLODipine  (NORVASC ) 5 MG tablet Take 5 mg by mouth daily.   apixaban  (ELIQUIS ) 5 MG TABS tablet Take 5 mg by mouth 2 (two) times daily.   Multiple Vitamin (THERA-TABS PO) Take 1 tablet by mouth daily at 6 (six) AM.   senna-docusate (SENNA-S) 8.6-50 MG tablet Take 1 tablet by mouth 2 (two) times daily.   Zinc Oxide (TRIPLE PASTE) 12.8 % ointment Apply 1 Application topically. Every shift.   No facility-administered encounter medications on file as of 12/21/2023.   Hearing/Vision screen No results found. Immunizations and Health Maintenance Health Maintenance  Topic Date Due   Zoster Vaccines- Shingrix (2 of 2) 08/04/2022   DTaP/Tdap/Td (2 - Td  or Tdap) 04/11/2024 (Originally 06/07/2021)   COVID-19 Vaccine (11 - 2025-26 season) 05/24/2024   Pneumococcal Vaccine: 50+ Years  Completed   Influenza Vaccine  Completed   Meningococcal B Vaccine  Aged Out        Assessment/Plan:  This is a routine wellness examination for Trevyn.  Patient Care Team: Laurence Locus, DO as PCP - General (Internal Medicine)  I have personally reviewed and noted the following in the patient's chart:   Medical and social history Use of alcohol , tobacco or illicit drugs  Current medications and supplements including opioid prescriptions. Functional ability and status Nutritional status Physical activity Advanced directives List of other physicians Hospitalizations, surgeries, and ER visits in previous 12 months Vitals Screenings to include cognitive, depression, and falls Referrals and appointments  No orders of the defined types were placed in this encounter.  In addition, I have reviewed and discussed with patient certain preventive protocols, quality metrics, and best practice recommendations. A written personalized care plan for preventive services as well as general preventive health recommendations were provided to patient.   Harlene MARLA An, NP   12/21/2023

## 2024-01-04 ENCOUNTER — Encounter: Payer: Self-pay | Admitting: Nurse Practitioner

## 2024-01-04 ENCOUNTER — Non-Acute Institutional Stay (SKILLED_NURSING_FACILITY): Payer: Self-pay | Admitting: Nurse Practitioner

## 2024-01-04 DIAGNOSIS — Z86718 Personal history of other venous thrombosis and embolism: Secondary | ICD-10-CM

## 2024-01-04 DIAGNOSIS — K5900 Constipation, unspecified: Secondary | ICD-10-CM

## 2024-01-04 DIAGNOSIS — F02C0 Dementia in other diseases classified elsewhere, severe, without behavioral disturbance, psychotic disturbance, mood disturbance, and anxiety: Secondary | ICD-10-CM

## 2024-01-04 DIAGNOSIS — I10 Essential (primary) hypertension: Secondary | ICD-10-CM | POA: Diagnosis not present

## 2024-01-04 DIAGNOSIS — M159 Polyosteoarthritis, unspecified: Secondary | ICD-10-CM

## 2024-01-04 DIAGNOSIS — G301 Alzheimer's disease with late onset: Secondary | ICD-10-CM

## 2024-01-04 NOTE — Progress Notes (Signed)
 Location:  Other [facility] Room Number: [facility] [id number] Place of Service:  SNF [phone]) Robert Le  PCP: Laurence Locus, DO  Patient Care Team: Laurence Locus, DO as PCP - General (Internal Medicine)  Extended Emergency Contact Information Primary Emergency Contact: Totherow,Jeff  [place] Mobile Phone: [phone] Relation: Son Secondary Emergency Contact: Gilham,Mike  [place] Mobile Phone: [phone] Relation: Son  Goals of care: Advanced Directive information    12/21/2023   11:57 AM  Advanced Directives  Does Patient Have a Medical Advance Directive? Yes  Type of Advance Directive Out of facility DNR (pink MOST or yellow form)  Copy of Healthcare Power of Attorney in Chart? Yes - validated most recent copy scanned in chart (See row information)     Chief Complaint  Patient presents with   Family Concerns    Family Concerns    HPI:  Pt is a 83 y.o. male seen today for an acute visit for Family Concerns.  Son called- he reports he was getting regular phone calls from the nurse.  He [place] of [place] and comes to see him occasionally.  His weights have been stable and he is eating well No sign of pain.  No skin concern.    Past Medical History:  Diagnosis Date   Acute cholecystitis    Acute metabolic encephalopathy    Alzheimer disease (HCC)    Anxiety    Aortic atherosclerosis    Arthritis    Chronic kidney disease    L TOTAL NEPHRECTOMY   History of DVT (deep vein thrombosis)    Hypertension    Sepsis [facility])    Past Surgical History:  Procedure Laterality Date   FRACTURE SURGERY     FRACTURED HIP SURGERY 1960   IR EXCHANGE BILIARY DRAIN  06/10/2021   IR FLUORO PROCEDURE UNLISTED  07/13/2021   IR PERC CHOLECYSTOSTOMY  04/17/2021   NEPHRECTOMY  1985   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   TOTAL HIP ARTHROPLASTY Left 08/20/2013   Procedure: LEFT TOTAL HIP ARTHROPLASTY ANTERIOR APPROACH;  Surgeon:  Donnice JONETTA Car, MD;  Location: [facility];  Service: Orthopedics;  Laterality: Left;    Allergies  Allergen Reactions   Donepezil  Other (See Comments)    Increased confusion Still listed on pt's current med list as of 06/14/2021   Sulfites     Unable to recall reaction or specific sulfa drug    Outpatient Encounter Medications as of 01/04/2024  Medication Sig   acetaminophen  (TYLENOL ) 500 MG tablet Take 1,000 mg by mouth 3 (three) times daily.   amLODipine  (NORVASC ) 5 MG tablet Take 5 mg by mouth daily.   apixaban  (ELIQUIS ) 5 MG TABS tablet Take 5 mg by mouth 2 (two) times daily.   Multiple Vitamin (THERA-TABS PO) Take 1 tablet by mouth daily at 6 (six) AM.   senna-docusate (SENNA-S) 8.6-50 MG tablet Take 1 tablet by mouth 2 (two) times daily.   Zinc Oxide (TRIPLE PASTE) 12.8 % ointment Apply 1 Application topically. Every shift.   No facility-administered encounter medications on file as of 01/04/2024.    Review of Systems  Unable to perform ROS: Dementia    Immunization History  Administered Date(s) Administered   INFLUENZA, HIGH DOSE SEASONAL PF 11/23/2022   Influenza, Seasonal, Injecte, Preservative Fre 11/01/2014, 11/26/2015   Influenza,inj,Quad PF,6+ Mos 11/14/2016   Influenza-Unspecified 11/16/2020, 11/17/2021, 11/14/2023   Moderna Covid-19 Fall Seasonal Vaccine 68yrs & older 05/10/2022   Emmanuel Plenty  Vaccine Bivalent Booster 58yrs & up 10/22/2020   Moderna SARS-COV2 Booster Vaccination 06/19/2020   Moderna Sars-Covid-2 Vaccination 02/10/2019, 03/10/2019, 12/11/2019   PFIZER(Purple Top)SARS-COV-2 Vaccination 12/10/2021   Pneumococcal Conjugate-13 01/02/2014   Pneumococcal Polysaccharide-23 09/02/2003   Tdap 06/08/2011   Unspecified SARS-COV-2 Vaccination 12/11/2019, 05/12/2023, 11/24/2023   Zoster Recombinant(Shingrix) 06/09/2022   Pertinent  Health Maintenance Due  Topic Date Due   Influenza Vaccine  Completed      12/21/2021    8:00 PM 12/22/2021    9:44 AM  06/09/2022   12:45 PM 12/20/2023    3:45 PM 12/21/2023   11:57 AM  Fall Risk  Falls in the past year?   Exclusion - non ambulatory 0 0  Was there an injury with Fall?    0  0   Fall Risk Category Calculator    0 0  (RETIRED) Patient Fall Risk Level High fall risk  High fall risk      Patient at Risk for Falls Due to    History of fall(s);Impaired balance/gait;Impaired mobility   Fall risk Follow up    Falls evaluation completed      Data saved with a previous flowsheet row definition   Functional Status Survey:    Vitals:   01/04/24 0838  BP: 124/89  Pulse: 82  Resp: 17  Temp: 97.8 F (36.6 C)  SpO2: 97%  Weight: 175 lb 6.4 oz (79.6 kg)  Height: 5' 7 (1.702 m)   Body mass index is 27.47 kg/m. Wt Readings from Last 3 Encounters:  01/04/24 175 lb 6.4 oz (79.6 kg)  12/21/23 175 lb 6.4 oz (79.6 kg)  12/20/23 175 lb 6.4 oz (79.6 kg)    Physical Exam Constitutional:      Appearance: Normal appearance.  Pulmonary:     Effort: Pulmonary effort is normal.  Skin:    General: Skin is warm.  Neurological:     Mental Status: He is alert. Mental status is at baseline.  Psychiatric:        Mood and Affect: Mood normal.     Labs reviewed: Recent Labs    06/01/23 0000 12/04/23 0000  NA 142 142  K 3.9 3.6  CL 104 107  CO2 28* 27*  BUN 19 16  CREATININE 1.1 1.0  CALCIUM 9.5 9.0   Recent Labs    06/01/23 0000 12/04/23 0000  AST 16 22  ALT 12 29  ALKPHOS 96 80  ALBUMIN 4.2 3.7   Recent Labs    06/01/23 0000 12/04/23 0000  WBC 9.4 9.0  NEUTROABS 6,025.00 5,184.00  HGB 14.6 14.5  HCT 45 44  PLT 342 330   No results found for: TSH No results found for: HGBA1C No results found for: CHOL, HDL, LDLCALC, LDLDIRECT, TRIG, CHOLHDL  Significant Diagnostic Results in last 30 days:  No results found.  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  Continue supportive care  History of DVT (deep vein thrombosis) Stable, continues on eliquis  BID No signs of recurrent DVT on medication   Generalized osteoarthritis Controlled with Tylenol  scheduled No signs of worsening pain  Essential (primary) hypertension Blood pressure well controlled on norvasc  Continue current medications  follow metabolic panel   Constipation Well controlled on senna-s     Syndey Jaskolski K. Caro BODILY [facility] [phone]

## 2024-01-04 NOTE — Assessment & Plan Note (Signed)
 Blood pressure well controlled on norvasc  Continue current medications  follow metabolic panel

## 2024-01-04 NOTE — Assessment & Plan Note (Signed)
 Well controlled on senna-s

## 2024-01-04 NOTE — Assessment & Plan Note (Signed)
 Advanced disease, total care at facility.  Continue supportive care

## 2024-01-04 NOTE — Assessment & Plan Note (Signed)
 Stable, continues on eliquis  BID No signs of recurrent DVT on medication

## 2024-01-04 NOTE — Assessment & Plan Note (Signed)
 Controlled with Tylenol  scheduled No signs of worsening pain

## 2024-01-23 ENCOUNTER — Encounter: Payer: Self-pay | Admitting: Internal Medicine

## 2024-01-23 ENCOUNTER — Non-Acute Institutional Stay (SKILLED_NURSING_FACILITY): Payer: Self-pay | Admitting: Internal Medicine

## 2024-01-23 DIAGNOSIS — Z86718 Personal history of other venous thrombosis and embolism: Secondary | ICD-10-CM

## 2024-01-23 DIAGNOSIS — I10 Essential (primary) hypertension: Secondary | ICD-10-CM

## 2024-01-23 DIAGNOSIS — F03C0 Unspecified dementia, severe, without behavioral disturbance, psychotic disturbance, mood disturbance, and anxiety: Secondary | ICD-10-CM

## 2024-01-23 DIAGNOSIS — Z66 Do not resuscitate: Secondary | ICD-10-CM | POA: Diagnosis not present

## 2024-01-23 NOTE — Assessment & Plan Note (Signed)
 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

## 2024-01-23 NOTE — Progress Notes (Signed)
 [facility] Routine Visit Progress Note    Location:  Other [place].  Nursing Home Room Number: [facility] [id number] Place of Service:  SNF (31)   PCP: Laurence Locus, DO   Patient Care Team: Laurence Locus, DO as PCP - General (Internal Medicine)   Extended Emergency Contact Information Primary Emergency Contact: Kertesz,Jeff  [place] Mobile Phone: [phone] Relation: Son Secondary Emergency Contact: Welliver,Mike  [place] Mobile Phone: [phone] Relation: Son   Goals of care: Advanced Directive information    12/21/2023   11:57 AM  Advanced Directives  Does Patient Have a Medical Advance Directive? Yes  Type of Advance Directive Out of facility DNR (pink MOST or yellow form)  Copy of Healthcare Power of Attorney in Chart? Yes - validated most recent copy scanned in chart (See row information)    CODE STATUS: Do Not Resuscitate (DNR)   Chief Complaint  Patient presents with   Medical Management of Chronic Issues    Medical Management of Chronic Issues.      HPI: Pt is a 83 y.o. male seen today for medical management of chronic disease.   83 year old male with a history of severe dementia, hypertension, history of DVT, who is seen for routine medical care.  Patient is sitting in his recliner in the TV room.  Patient keeps his eyes closed during interview.  He is not oriented x 4.  Does answer to his name.  Nursing without any concerns.   Past Medical History:  Diagnosis Date   Acute cholecystitis    Acute metabolic encephalopathy    Alzheimer disease (HCC)    Anxiety    Aortic atherosclerosis    Arthritis    Chronic kidney disease    L TOTAL NEPHRECTOMY   History of DVT (deep vein thrombosis)    Hypertension    Sepsis [facility])    Past Surgical History:  Procedure Laterality Date   FRACTURE SURGERY     FRACTURED HIP SURGERY 1960   IR EXCHANGE BILIARY DRAIN  06/10/2021   IR FLUORO PROCEDURE UNLISTED  07/13/2021   IR  PERC CHOLECYSTOSTOMY  04/17/2021   NEPHRECTOMY  1985   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   TOTAL HIP ARTHROPLASTY Left 08/20/2013   Procedure: LEFT TOTAL HIP ARTHROPLASTY ANTERIOR APPROACH;  Surgeon: Donnice JONETTA Car, MD;  Location: [facility];  Service: Orthopedics;  Laterality: Left;     Allergies[1]   Outpatient Encounter Medications as of 01/23/2024  Medication Sig   acetaminophen  (TYLENOL ) 500 MG tablet Take 1,000 mg by mouth 3 (three) times daily.   amLODipine  (NORVASC ) 5 MG tablet Take 5 mg by mouth daily.   apixaban  (ELIQUIS ) 5 MG TABS tablet Take 5 mg by mouth 2 (two) times daily.   senna-docusate (SENNA-S) 8.6-50 MG tablet Take 1 tablet by mouth 2 (two) times daily.   Zinc Oxide (TRIPLE PASTE) 12.8 % ointment Apply 1 Application topically. Every shift.   Multiple Vitamin (THERA-TABS PO) Take 1 tablet by mouth daily at 6 (six) AM. (Patient not taking: Reported on 01/23/2024)   No facility-administered encounter medications on file as of 01/23/2024.     Review of Systems  Unable to perform ROS: Dementia      Immunization History  Administered Date(s) Administered   INFLUENZA, HIGH DOSE SEASONAL PF 11/23/2022   Influenza, Seasonal, Injecte, Preservative Fre 11/01/2014, 11/26/2015   Influenza,inj,Quad PF,6+ Mos 11/14/2016   Influenza-Unspecified 11/16/2020, 11/17/2021, 11/14/2023   Moderna Covid-19 Fall Seasonal Vaccine 29yrs &  older 05/10/2022   Moderna Covid-19 Vaccine Bivalent Booster 52yrs & up 10/22/2020   Moderna SARS-COV2 Booster Vaccination 06/19/2020   Moderna Sars-Covid-2 Vaccination 02/10/2019, 03/10/2019, 12/11/2019   PFIZER(Purple Top)SARS-COV-2 Vaccination 12/10/2021   Pneumococcal Conjugate-13 01/02/2014   Pneumococcal Polysaccharide-23 09/02/2003   Tdap 06/08/2011   Unspecified SARS-COV-2 Vaccination 12/11/2019, 05/12/2023, 11/24/2023   Zoster Recombinant(Shingrix) 06/09/2022   Pertinent  Health Maintenance Due  Topic Date Due   Influenza Vaccine  Completed       12/21/2021    8:00 PM 12/22/2021    9:44 AM 06/09/2022   12:45 PM 12/20/2023    3:45 PM 12/21/2023   11:57 AM  Fall Risk  Falls in the past year?   Exclusion - non ambulatory 0 0  Was there an injury with Fall?    0  0   Fall Risk Category Calculator    0 0  (RETIRED) Patient Fall Risk Level High fall risk  High fall risk      Patient at Risk for Falls Due to    History of fall(s);Impaired balance/gait;Impaired mobility   Fall risk Follow up    Falls evaluation completed      Data saved with a previous flowsheet row definition   Functional Status Survey:     Vitals:   01/23/24 0905  BP: 133/84  Pulse: 88  Resp: 17  Temp: (!) 97.5 F (36.4 C)  SpO2: 96%  Weight: 167 lb (75.8 kg)  Height: 5' 7 (1.702 m)   Body mass index is 26.16 kg/m. Physical Exam Vitals and nursing note reviewed.  Constitutional:      General: He is not in acute distress.    Appearance: He is not toxic-appearing.     Comments: Kept his eyes closed during examination and interview.  HENT:     Head: Normocephalic.  Eyes:     General: No scleral icterus. Cardiovascular:     Rate and Rhythm: Normal rate.  Pulmonary:     Effort: Pulmonary effort is normal.     Breath sounds: Normal breath sounds.  Abdominal:     General: Bowel sounds are normal.     Palpations: Abdomen is soft.  Musculoskeletal:     Comments: Wearing bilateral heel protector boots  Skin:    General: Skin is warm and dry.     Capillary Refill: Capillary refill takes less than 2 seconds.  Neurological:     Mental Status: He is disoriented.      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  Severe.  Not on any antidepressants, antipsychotics.  Essential (primary) hypertension Continue Norvasc  5 mg daily.  Blood pressure well-controlled.  History of DVT (deep vein thrombosis) Continue with Eliquis  5 mg twice daily for recurrent lower extremity DVTs.  DNR (do not resuscitate)/DNI(Do Not Intubate)     Camellia Door, DO  [facility] [phone]      [1]  Allergies Allergen Reactions   Donepezil  Other (See Comments)    Increased confusion Still listed on pt's current med list as of 06/14/2021   Sulfites     Unable to recall reaction or specific sulfa drug

## 2024-01-23 NOTE — Assessment & Plan Note (Signed)
 Robert Le

## 2024-01-23 NOTE — Assessment & Plan Note (Signed)
 Continue Norvasc  5 mg daily.  Blood pressure well-controlled.

## 2024-01-23 NOTE — Assessment & Plan Note (Signed)
 Chronic.  Severe.  Not on any antidepressants, antipsychotics.

## 2024-02-20 ENCOUNTER — Non-Acute Institutional Stay (SKILLED_NURSING_FACILITY): Payer: Self-pay | Admitting: Nurse Practitioner

## 2024-02-20 ENCOUNTER — Encounter: Payer: Self-pay | Admitting: Nurse Practitioner

## 2024-02-20 DIAGNOSIS — F339 Major depressive disorder, recurrent, unspecified: Secondary | ICD-10-CM

## 2024-02-20 DIAGNOSIS — F03C0 Unspecified dementia, severe, without behavioral disturbance, psychotic disturbance, mood disturbance, and anxiety: Secondary | ICD-10-CM

## 2024-02-20 DIAGNOSIS — Z86718 Personal history of other venous thrombosis and embolism: Secondary | ICD-10-CM | POA: Diagnosis not present

## 2024-02-20 DIAGNOSIS — M159 Polyosteoarthritis, unspecified: Secondary | ICD-10-CM | POA: Diagnosis not present

## 2024-02-20 DIAGNOSIS — I10 Essential (primary) hypertension: Secondary | ICD-10-CM

## 2024-02-20 DIAGNOSIS — K5900 Constipation, unspecified: Secondary | ICD-10-CM

## 2024-02-20 NOTE — Assessment & Plan Note (Signed)
 Controlled with Tylenol  scheduled No signs of worsening pain

## 2024-02-20 NOTE — Assessment & Plan Note (Signed)
 Well controlled on senna-s

## 2024-02-20 NOTE — Progress Notes (Signed)
 " Location:  Other [place].  Nursing Home Room Number: [facility] [id number] Place of Service:  SNF [phone]) Harlene An, NP  PCP: Laurence Locus, DO  Patient Care Team: Laurence Locus, DO as PCP - General (Internal Medicine)  Extended Emergency Contact Information Primary Emergency Contact: Rottinghaus,Jeff  [place] Mobile Phone: [phone] Relation: Son Secondary Emergency Contact: Nieland,Mike  [place] Mobile Phone: [phone] Relation: Son  Goals of care: Advanced Directive information    02/20/2024   12:26 PM  Advanced Directives  Does Patient Have a Medical Advance Directive? Yes  Type of Estate Agent of [place];Out of facility DNR (pink MOST or yellow form)  Does patient want to make changes to medical advance directive? No - Patient declined  Copy of Healthcare Power of Attorney in Chart? Yes - validated most recent copy scanned in chart (See row information)     Chief Complaint  Patient presents with   Medical Management of Chronic Issues    Medical Management of Chronic Issues.     HPI:  Pt is a 84 y.o. male seen today for medical management of chronic disease.  Pt is at [facility] for long term care.  He has advanced dementia and requires total care by staff.  He has been doing well in the last month without acute issues.  Staff feeds patient and he has been eating well. He has gained weight in the last month No new concerns from staff.  No signs of pain.    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   amLODipine  (NORVASC ) 5 MG tablet Take 5 mg by mouth daily.   apixaban  (ELIQUIS ) 5 MG TABS tablet Take 5 mg by mouth 2 (two) times daily.   senna-docusate (SENNA-S) 8.6-50 MG tablet Take 1 tablet by mouth 2 (two) times daily.   Zinc Oxide (TRIPLE PASTE) 12.8 % ointment Apply 1 Application topically. Every shift.   Multiple Vitamin (THERA-TABS PO) Take 1 tablet by mouth daily at 6 (six) AM. (Patient not taking: Reported on 02/20/2024)   No facility-administered encounter medications on file as of 02/20/2024.    Review of Systems  Unable to perform ROS: Dementia     Immunization History  Administered Date(s) Administered   INFLUENZA, HIGH DOSE SEASONAL PF 11/23/2022   Influenza, Seasonal, Injecte, Preservative Fre 11/01/2014, 11/26/2015   Influenza,inj,Quad PF,6+ Mos 11/14/2016   Influenza-Unspecified 11/16/2020, 11/17/2021, 11/14/2023   Moderna Covid-19 Fall Seasonal  Vaccine 59yrs & older 05/10/2022   Moderna Covid-19 Vaccine Bivalent Booster 41yrs & up 10/22/2020   Moderna SARS-COV2 Booster Vaccination 06/19/2020   Moderna Sars-Covid-2 Vaccination 02/10/2019, 03/10/2019, 12/11/2019   PFIZER(Purple Top)SARS-COV-2 Vaccination 12/10/2021   Pneumococcal Conjugate-13 01/02/2014   Pneumococcal Polysaccharide-23 09/02/2003   Tdap 06/08/2011   Unspecified SARS-COV-2 Vaccination 12/11/2019, 05/12/2023, 11/24/2023   Zoster Recombinant(Shingrix) 06/09/2022   Pertinent  Health Maintenance Due  Topic Date  Due   Influenza Vaccine  Completed      12/21/2021    8:00 PM 12/22/2021    9:44 AM 06/09/2022   12:45 PM 12/20/2023    3:45 PM 12/21/2023   11:57 AM  Fall Risk  Falls in the past year?   Exclusion - non ambulatory 0 0  Was there an injury with Fall?    0  0   Fall Risk Category Calculator    0 0  (RETIRED) Patient Fall Risk Level High fall risk  High fall risk      Patient at Risk for Falls Due to    History of fall(s);Impaired balance/gait;Impaired mobility   Fall risk Follow up    Falls evaluation completed      Data saved with a previous flowsheet row definition   Functional Status Survey:    Vitals:   02/20/24 1214 02/20/24 1237  BP: (!) 158/72 (!) 140/87  Pulse: 93   Resp: 16   Temp: 97.9 F (36.6 C)   SpO2: 97%   Weight: 170 lb 6.4 oz (77.3 kg)   Height: 5' 7 (1.702 m)    Body mass index is 26.69 kg/m. Wt Readings from Last 3 Encounters:  02/20/24 170 lb 6.4 oz (77.3 kg)  01/23/24 167 lb (75.8 kg)  01/04/24 175 lb 6.4 oz (79.6 kg)    Physical Exam Constitutional:      General: He is not in acute distress.    Appearance: He is well-developed. He is not diaphoretic.  HENT:     Head: Normocephalic and atraumatic.     Right Ear: External ear normal.     Left Ear: External ear normal.     Mouth/Throat:     Pharynx: No oropharyngeal exudate.  Eyes:     Conjunctiva/sclera: Conjunctivae normal.     Pupils: Pupils are equal, round, and reactive to light.  Cardiovascular:     Rate and Rhythm: Normal rate and regular rhythm.     Heart sounds: Normal heart sounds.  Pulmonary:     Effort: Pulmonary effort is normal.     Breath sounds: Normal breath sounds.  Abdominal:     General: Bowel sounds are normal.     Palpations: Abdomen is soft.  Musculoskeletal:        General: No tenderness.     Cervical back: Normal range of motion and neck supple.     Right lower leg: No edema.     Left lower leg: No edema.  Skin:    General: Skin is warm and dry.   Neurological:     Mental Status: He is alert. Mental status is at baseline.     Motor: Weakness present.     Gait: Gait abnormal.     Labs reviewed: Recent Labs    06/01/23 0000 12/04/23 0000  NA 142 142  K 3.9 3.6  CL 104 107  CO2 28* 27*  BUN 19 16  CREATININE 1.1 1.0  CALCIUM 9.5 9.0   Recent Labs    06/01/23 0000 12/04/23 0000  AST 16 22  ALT 12 29  ALKPHOS 96 80  ALBUMIN 4.2 3.7   Recent Labs    06/01/23 0000 12/04/23 0000  WBC 9.4 9.0  NEUTROABS 6,025.00 5,184.00  HGB 14.6 14.5  HCT 45 44  PLT 342 330   No results found for: TSH No results found for: HGBA1C No results found for: CHOL, HDL, LDLCALC, LDLDIRECT, TRIG, CHOLHDL  Significant Diagnostic Results in last 30 days:  No results found.  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  Generalized osteoarthritis Controlled with Tylenol  scheduled No signs of worsening pain  Essential (primary) hypertension Well controlled on norvasc  5 mg daily.  Constipation Well controlled on senna-s   Recurrent major depressive disorder Stable at this time, not currently on medication No signs of recurrent depression     Jasiya Markie K. Caro BODILY [facility] [phone]       [1]  Allergies Allergen Reactions   Donepezil  Other (See Comments)    Increased confusion Still listed on pt's current med list as of 06/14/2021   Sulfites     Unable to recall reaction or specific sulfa drug   "

## 2024-02-20 NOTE — Assessment & Plan Note (Signed)
Well controlled on norvasc 5 mg daily

## 2024-02-20 NOTE — Assessment & Plan Note (Signed)
 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

## 2024-02-20 NOTE — Assessment & Plan Note (Signed)
 Advanced dementia, continues total care from facility.

## 2024-02-20 NOTE — Assessment & Plan Note (Signed)
 Stable at this time, not currently on medication No signs of recurrent depression

## 2024-02-24 ENCOUNTER — Other Ambulatory Visit: Payer: Self-pay | Admitting: Orthopedic Surgery

## 2024-02-24 DIAGNOSIS — Z515 Encounter for palliative care: Secondary | ICD-10-CM

## 2024-02-24 MED ORDER — MORPHINE SULFATE (CONCENTRATE) 20 MG/ML PO SOLN: 5.0000 mg | ORAL | 0 refills | Status: AC | PRN

## 2024-02-24 MED ORDER — LORAZEPAM 0.5 MG PO TABS: 0.5000 mg | ORAL_TABLET | Freq: Four times a day (QID) | ORAL | 1 refills | Status: AC | PRN

## 2024-02-24 NOTE — Progress Notes (Signed)
 Patient lethargic per TL nursing. He had 2 episodes of vomiting earlier this morning. He also has not voided. HR> 140's. BP> 160/100's. Temp > 100. No future hospitalizations per goals of care. Orders given for morphine  and ativan  and proceed with comfort measures.

## 2024-02-27 ENCOUNTER — Other Ambulatory Visit: Payer: Self-pay | Admitting: Internal Medicine

## 2024-02-27 ENCOUNTER — Encounter: Payer: Self-pay | Admitting: Internal Medicine

## 2024-02-27 ENCOUNTER — Non-Acute Institutional Stay (SKILLED_NURSING_FACILITY): Payer: Self-pay | Admitting: Internal Medicine

## 2024-02-27 DIAGNOSIS — F02C0 Dementia in other diseases classified elsewhere, severe, without behavioral disturbance, psychotic disturbance, mood disturbance, and anxiety: Secondary | ICD-10-CM | POA: Diagnosis not present

## 2024-02-27 DIAGNOSIS — Z515 Encounter for palliative care: Secondary | ICD-10-CM

## 2024-02-27 DIAGNOSIS — G301 Alzheimer's disease with late onset: Secondary | ICD-10-CM | POA: Diagnosis not present

## 2024-02-27 DIAGNOSIS — Z66 Do not resuscitate: Secondary | ICD-10-CM | POA: Diagnosis not present

## 2024-02-27 NOTE — Assessment & Plan Note (Signed)
 Chronic. Pt is now at end of life.

## 2024-02-27 NOTE — Assessment & Plan Note (Signed)
 Spoke with pt's son Robert Le. Robert Le him update. Will consult hospice for end-of-life care. Prn morphine  and prn ativan  started. Will await hospice consult before stopping other non-essential medications.  Pt's son Robert Le states he will call his sister and give her update.

## 2024-02-27 NOTE — Assessment & Plan Note (Signed)
"   Pt is already DNR/DNI. Has MOST form completed with Comfort Measures checked off. "

## 2024-02-27 NOTE — Progress Notes (Signed)
 [facility] Acute Care Progress Note    Location:  Other [facility] Room Number: [facility] 420-A Place of Service:  SNF ([phone])   Laurence Locus, DO   Patient Care Team: Laurence Locus, DO as PCP - General (Internal Medicine)   Extended Emergency Contact Information Primary Emergency Contact: Confer,Jeff  [place] Mobile Phone: [phone] Relation: Son Secondary Emergency Contact: Vaccaro,Mike  [place] Phone: [phone] Relation: Son   Goals of care: Advanced Directive information    02/27/2024   11:01 AM  Advanced Directives  Does Patient Have a Medical Advance Directive? Yes  Type of Advance Directive Out of facility DNR (pink MOST or yellow form)  Does patient want to make changes to medical advance directive? No - Patient declined  Copy of Healthcare Power of Attorney in Chart? Yes - validated most recent copy scanned in chart (See row information)     CODE STATUS:Do Not Resuscitate (DNR)   Chief Complaint  Patient presents with   Acute Visit    End of life care     HPI: Pt is a 84 y.o. male seen today for an acute visit for end of life care.  84 year old male with a history of severe dementia, hypertension, history of DVT,  who is being seen due to worsening altered mental status over the weekend. Comfort care measures with morphine  and ativan  started.  Hospice consulted. I spoke to pt's son Lucero Ide. Son is agreeable with hospice consult.     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   amLODipine  (NORVASC ) 5 MG tablet Take 5 mg by mouth daily.   apixaban  (ELIQUIS ) 5 MG TABS tablet Take 5 mg by mouth 2 (two) times daily.   LORazepam  (ATIVAN ) 0.5 MG tablet Take 1 tablet (0.5 mg total) by mouth every 6 (six) hours as needed for anxiety.   morphine  (ROXANOL) 20 MG/ML concentrated solution Take 0.25 mLs (5 mg total) by mouth every 3 (three) hours as needed for severe pain (pain score 7-10), shortness of breath or anxiety.   senna-docusate (SENNA-S) 8.6-50 MG tablet Take 1 tablet by mouth 2 (two) times daily.   Zinc Oxide (TRIPLE PASTE) 12.8 % ointment Apply 1 Application topically. Every shift.   acetaminophen  (TYLENOL ) 500 MG tablet Take 1,000 mg by mouth 3 (three) times daily. (Patient not taking: Reported on 02/27/2024)   Multiple Vitamin (THERA-TABS PO) Take 1 tablet  by mouth daily at 6 (six) AM. (Patient not taking: Reported on 02/27/2024)   No facility-administered encounter medications on file as of 02/27/2024.     Review of Systems  Unable to perform ROS: Dementia     Immunization History  Administered Date(s) Administered   INFLUENZA, HIGH DOSE SEASONAL PF 11/23/2022   Influenza, Seasonal, Injecte, Preservative Fre 11/01/2014, 11/26/2015   Influenza,inj,Quad PF,6+ Mos 11/14/2016   Influenza-Unspecified 11/16/2020, 11/17/2021, 11/14/2023   Moderna Covid-19 Fall Seasonal Vaccine 2yrs & older 05/10/2022   Moderna Covid-19 Vaccine Bivalent Booster 81yrs & up 10/22/2020    Moderna SARS-COV2 Booster Vaccination 06/19/2020   Moderna Sars-Covid-2 Vaccination 02/10/2019, 03/10/2019, 12/11/2019   PFIZER(Purple Top)SARS-COV-2 Vaccination 12/10/2021   Pneumococcal Conjugate-13 01/02/2014   Pneumococcal Polysaccharide-23 09/02/2003   Tdap 06/08/2011   Unspecified SARS-COV-2 Vaccination 12/11/2019, 05/12/2023, 11/24/2023   Zoster Recombinant(Shingrix) 06/09/2022   Pertinent  Health Maintenance Due  Topic Date Due   Influenza Vaccine  Completed      12/21/2021    8:00 PM 12/22/2021    9:44 AM 06/09/2022   12:45 PM 12/20/2023    3:45 PM 12/21/2023   11:57 AM  Fall Risk  Falls in the past year?   Exclusion - non ambulatory 0 0  Was there an injury with Fall?    0  0   Fall Risk Category Calculator    0 0  (RETIRED) Patient Fall Risk Level High fall risk  High fall risk      Patient at Risk for Falls Due to    History of fall(s);Impaired balance/gait;Impaired mobility   Fall risk Follow up    Falls evaluation completed      Data saved with a previous flowsheet row definition   Functional Status Survey:     Vitals:   02/27/24 1052  BP: 132/66  Pulse: 92  Resp: 18  Temp: 97.6 F (36.4 C)  SpO2: 97%  Weight: 170 lb 6.4 oz (77.3 kg)   Body mass index is 26.69 kg/m. Physical Exam Vitals and nursing note reviewed.  Constitutional:      Comments: Eyes are open but he is non-verbal I spoke heel protector booties on his feet bilaterally  HENT:     Head: Normocephalic.  Cardiovascular:     Rate and Rhythm: Normal rate and regular rhythm.  Pulmonary:     Effort: Pulmonary effort is normal.     Breath sounds: Normal breath sounds.  Abdominal:     General: Abdomen is flat.  Musculoskeletal:     Right lower leg: No edema.     Left lower leg: No edema.  Skin:    Capillary Refill: Capillary refill takes less than 2 seconds.  Neurological:     Comments: Non-verbal      Labs reviewed: Recent Labs    06/01/23 0000 12/04/23 0000  NA 142 142  K  3.9 3.6  CL 104 107  CO2 28* 27*  BUN 19 16  CREATININE 1.1 1.0  CALCIUM 9.5 9.0   Recent Labs    06/01/23 0000 12/04/23 0000  AST 16 22  ALT 12 29  ALKPHOS 96 80  ALBUMIN 4.2 3.7   Recent Labs    06/01/23 0000 12/04/23 0000  WBC 9.4 9.0  NEUTROABS 6,025.00 5,184.00  HGB 14.6 14.5  HCT 45 44  PLT 342 330    Assessment and Plan: End of life care Spoke with pt's son Mujtaba Bollig him update. Will consult hospice for end-of-life care. Prn morphine  and  prn ativan  started. Will await hospice consult before stopping other non-essential medications.  Pt's son Chyrl states he will call his sister and give her update.  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 Pt is now at end of life.  DNR (do not resuscitate)/DNI(Do Not Intubate)  Pt is already DNR/DNI. Has MOST form completed with Comfort Measures checked off.    Camellia Door, DO [facility] [phone]      [1]  Allergies Allergen Reactions   Donepezil  Other (See Comments)    Increased confusion Still listed on pt's current med list as of 06/14/2021   Sulfites     Unable to recall reaction or specific sulfa drug
